# Patient Record
Sex: Female | Born: 1935 | Race: White | Hispanic: No | Marital: Married | Smoking: Never smoker
Health system: Southern US, Community
[De-identification: ages and names within clinical notes are randomized; demographics above are authoritative.]

## PROBLEM LIST (undated history)

## (undated) DIAGNOSIS — M1990 Unspecified osteoarthritis, unspecified site: Secondary | ICD-10-CM

## (undated) DIAGNOSIS — F329 Major depressive disorder, single episode, unspecified: Secondary | ICD-10-CM

## (undated) DIAGNOSIS — M545 Low back pain, unspecified: Secondary | ICD-10-CM

## (undated) DIAGNOSIS — I4891 Unspecified atrial fibrillation: Secondary | ICD-10-CM

## (undated) DIAGNOSIS — R011 Cardiac murmur, unspecified: Secondary | ICD-10-CM

## (undated) DIAGNOSIS — H269 Unspecified cataract: Secondary | ICD-10-CM

## (undated) DIAGNOSIS — D649 Anemia, unspecified: Secondary | ICD-10-CM

## (undated) DIAGNOSIS — I509 Heart failure, unspecified: Secondary | ICD-10-CM

## (undated) DIAGNOSIS — F32A Depression, unspecified: Secondary | ICD-10-CM

## (undated) DIAGNOSIS — Z5189 Encounter for other specified aftercare: Secondary | ICD-10-CM

## (undated) HISTORY — PX: FRACTURE SURGERY: SHX138

## (undated) HISTORY — PX: TONSILLECTOMY: SUR1361

## (undated) HISTORY — PX: ROTATOR CUFF REPAIR: SHX139

## (undated) HISTORY — DX: Major depressive disorder, single episode, unspecified: F32.9

## (undated) HISTORY — DX: Depression, unspecified: F32.A

## (undated) HISTORY — PX: APPENDECTOMY: SHX54

## (undated) HISTORY — DX: Cardiac murmur, unspecified: R01.1

## (undated) HISTORY — DX: Anemia, unspecified: D64.9

## (undated) HISTORY — DX: Encounter for other specified aftercare: Z51.89

## (undated) HISTORY — DX: Unspecified cataract: H26.9

## (undated) HISTORY — PX: EYE SURGERY: SHX253

## (undated) HISTORY — PX: CHOLECYSTECTOMY: SHX55

## (undated) HISTORY — DX: Unspecified osteoarthritis, unspecified site: M19.90

## (undated) HISTORY — DX: Heart failure, unspecified: I50.9

## (undated) HISTORY — PX: ABDOMINAL HYSTERECTOMY: SHX81

---

## 1999-10-03 ENCOUNTER — Encounter: Admission: RE | Admit: 1999-10-03 | Discharge: 1999-10-03 | Payer: Self-pay | Admitting: Orthopedic Surgery

## 1999-10-03 ENCOUNTER — Encounter: Payer: Self-pay | Admitting: Orthopedic Surgery

## 2000-06-05 ENCOUNTER — Encounter: Payer: Self-pay | Admitting: Specialist

## 2000-06-12 ENCOUNTER — Inpatient Hospital Stay (HOSPITAL_COMMUNITY): Admission: RE | Admit: 2000-06-12 | Discharge: 2000-06-14 | Payer: Self-pay | Admitting: Specialist

## 2000-08-04 ENCOUNTER — Emergency Department (HOSPITAL_COMMUNITY): Admission: EM | Admit: 2000-08-04 | Discharge: 2000-08-04 | Payer: Self-pay | Admitting: Internal Medicine

## 2005-01-17 ENCOUNTER — Emergency Department (HOSPITAL_COMMUNITY): Admission: EM | Admit: 2005-01-17 | Discharge: 2005-01-18 | Payer: Self-pay | Admitting: Emergency Medicine

## 2008-04-15 ENCOUNTER — Emergency Department (HOSPITAL_BASED_OUTPATIENT_CLINIC_OR_DEPARTMENT_OTHER): Admission: EM | Admit: 2008-04-15 | Discharge: 2008-04-15 | Payer: Self-pay | Admitting: Emergency Medicine

## 2008-04-17 ENCOUNTER — Emergency Department (HOSPITAL_BASED_OUTPATIENT_CLINIC_OR_DEPARTMENT_OTHER): Admission: EM | Admit: 2008-04-17 | Discharge: 2008-04-17 | Payer: Self-pay | Admitting: Emergency Medicine

## 2008-05-04 ENCOUNTER — Encounter: Admission: RE | Admit: 2008-05-04 | Discharge: 2008-05-04 | Payer: Self-pay | Admitting: Specialist

## 2008-05-12 ENCOUNTER — Encounter: Admission: RE | Admit: 2008-05-12 | Discharge: 2008-05-12 | Payer: Self-pay | Admitting: Specialist

## 2008-11-15 ENCOUNTER — Inpatient Hospital Stay (HOSPITAL_COMMUNITY): Admission: EM | Admit: 2008-11-15 | Discharge: 2008-11-20 | Payer: Self-pay | Admitting: Emergency Medicine

## 2008-11-15 HISTORY — PX: LEG SURGERY: SHX1003

## 2009-10-14 HISTORY — PX: JOINT REPLACEMENT: SHX530

## 2009-11-13 ENCOUNTER — Inpatient Hospital Stay (HOSPITAL_COMMUNITY): Admission: AD | Admit: 2009-11-13 | Discharge: 2009-11-17 | Payer: Self-pay | Admitting: Orthopedic Surgery

## 2010-12-30 LAB — TYPE AND SCREEN: ABO/RH(D): O POS

## 2010-12-30 LAB — ABO/RH: ABO/RH(D): O POS

## 2010-12-30 LAB — COMPREHENSIVE METABOLIC PANEL
ALT: 15 U/L (ref 0–35)
CO2: 28 mEq/L (ref 19–32)
Calcium: 9.5 mg/dL (ref 8.4–10.5)
Chloride: 105 mEq/L (ref 96–112)
GFR calc non Af Amer: 44 mL/min — ABNORMAL LOW (ref >60)
Glucose, Bld: 103 mg/dL — ABNORMAL HIGH (ref 70–99)
Sodium: 141 mEq/L (ref 135–145)
Total Bilirubin: 0.5 mg/dL (ref 0.3–1.2)

## 2010-12-30 LAB — PROTIME-INR
INR: 1.08 (ref 0.00–1.49)
Prothrombin Time: 23.6 seconds — ABNORMAL HIGH (ref 11.6–15.2)

## 2010-12-30 LAB — APTT: aPTT: 31 seconds (ref 24–37)

## 2010-12-30 LAB — CBC
Hemoglobin: 13 g/dL (ref 12.0–15.0)
MCHC: 33.4 g/dL (ref 30.0–36.0)
MCV: 90.3 fL (ref 78.0–100.0)
RBC: 4.31 MIL/uL (ref 3.87–5.11)
WBC: 7.8 10*3/uL (ref 4.0–10.5)

## 2010-12-30 LAB — URINALYSIS, ROUTINE W REFLEX MICROSCOPIC
Bilirubin Urine: NEGATIVE
Hgb urine dipstick: NEGATIVE
Nitrite: NEGATIVE
Specific Gravity, Urine: 1.012 (ref 1.005–1.030)
pH: 6 (ref 5.0–8.0)

## 2011-01-02 LAB — BASIC METABOLIC PANEL
BUN: 9 mg/dL (ref 6–23)
CO2: 31 mEq/L (ref 19–32)
Calcium: 8.3 mg/dL — ABNORMAL LOW (ref 8.4–10.5)
Calcium: 8.3 mg/dL — ABNORMAL LOW (ref 8.4–10.5)
Calcium: 8.3 mg/dL — ABNORMAL LOW (ref 8.4–10.5)
Chloride: 107 mEq/L (ref 96–112)
Creatinine, Ser: 1.09 mg/dL (ref 0.4–1.2)
GFR calc Af Amer: 58 mL/min — ABNORMAL LOW (ref >60)
GFR calc Af Amer: >60 mL/min (ref >60)
GFR calc Af Amer: >60 mL/min (ref >60)
GFR calc Af Amer: >60 mL/min (ref >60)
GFR calc non Af Amer: 48 mL/min — ABNORMAL LOW (ref >60)
GFR calc non Af Amer: 51 mL/min — ABNORMAL LOW (ref >60)
GFR calc non Af Amer: 51 mL/min — ABNORMAL LOW (ref >60)
GFR calc non Af Amer: 53 mL/min — ABNORMAL LOW (ref >60)
Glucose, Bld: 86 mg/dL (ref 70–99)
Potassium: 4 mEq/L (ref 3.5–5.1)
Potassium: 4.1 mEq/L (ref 3.5–5.1)
Potassium: 4.1 mEq/L (ref 3.5–5.1)
Potassium: 4.2 mEq/L (ref 3.5–5.1)
Sodium: 136 mEq/L (ref 135–145)
Sodium: 136 mEq/L (ref 135–145)
Sodium: 138 mEq/L (ref 135–145)

## 2011-01-02 LAB — CBC
HCT: 25 % — ABNORMAL LOW (ref 36.0–46.0)
HCT: 27.4 % — ABNORMAL LOW (ref 36.0–46.0)
HCT: 30.1 % — ABNORMAL LOW (ref 36.0–46.0)
HCT: 31.2 % — ABNORMAL LOW (ref 36.0–46.0)
Hemoglobin: 10 g/dL — ABNORMAL LOW (ref 12.0–15.0)
Hemoglobin: 10.8 g/dL — ABNORMAL LOW (ref 12.0–15.0)
Hemoglobin: 8.5 g/dL — ABNORMAL LOW (ref 12.0–15.0)
MCHC: 34.1 g/dL (ref 30.0–36.0)
MCV: 91.1 fL (ref 78.0–100.0)
Platelets: 214 10*3/uL (ref 150–400)
RBC: 2.76 MIL/uL — ABNORMAL LOW (ref 3.87–5.11)
RBC: 3.01 MIL/uL — ABNORMAL LOW (ref 3.87–5.11)
RBC: 3.43 MIL/uL — ABNORMAL LOW (ref 3.87–5.11)
RDW: 13.4 % (ref 11.5–15.5)
RDW: 13.8 % (ref 11.5–15.5)
WBC: 7.4 10*3/uL (ref 4.0–10.5)
WBC: 7.9 10*3/uL (ref 4.0–10.5)

## 2011-01-02 LAB — PROTIME-INR
INR: 1.16 (ref 0.00–1.49)
INR: 1.65 — ABNORMAL HIGH (ref 0.00–1.49)
Prothrombin Time: 17.2 seconds — ABNORMAL HIGH (ref 11.6–15.2)

## 2011-01-02 LAB — GLUCOSE, CAPILLARY: Glucose-Capillary: 120 mg/dL — ABNORMAL HIGH (ref 70–99)

## 2011-01-29 LAB — CBC
HCT: 38.7 % (ref 36.0–46.0)
Hemoglobin: 10.7 g/dL — ABNORMAL LOW (ref 12.0–15.0)
Hemoglobin: 12.8 g/dL (ref 12.0–15.0)
MCHC: 33.1 g/dL (ref 30.0–36.0)
MCHC: 33.2 g/dL (ref 30.0–36.0)
MCV: 88.7 fL (ref 78.0–100.0)
Platelets: 243 10*3/uL (ref 150–400)
RBC: 4.36 MIL/uL (ref 3.87–5.11)
RDW: 14.1 % (ref 11.5–15.5)
WBC: 13.1 10*3/uL — ABNORMAL HIGH (ref 4.0–10.5)

## 2011-01-29 LAB — PROTIME-INR
INR: 1.3 (ref 0.00–1.49)
INR: 1.4 (ref 0.00–1.49)
INR: 1.9 — ABNORMAL HIGH (ref 0.00–1.49)
INR: 2.4 — ABNORMAL HIGH (ref 0.00–1.49)
Prothrombin Time: 17.7 seconds — ABNORMAL HIGH (ref 11.6–15.2)
Prothrombin Time: 19.5 seconds — ABNORMAL HIGH (ref 11.6–15.2)
Prothrombin Time: 23.3 seconds — ABNORMAL HIGH (ref 11.6–15.2)
Prothrombin Time: 27.3 seconds — ABNORMAL HIGH (ref 11.6–15.2)

## 2011-01-29 LAB — URINALYSIS, ROUTINE W REFLEX MICROSCOPIC
Bilirubin Urine: NEGATIVE
Glucose, UA: NEGATIVE mg/dL
Hgb urine dipstick: NEGATIVE
Specific Gravity, Urine: 1.023 (ref 1.005–1.030)
Urobilinogen, UA: 0.2 mg/dL (ref 0.0–1.0)
pH: 5.5 (ref 5.0–8.0)

## 2011-01-29 LAB — BASIC METABOLIC PANEL
BUN: 8 mg/dL (ref 6–23)
CO2: 26 mEq/L (ref 19–32)
Calcium: 8.3 mg/dL — ABNORMAL LOW (ref 8.4–10.5)
Chloride: 104 mEq/L (ref 96–112)
Chloride: 104 mEq/L (ref 96–112)
Creatinine, Ser: 0.94 mg/dL (ref 0.4–1.2)
Creatinine, Ser: 1.18 mg/dL (ref 0.4–1.2)
GFR calc Af Amer: 53 mL/min — ABNORMAL LOW (ref >60)
Potassium: 3.9 mEq/L (ref 3.5–5.1)
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
## 2011-03-01 NOTE — Op Note (Signed)
[facility]  Patient:    Diane Mejia, Diane Mejia                      MRN: [id number] Proc. Date: 06/12/00 Adm. Date:  [id number] Disc. Date: [id number] Attending:  Gustavo Lah A                           Operative Report  PREOPERATIVE DIAGNOSES:  Massive rotator cuff tear, left shoulder.  POSTOPERATIVE DIAGNOSES:  Massive rotator cuff tear, left shoulder.  PROCEDURE:  Open rotator cuff repair, distal clavicle resection.  INDICATIONS FOR PROCEDURE:  This is a 77 year old female whose had a long standing rotator cuff tear and AC arthrosis. This was confirmed with MRI, disabling pain and dysfunction. Operative intervention was indicated for decompression in the subacromial joint, by distal resection and acromioplasty and repair of the rotator cuff tendon. This will likely require suture anchoring. The risks and benefits of the procedure were discussed including bleeding, infection, injury to neurovascular structures, recurrent tear, adhesive capsulitis, suboptimal range of motion, persistent pain, infection, need for hemiarthroplasty in the future, etc.  TECHNIQUE:  The patient supine in beach chair position after induction of adequate anesthesia and 1 gm of Kefzol IV for antimicrobial prophylaxis, the left shoulder and upper extremity was prepped and draped in the usual sterile fashion. An anterior incision over the mid point of the acromion was made along its line through the skin only and the subcutaneous tissue was dissected, electrocautery was utilized to achieve hemostasis. A raphe between the anterior and lateral heads of the deltoid was identified and developed anteriorly over the acromion approximately 3 cm over to the acromioclavicular joint. The capsule of the [facility] joint was identified, the capsule was divided and the distal end of the clavicle was skeletonized utilizing the AO elevator. Small retractors were utilized to retract beneath the distal  clavicle protecting the rotator cuff. It was significantly degenerated with inferior spurs. An oscillating saw was utilized to remove the distal centimeter of the clavicle with a bear rongeur utilized to complete the contour in the iliodistal clavicle. There is no residual impingement on the rotator cuff noted. The meniscus of the [facility] joint had been removed. Next, attention was turned towards the inferior aspect of the acromion. An inferior spur was noted here to and with a retractor protecting the rotator cuff, an oscillating saw was utilized to perform an anterior acromioplasty removing approximately 3 mm of the anterior surface of the acromion flush with the anterior aspect of the clavicle. This was after the CA ligament was detached. Inspection of the subacromial joint revealed hypertrophic bursa which was excised beneath which was noted a large fragmented retracted tear of the rotator cuff including the supraspinatus and infraspinatus that was retracted towards the glenohumeral joint. The glenohumeral joint was copiously irrigated as was the subacromial joint. Allis clamps were placed on the bleeding edge of the rotator cuff and the rotator cuff was digitally mobilized freeing it from anterior, posterior and medial adhesions. It was advanced to the lateral aspect of the humerus. Due to significant retraction and the paucity of the rotator cuff tendon, abduction of the extremity was required to provide satisfactory attachment of the rotator cuff tendon. The bleeding edge of the rotator cuff was debrided of good bleeding tissue. Next, a trough was made just medial to the greater tuberosity with a bear rongeur to good bleeding cancellous bone. A portion of the lateral  aspect of the articular surface had to be removed as well to provided an adequate bed for attachment to the rotator cuff tendon. The rotator cuff tendon was then advanced into the bed. This required three Mitek suture anchors  to secure the rotator cuff. These were impacted into the bone with excellent purchase. Pulling on the sutures produced no dislodgment of the anchor sutures. It was felt they were satisfactorily anchored into the bone. The rotator cuff was then advanced and then tied into the suture anchors with excellent surgical knots. Next, the rotator cuff and some residual stump was oversewn with #1 Vicryl interrupted figure-of-eight suture. Again this required abduction of the extremity to approximately 60 degrees. This took attention off the rotator cuff. There was no residual impingement upon the rotator cuff noted. The wound was copiously irrigated and water tight closure was obtained. Next, the capsule over the [facility] joint was repaired with #1 Vicryl in figure-of-eight sutures. The raphe was repaired with #1 Vicryl in interrupted figure-of-eight sutures through the raphe and on top of the acromion and repaired to the acromion with a #1 Vicryl interrupted figure-of-eight sutures. Excellent repair of the raphe was noted. The subcutaneous tissue was reapproximated with 2-0 Vicryl simple sutures, the skin was reapproximated with 4-0 subcuticular PDS. The wound was reinforced with Steri-Strips. The wound was dressed sterilely with the arm held in abduction and a large abduction pillow was placed and the arm was secured to this. This was held out in abduction at all times. Next, the patient was awakened without difficulty and transported to the recovery room in satisfactory condition.  The patient tolerated the procedure well with no complications. DD:  08/16/00 TD:  08/18/00 Job: [id number]

## 2011-03-01 NOTE — Discharge Summary (Signed)
[facility]  Patient:    Diane Mejia, Diane Mejia                      MRN: [id number] Adm. Date:  [id number] Disc. Date: [id number] Attending:  Pierce Crane Dictator:   Alexzandrew L. Perkins, P.A.-C.                           Discharge Summary  ADMISSION DIAGNOSES: 1. Torn rotator cuff. 2. Acromioclavicular arthrosis. 3. Hiatal hernia. 4. Remote history of asthma. 5. Postmenopausal. 6. Chronic bronchitis. 7. Kidney failure secondary to transfusion. 8. Rheumatic fever. 9. Heart murmur.  DISCHARGE DIAGNOSES: 1. Torn rotator cuff tear with acromioclavicular arthrosis status post open    repair rotator cuff with distal clavicle resection. 2. Hiatal hernia. 3. Remote history of asthma. 4. Postmenopausal. 5. Chronic bronchitis. 6. Kidney failure secondary to transfusion. 7. Rheumatic fever. 8. Heart murmur.  PROCEDURE:  The patient was taken to the OR on June 12, 2000, underwent an open repair of the torn rotator cuff with a distal clavicle resection.  SURGEON:  Dr. Jene Every.  ASSISTANT:  Avel Peace, PA-C.  ANESTHESIA:  General.  CONSULTS:  None.  ADMISSION HISTORY:  The patient is a 77 year old female followed by Dr. Jene Every for left shoulder pain.  She has been seen and evaluated and sent for an MRI which showed a full-thickness tear of the supraspinatus tendon as well as AC arthrosis, impingement secondary to clavicular pathology.  It was felt she would benefit from undergoing surgical intervention.  Risks and benefits of the procedure were discussed with the patient.  She elected to proceed with surgery.  LABORATORY AND X-RAY DATA:  H & H on admission showed hemoglobin of 12.8, hematocrit of 38.6.  PT/PTT on admission were 13.4 and 28 respectively.  BMET on admission:  Slightly elevated potassium of 5.2, elevated glucose of 121. Remaining BMET all within normal limits.  Urinalysis was negative.  Chest x-ray dated  June 05, 2000:  No active disease.  HOSPITAL COURSE:  The patient was [facility] to [facility], taken to OR, and underwent the above-stated procedure without complications.  The patient tolerated procedure well.  Later was transferred to recovery room and then to orthopedic floor to continue postoperative care.  The patient was also placed on p.o. and IV analgesics for pain control following surgery.  She did quite well.  By postoperative day #1, she was doing much better with the pain control.  She was placed into an abduction brace.  Occupational therapy was consulted to assist with ADLs and also adjustment of the brace.  Once she underwent occupational therapy she was discharged home.  DISCHARGE PLAN/DISPOSITION:  The patient is discharged home on June 13, 2000.  DISCHARGE MEDICATIONS: 1. Vicodin p.r.n. pain. 2. Vitamin C 500 mg p.o. q.d. over-the-counter. 3. Robaxin p.r.n. spasm.  DISCHARGE INSTRUCTIONS:  Diet:  As tolerated.  Activity:  Up as tolerated. Keep abduction pillow on at all times.  FOLLOWUP:  Follow up in 10-14 days following surgery.  Call the office for an appointment.  CONDITION ON DISCHARGE:  Improved. DD:  07/23/00 TD:  07/25/00 Job: [id number]

## 2011-06-05 ENCOUNTER — Other Ambulatory Visit (HOSPITAL_COMMUNITY): Payer: Self-pay | Admitting: Physical Medicine and Rehabilitation

## 2011-06-05 DIAGNOSIS — M25559 Pain in unspecified hip: Secondary | ICD-10-CM

## 2011-06-14 ENCOUNTER — Encounter (HOSPITAL_COMMUNITY): Payer: Self-pay

## 2011-06-14 ENCOUNTER — Encounter (HOSPITAL_COMMUNITY)
Admission: RE | Admit: 2011-06-14 | Discharge: 2011-06-14 | Disposition: A | Discharge status: Home / Self Care | Payer: Medicare Other | Source: Ambulatory Visit | Attending: Physical Medicine and Rehabilitation | Admitting: Physical Medicine and Rehabilitation

## 2011-06-14 DIAGNOSIS — M545 Low back pain, unspecified: Secondary | ICD-10-CM | POA: Insufficient documentation

## 2011-06-14 DIAGNOSIS — Z96659 Presence of unspecified artificial knee joint: Secondary | ICD-10-CM | POA: Insufficient documentation

## 2011-06-14 DIAGNOSIS — M4180 Other forms of scoliosis, site unspecified: Secondary | ICD-10-CM | POA: Insufficient documentation

## 2011-06-14 DIAGNOSIS — M47817 Spondylosis without myelopathy or radiculopathy, lumbosacral region: Secondary | ICD-10-CM | POA: Insufficient documentation

## 2011-06-14 DIAGNOSIS — M25559 Pain in unspecified hip: Secondary | ICD-10-CM

## 2011-06-14 HISTORY — DX: Low back pain: M54.5

## 2011-06-14 HISTORY — DX: Low back pain, unspecified: M54.50

## 2011-06-14 MED ORDER — TECHNETIUM TC 99M MEDRONATE IV KIT
24.0000 | PACK | Freq: Once | INTRAVENOUS | Status: AC | PRN
  Administered 2011-06-14: 24 via INTRAVENOUS

## 2011-11-14 DIAGNOSIS — I4891 Unspecified atrial fibrillation: Secondary | ICD-10-CM | POA: Diagnosis not present

## 2011-11-29 ENCOUNTER — Encounter (HOSPITAL_COMMUNITY): Payer: Self-pay | Admitting: Emergency Medicine

## 2011-11-29 ENCOUNTER — Observation Stay (HOSPITAL_COMMUNITY)
Admission: EM | Admit: 2011-11-29 | Discharge: 2011-12-01 | Disposition: A | Discharge status: Home / Self Care | Payer: Medicare Other | Attending: Internal Medicine | Admitting: Internal Medicine

## 2011-11-29 ENCOUNTER — Ambulatory Visit (INDEPENDENT_AMBULATORY_CARE_PROVIDER_SITE_OTHER): Payer: Medicare Other | Admitting: Family Medicine

## 2011-11-29 VITALS — BP 90/54 | HR 91 | Temp 97.4°F | Resp 22 | Ht 62.5 in | Wt 162.0 lb

## 2011-11-29 DIAGNOSIS — D649 Anemia, unspecified: Secondary | ICD-10-CM

## 2011-11-29 DIAGNOSIS — R059 Cough, unspecified: Secondary | ICD-10-CM

## 2011-11-29 DIAGNOSIS — I10 Essential (primary) hypertension: Secondary | ICD-10-CM | POA: Insufficient documentation

## 2011-11-29 DIAGNOSIS — R5381 Other malaise: Secondary | ICD-10-CM | POA: Diagnosis not present

## 2011-11-29 DIAGNOSIS — N390 Urinary tract infection, site not specified: Secondary | ICD-10-CM | POA: Insufficient documentation

## 2011-11-29 DIAGNOSIS — R0989 Other specified symptoms and signs involving the circulatory and respiratory systems: Secondary | ICD-10-CM | POA: Insufficient documentation

## 2011-11-29 DIAGNOSIS — I951 Orthostatic hypotension: Secondary | ICD-10-CM

## 2011-11-29 DIAGNOSIS — I4891 Unspecified atrial fibrillation: Secondary | ICD-10-CM | POA: Insufficient documentation

## 2011-11-29 DIAGNOSIS — R05 Cough: Secondary | ICD-10-CM

## 2011-11-29 DIAGNOSIS — F3289 Other specified depressive episodes: Secondary | ICD-10-CM | POA: Diagnosis not present

## 2011-11-29 DIAGNOSIS — M549 Dorsalgia, unspecified: Secondary | ICD-10-CM

## 2011-11-29 DIAGNOSIS — Z79899 Other long term (current) drug therapy: Secondary | ICD-10-CM | POA: Diagnosis not present

## 2011-11-29 DIAGNOSIS — Z7901 Long term (current) use of anticoagulants: Secondary | ICD-10-CM | POA: Diagnosis not present

## 2011-11-29 DIAGNOSIS — F329 Major depressive disorder, single episode, unspecified: Secondary | ICD-10-CM

## 2011-11-29 DIAGNOSIS — D509 Iron deficiency anemia, unspecified: Principal | ICD-10-CM | POA: Insufficient documentation

## 2011-11-29 DIAGNOSIS — R0609 Other forms of dyspnea: Secondary | ICD-10-CM | POA: Insufficient documentation

## 2011-11-29 DIAGNOSIS — F341 Dysthymic disorder: Secondary | ICD-10-CM | POA: Diagnosis not present

## 2011-11-29 DIAGNOSIS — R0602 Shortness of breath: Secondary | ICD-10-CM | POA: Diagnosis not present

## 2011-11-29 DIAGNOSIS — I498 Other specified cardiac arrhythmias: Secondary | ICD-10-CM | POA: Insufficient documentation

## 2011-11-29 DIAGNOSIS — R531 Weakness: Secondary | ICD-10-CM | POA: Diagnosis present

## 2011-11-29 DIAGNOSIS — F32A Depression, unspecified: Secondary | ICD-10-CM

## 2011-11-29 DIAGNOSIS — K219 Gastro-esophageal reflux disease without esophagitis: Secondary | ICD-10-CM | POA: Insufficient documentation

## 2011-11-29 HISTORY — DX: Unspecified atrial fibrillation: I48.91

## 2011-11-29 LAB — URINE MICROSCOPIC-ADD ON

## 2011-11-29 LAB — TSH: TSH: 1.684 u[IU]/mL (ref 0.350–4.500)

## 2011-11-29 LAB — URINALYSIS, ROUTINE W REFLEX MICROSCOPIC
Bilirubin Urine: NEGATIVE
Hgb urine dipstick: NEGATIVE
Nitrite: NEGATIVE
Protein, ur: NEGATIVE mg/dL
Specific Gravity, Urine: 1.021 (ref 1.005–1.030)
Urobilinogen, UA: 0.2 mg/dL (ref 0.0–1.0)

## 2011-11-29 LAB — COMPREHENSIVE METABOLIC PANEL
ALT: 9 U/L (ref 0–35)
AST: 15 U/L (ref 0–37)
Albumin: 3.9 g/dL (ref 3.5–5.2)
Alkaline Phosphatase: 61 U/L (ref 39–117)
BUN: 16 mg/dL (ref 6–23)
CO2: 26 mEq/L (ref 19–32)
Calcium: 9 mg/dL (ref 8.4–10.5)
Chloride: 105 mEq/L (ref 96–112)
Creat: 1.13 mg/dL — ABNORMAL HIGH (ref 0.50–1.10)
Glucose, Bld: 101 mg/dL — ABNORMAL HIGH (ref 70–99)
Potassium: 4.2 mEq/L (ref 3.5–5.3)
Sodium: 139 mEq/L (ref 135–145)
Total Bilirubin: 0.5 mg/dL (ref 0.3–1.2)
Total Protein: 6.5 g/dL (ref 6.0–8.3)

## 2011-11-29 LAB — POCT CBC
Granulocyte percent: 63.5 %G (ref 37–80)
HCT, POC: 25.3 % — AB (ref 37.7–47.9)
Hemoglobin: 7.2 g/dL — AB (ref 12.2–16.2)
Lymph, poc: 2.2 (ref 0.6–3.4)
MCH, POC: 21.1 pg — AB (ref 27–31.2)
MCHC: 28.5 g/dL — AB (ref 31.8–35.4)
MCV: 74.2 fL — AB (ref 80–97)
MID (cbc): 0.5 (ref 0–0.9)
MPV: 8.2 fL (ref 0–99.8)
POC Granulocyte: 4.8 (ref 2–6.9)
POC LYMPH PERCENT: 29.9 %L (ref 10–50)
POC MID %: 6.6 %M (ref 0–12)
Platelet Count, POC: 573 10*3/uL — AB (ref 142–424)
RBC: 3.41 M/uL — AB (ref 4.04–5.48)
RDW, POC: 18.7 %
WBC: 7.5 10*3/uL (ref 4.6–10.2)

## 2011-11-29 LAB — DIFFERENTIAL
Basophils Absolute: 0.1 10*3/uL (ref 0.0–0.1)
Basophils Relative: 1 % (ref 0–1)
Eosinophils Relative: 6 % — ABNORMAL HIGH (ref 0–5)
Lymphocytes Relative: 20 % (ref 12–46)
Neutro Abs: 4.8 10*3/uL (ref 1.7–7.7)

## 2011-11-29 LAB — PROTIME-INR
INR: 2.18 — ABNORMAL HIGH (ref 0.00–1.49)
Prothrombin Time: 24.6 seconds — ABNORMAL HIGH (ref 11.6–15.2)

## 2011-11-29 LAB — RETICULOCYTES
RBC.: 3.07 MIL/uL — ABNORMAL LOW (ref 3.87–5.11)
Retic Count, Absolute: 73.7 10*3/uL (ref 19.0–186.0)

## 2011-11-29 LAB — CBC
MCHC: 29.7 g/dL — ABNORMAL LOW (ref 30.0–36.0)
Platelets: 494 10*3/uL — ABNORMAL HIGH (ref 150–400)
RDW: 16.7 % — ABNORMAL HIGH (ref 11.5–15.5)
WBC: 7.3 10*3/uL (ref 4.0–10.5)

## 2011-11-29 LAB — OCCULT BLOOD, POC DEVICE: Fecal Occult Bld: NEGATIVE

## 2011-11-29 LAB — IRON AND TIBC
Iron: 11 ug/dL — ABNORMAL LOW (ref 42–135)
UIBC: 393 ug/dL (ref 125–400)

## 2011-11-29 LAB — BASIC METABOLIC PANEL
CO2: 27 mEq/L (ref 19–32)
Calcium: 9.4 mg/dL (ref 8.4–10.5)
Chloride: 102 mEq/L (ref 96–112)
GFR calc Af Amer: 52 mL/min — ABNORMAL LOW (ref >90)
Sodium: 138 mEq/L (ref 135–145)

## 2011-11-29 LAB — FOLATE: Folate: >20 ng/mL

## 2011-11-29 LAB — POCT SEDIMENTATION RATE: POCT SED RATE: 62 mm/hr — AB (ref 0–22)

## 2011-11-29 LAB — PREPARE RBC (CROSSMATCH)

## 2011-11-29 MED ORDER — ONDANSETRON HCL 4 MG PO TABS: 4.0000 mg | ORAL_TABLET | Freq: Four times a day (QID) | ORAL | Status: DC | PRN

## 2011-11-29 MED ORDER — SODIUM CHLORIDE 0.9 % IV SOLN: 250.0000 mL | INTRAVENOUS | Status: DC | PRN

## 2011-11-29 MED ORDER — POLYETHYLENE GLYCOL 3350 17 G PO PACK
17.0000 g | PACK | Freq: Every day | ORAL | Status: DC | PRN
  Filled 2011-11-29: qty 1

## 2011-11-29 MED ORDER — SODIUM CHLORIDE 0.9 % IJ SOLN
3.0000 mL | Freq: Two times a day (BID) | INTRAMUSCULAR | Status: DC
  Administered 2011-11-30 – 2011-12-01 (×2): 3 mL via INTRAVENOUS

## 2011-11-29 MED ORDER — DILTIAZEM HCL 90 MG PO TABS: 90.0000 mg | ORAL_TABLET | Freq: Every day | ORAL | Status: DC

## 2011-11-29 MED ORDER — CHOLECALCIFEROL 10 MCG (400 UNIT) PO TABS
400.0000 [IU] | ORAL_TABLET | Freq: Every day | ORAL | Status: DC
  Administered 2011-11-30 – 2011-12-01 (×2): 400 [IU] via ORAL
  Filled 2011-11-29 (×2): qty 1

## 2011-11-29 MED ORDER — HYDROCODONE-ACETAMINOPHEN 5-325 MG PO TABS: 1.0000 | ORAL_TABLET | ORAL | Status: DC | PRN

## 2011-11-29 MED ORDER — DILTIAZEM HCL 90 MG PO TABS
90.0000 mg | ORAL_TABLET | Freq: Every day | ORAL | Status: DC
  Administered 2011-11-30 – 2011-12-01 (×2): 90 mg via ORAL
  Filled 2011-11-29 (×2): qty 1

## 2011-11-29 MED ORDER — ONDANSETRON HCL 4 MG/2ML IJ SOLN: 4.0000 mg | Freq: Four times a day (QID) | INTRAMUSCULAR | Status: DC | PRN

## 2011-11-29 MED ORDER — HYDROCODONE-HOMATROPINE 5-1.5 MG/5ML PO SYRP: 5.0000 mL | ORAL_SOLUTION | Freq: Four times a day (QID) | ORAL | Status: DC | PRN

## 2011-11-29 MED ORDER — WARFARIN SODIUM 6 MG PO TABS
6.0000 mg | ORAL_TABLET | ORAL | Status: DC
  Administered 2011-11-30 (×2): 6 mg via ORAL
  Filled 2011-11-29 (×3): qty 1

## 2011-11-29 MED ORDER — WARFARIN SODIUM 6 MG PO TABS: 6.0000 mg | ORAL_TABLET | ORAL | Status: DC

## 2011-11-29 MED ORDER — SODIUM CHLORIDE 0.9 % IJ SOLN: 3.0000 mL | INTRAMUSCULAR | Status: DC | PRN

## 2011-11-29 MED ORDER — MIRTAZAPINE 7.5 MG PO TABS: 7.5000 mg | ORAL_TABLET | Freq: Every day | ORAL | Status: DC

## 2011-11-29 MED ORDER — WARFARIN SODIUM 3 MG PO TABS: 3.0000 mg | ORAL_TABLET | ORAL | Status: DC

## 2011-11-29 MED ORDER — ACETAMINOPHEN 325 MG PO TABS: 650.0000 mg | ORAL_TABLET | Freq: Four times a day (QID) | ORAL | Status: DC | PRN

## 2011-11-29 MED ORDER — FERROUS FUMARATE 325 (106 FE) MG PO TABS
1.0000 | ORAL_TABLET | Freq: Every day | ORAL | Status: DC
  Administered 2011-11-30 – 2011-12-01 (×2): 106 mg via ORAL
  Filled 2011-11-29 (×2): qty 1

## 2011-11-29 MED ORDER — SODIUM CHLORIDE 0.9 % IJ SOLN
3.0000 mL | Freq: Two times a day (BID) | INTRAMUSCULAR | Status: DC
  Administered 2011-11-30: 3 mL via INTRAVENOUS

## 2011-11-29 MED ORDER — MAGNESIUM OXIDE 400 MG PO TABS
200.0000 mg | ORAL_TABLET | Freq: Every day | ORAL | Status: DC
  Administered 2011-11-30 – 2011-12-01 (×2): 200 mg via ORAL
  Filled 2011-11-29 (×2): qty 0.5

## 2011-11-29 MED ORDER — MAGNESIUM 30 MG PO TABS: 30.0000 mg | ORAL_TABLET | Freq: Every day | ORAL | Status: DC

## 2011-11-29 MED ORDER — MIRTAZAPINE 7.5 MG PO TABS
7.5000 mg | ORAL_TABLET | Freq: Every day | ORAL | Status: DC
  Administered 2011-11-30 (×2): 7.5 mg via ORAL
  Filled 2011-11-29 (×3): qty 1

## 2011-11-29 MED ORDER — ADULT MULTIVITAMIN W/MINERALS CH
1.0000 | ORAL_TABLET | Freq: Every day | ORAL | Status: DC
  Administered 2011-11-30 – 2011-12-01 (×2): 1 via ORAL
  Filled 2011-11-29 (×2): qty 1

## 2011-11-29 MED ORDER — ACETAMINOPHEN 650 MG RE SUPP: 650.0000 mg | Freq: Four times a day (QID) | RECTAL | Status: DC | PRN

## 2011-11-29 MED ORDER — POTASSIUM CHLORIDE ER 8 MEQ PO TBCR
8.0000 meq | EXTENDED_RELEASE_TABLET | Freq: Once | ORAL | Status: AC
  Administered 2011-11-30: 8 meq via ORAL
  Filled 2011-11-29: qty 1

## 2011-11-29 NOTE — Progress Notes (Addendum)
78 yo woman with persistent back pain and, more concerning, orthostatic hypotension.  Cardiologist (Dr. Claris Gower) performed tilt test which was positive.  Back pain is treated by Dr. Lequita Halt.    Weakness has been persistent since September when she had a steroid injection.  It is persistent and worse when standing.  She has known spinal stenosis with scoliosis.  Associated with nausea.  Just feels wiped out and is very frustrated. Very depressed.  Appetite is diminished.  Not sleeping well.  O:  NAD, pale and appears depressed. HEENT:  Unremarkable Chest clear Heart reg, no Murmur or gallop Ext:  No sig edema Skin dry and warm BP 110/60 lying and sitting, 100/60 when standing  A:  Orthostatic hypotension, depression  P: check labs Reduce diltiazem to one at hs Remeron 7.5 qhs  Results for orders placed in visit on 11/29/11  POCT CBC      Component Value Range   WBC 7.5  4.6 - 10.2 (K/uL)   Lymph, poc 2.2  0.6 - 3.4    POC LYMPH PERCENT 29.9  10 - 50 (%L)   MID (cbc) 0.5  0 - 0.9    POC MID % 6.6  0 - 12 (%M)   POC Granulocyte 4.8  2 - 6.9    Granulocyte percent 63.5  37 - 80 (%G)   RBC 3.41 (*) 4.04 - 5.48 (M/uL)   Hemoglobin 7.2 (*) 12.2 - 16.2 (g/dL)   HCT, POC 11.9 (*) 14.7 - 47.9 (%)   MCV 74.2 (*) 80 - 97 (fL)   MCH, POC 21.1 (*) 27 - 31.2 (pg)   MCHC 28.5 (*) 31.8 - 35.4 (g/dL)   RDW, POC 82.9     Platelet Count, POC 573 (*) 142 - 424 (K/uL)   MPV 8.2  0 - 99.8 (fL)  POCT SEDIMENTATION RATE      Component Value Range   POCT SED RATE 62 (*) 0 - 22 (mm/hr)   Assessment:  This woman has chronic anemia but this is lower than usual by far.  The elevated sed rate and feeling horrible suggest that perhaps she has polymyalgia rheumatica.  At any rate, I directed her to [facility] ED for transfusion and further eval, and I spoke with the triage nurse there.

## 2011-11-29 NOTE — ED Provider Notes (Signed)
History     CSN: [id number]  Arrival date & time 11/29/11  1557   First MD Initiated Contact with Patient 11/29/11 1606      No chief complaint on file.   (Consider location/radiation/quality/duration/timing/severity/associated sxs/prior treatment) HPI Patient presents to the emergency room with complaints of weakness for the last few months. She states she feels like it all started back in September after she had some spinal steroid injections.  Patient has not had any chest pain, shortness of breath, fevers or chills. She states fatigue and weakness is all over and is general in nature. Patient went to an urgent care today and head blood tests that showed she was anemic. Patient has history of anemia but normally her hemoglobin she states is in the 10 or 11 range. Today at the urgent care her hemoglobin was down to 7. She was sent to the hospital for further treatment and blood transfusions. Past Medical History  Diagnosis Date  . Low back pain     No past surgical history on file.  No family history on file.  History  Substance Use Topics  . Smoking status: Never Smoker   . Smokeless tobacco: Not on file  . Alcohol Use: No    OB History    Grav Para Term Preterm Abortions TAB SAB Ect Mult Living                  Review of Systems  Respiratory: Negative for chest tightness and shortness of breath.   Gastrointestinal: Negative for blood in stool and anal bleeding.  All other systems reviewed and are negative.    Allergies  Sulfa antibiotics  Home Medications   Current Outpatient Rx  Name Route Sig Dispense Refill  . CHOLECALCIFEROL 400 UNITS PO TABS Oral Take 400 Units by mouth daily.    Marland Kitchen DILTIAZEM HCL 90 MG PO TABS Oral Take 1 tablet (90 mg total) by mouth daily. 180 tablet 6  . FERROUS FUMARATE 325 (106 FE) MG PO TABS Oral Take 1 tablet by mouth daily.    Marland Kitchen HYDROCODONE-HOMATROPINE 5-1.5 MG/5ML PO SYRP Oral Take 5 mLs by mouth every 6 (six) hours as needed for  cough. 120 mL 0  . MAGNESIUM 30 MG PO TABS Oral Take 30 mg by mouth daily.    Marland Kitchen MIRTAZAPINE 7.5 MG PO TABS Oral Take 1 tablet (7.5 mg total) by mouth at bedtime. 30 tablet 6  . ONE-DAILY MULTI VITAMINS PO TABS Oral Take 1 tablet by mouth daily.    Marland Kitchen POTASSIUM CHLORIDE ER 8 MEQ PO TBCR Oral Take 8 mEq by mouth as needed.    . WARFARIN SODIUM 6 MG PO TABS Oral Take 6 mg by mouth as directed.      BP 129/50  Pulse 84  Temp(Src) 97.7 F (36.5 C) (Oral)  Resp 16  SpO2 98%  Physical Exam  Nursing note and vitals reviewed. Constitutional: She appears well-developed and well-nourished. No distress.  HENT:  Head: Normocephalic and atraumatic.  Right Ear: External ear normal.  Left Ear: External ear normal.       Conjunctiva pale  Eyes: Right eye exhibits no discharge. Left eye exhibits no discharge. No scleral icterus.  Neck: Neck supple. No tracheal deviation present.  Cardiovascular: Normal rate, regular rhythm and intact distal pulses.   Pulmonary/Chest: Effort normal and breath sounds normal. No stridor. No respiratory distress. She has no wheezes. She has no rales.  Abdominal: Soft. Bowel sounds are normal. She exhibits no  distension. There is no tenderness. There is no rebound and no guarding.  Genitourinary: Rectal exam shows no mass and anal tone normal. Guaiac negative stool.  Musculoskeletal: She exhibits no edema and no tenderness.  Neurological: She is alert. She has normal strength. No sensory deficit. Cranial nerve deficit:  no gross defecits noted. She exhibits normal muscle tone. She displays no seizure activity. Coordination normal.  Skin: Skin is warm and dry. No rash noted.  Psychiatric: She has a normal mood and affect.    ED Course  Procedures (including critical care time)   Labs Reviewed  CBC - Abnormal; Notable for the following:    RBC 3.06 (*)    Hemoglobin 7.0 (*)    HCT 23.6 (*)    MCV 77.1 (*)    MCH 22.9 (*)    MCHC 29.7 (*)    RDW 16.7 (*)     Platelets 494 (*)    All other components within normal limits  DIFFERENTIAL - Abnormal; Notable for the following:    Eosinophils Relative 6 (*)    All other components within normal limits  BASIC METABOLIC PANEL - Abnormal; Notable for the following:    Glucose, Bld 127 (*)    GFR calc non Af Amer 45 (*)    GFR calc Af Amer 52 (*)    All other components within normal limits  PROTIME-INR - Abnormal; Notable for the following:    Prothrombin Time 24.6 (*) COUMADIN THERAPY   INR 2.18 (*)    All other components within normal limits  TYPE AND SCREEN  OCCULT BLOOD, POC DEVICE  URINALYSIS, ROUTINE W REFLEX MICROSCOPIC  PREPARE RBC (CROSSMATCH)   No results found.   1. Anemia       MDM  Patient appears to have worsening symptomatic anemia. At this time there is no evidence of acute gastrointestinal bleeding. She remains hemodynamically stable. I have ordered a blood transfusion and we'll consult the hospitalist for admission and further evaluation.  CRITICAL CARE Performed by: Celene Kras   Total critical care time: 30  Critical care time was exclusive of separately billable procedures and treating other patients.  Critical care was necessary to treat or prevent imminent or life-threatening deterioration.  Critical care was time spent personally by me on the following activities: development of treatment plan with patient and/or surrogate as well as nursing, discussions with consultants, evaluation of patient's response to treatment, examination of patient, obtaining history from patient or surrogate, ordering and performing treatments and interventions, ordering and review of laboratory studies, ordering and review of radiographic studies, pulse oximetry and re-evaluation of patient's condition.        Celene Kras, MD 11/29/11 [phone]

## 2011-11-29 NOTE — H&P (Addendum)
Hospital Admission Note Date: 11/29/2011  PCP: Elvina Sidle, MD, MD  Chief Complaint:Dyspnea  History of Present Illness:  This is an 78 year-old female with past medical history of osteoarthritis, rheumatic fever at the age of 4, paroxysmal A. fib on chronic Coumadin, postoperative blood loss anemia and a history of iron deficiency anemia that comes in for dyspnea. She relates this started 2 weeks prior to admission. And has progressively been getting worse. She went to her primary care doctor who drew labs. And called her and said that her hemoglobin was low. So she had to come to the EEG. The patient relates that her shortness of breath has progressively been getting worse with exertion. She is also in relating some weakness and loss of energy for the last week. She feels like she just cannot start going. She relates no melena, bright red blood, no hematemesis. She has a gastroenterologist [facility] at [facility], her last colonoscopy was 5 years ago and everything was within normal as per patient. Here in the ED a CBC was drawn and showed a hemoglobin of 7.0 with her last recorded hemoglobin 2011 of 10. So we were asked to admit and further evaluate. Allergies: Sulfa antibiotics Past Medical History  Diagnosis Date  . Low back pain    Prior to Admission medications   Medication Sig Start Date End Date Taking? Authorizing Provider  cholecalciferol (VITAMIN D) 400 UNITS TABS Take 400 Units by mouth daily.   Yes Historical Provider, MD  diltiazem (CARDIZEM) 90 MG tablet Take 1 tablet (90 mg total) by mouth daily. 11/29/11 11/28/12 Yes Elvina Sidle, MD  ferrous fumarate (HEMOCYTE - 106 MG FE) 325 (106 FE) MG TABS Take 1 tablet by mouth daily.   Yes Historical Provider, MD  magnesium 30 MG tablet Take 30 mg by mouth daily.   Yes Historical Provider, MD  Multiple Vitamin (MULTIVITAMIN) tablet Take 1 tablet by mouth daily.   Yes Historical Provider, MD  potassium chloride (KLOR-CON)  8 MEQ tablet Take 8 mEq by mouth as needed.   Yes Historical Provider, MD  warfarin (COUMADIN) 6 MG tablet Take 6 mg by mouth as directed. Takes 6mg  every day except Tuesday then she takes 3mg  on that day.   Yes Historical Provider, MD  HYDROcodone-homatropine (HYDROMET) 5-1.5 MG/5ML syrup Take 5 mLs by mouth every 6 (six) hours as needed for cough. 11/29/11 12/09/11  Elvina Sidle, MD  mirtazapine (REMERON) 7.5 MG tablet Take 1 tablet (7.5 mg total) by mouth at bedtime. 11/29/11 12/29/11  Elvina Sidle, MD   History reviewed. No pertinent past surgical history. History reviewed. No pertinent family history. History   Social History  . Marital Status: Married    Spouse Name: N/A    Number of Children: N/A  . Years of Education: N/A   Occupational History  . Not on file.   Social History Main Topics  . Smoking status: Never Smoker   . Smokeless tobacco: Not on file  . Alcohol Use: No  . Drug Use: No  . Sexually Active: No   Other Topics Concern  . Not on file   Social History Narrative  . No narrative on file    REVIEW OF SYSTEMS:  Constitutional:  No weight loss, night sweats, Fevers, chills, fatigue.  HEENT:  No headaches, Difficulty swallowing,Tooth/dental problems,Sore throat,  No sneezing, itching, ear ache, nasal congestion, post nasal drip,  Cardio-vascular:  No chest pain, Orthopnea, PND, swelling in lower extremities, anasarca, dizziness, palpitations  GI:  No heartburn, indigestion, abdominal pain, nausea, vomiting, diarrhea, change in bowel habits, loss of appetite  Resp:  No shortness of breath with exertion or at rest. No excess mucus, no productive cough, No non-productive cough, No coughing up of blood.No change in color of mucus.No wheezing.No chest wall deformity  Skin:  no rash or lesions.  GU:  no dysuria, change in color of urine, no urgency or frequency. No flank pain.  Musculoskeletal:  No joint pain or swelling. No decreased range of motion. No  back pain.  Psych:  No change in mood or affect. No depression or anxiety. No memory loss.   Physical Exam: Filed Vitals:   11/29/11 1615  BP: 129/50  Pulse: 84  Temp: 97.7 F (36.5 C)  TempSrc: Oral  Resp: 16  SpO2: 98%   No intake or output data in the 24 hours ending 11/29/11 1835 BP 129/50  Pulse 84  Temp(Src) 97.7 F (36.5 C) (Oral)  Resp 16  SpO2 98%  General Appearance:    Alert, cooperative, no distress, appears stated age  Head:    Normocephalic, without obvious abnormality, atraumatic  Eyes:    PERRL, conjunctiva/corneas clear, EOM's intact, fundi    benign, both eyes  Ears:    Normal TM's and external ear canals, both ears  Nose:   Nares normal, septum midline, mucosa normal, no drainage    or sinus tenderness  Throat:   Lips, mucosa, and tongue normal; teeth and gums normal  Neck:   Supple, symmetrical, trachea midline, no adenopathy;    thyroid:  no enlargement/tenderness/nodules; no carotid   bruit or JVD  Back:     Symmetric, no curvature, ROM normal, no CVA tenderness  Lungs:     Clear to auscultation bilaterally, respirations unlabored  Chest Wall:    No tenderness or deformity   Heart:    Regular rate and rhythm, S1 and S2 normal, no murmur, rub   or gallop     Abdomen:     Soft, non-tender, bowel sounds active all four quadrants,    no masses, no organomegaly        Extremities:   Extremities normal, atraumatic, no cyanosis or edema  Pulses:   2+ and symmetric all extremities  Skin:   Skin color, texture, turgor normal, no rashes or lesions  Lymph nodes:   Cervical, supraclavicular, and axillary nodes normal  Neurologic:   CNII-XII intact, normal strength, sensation and reflexes    throughout   Lab results:  Basename 11/29/11 1655  NA 138  K 3.8  CL 102  CO2 27  GLUCOSE 127*  BUN 18  CREATININE 1.08  CALCIUM 9.4  MG --  PHOS --   No results found for this basename: AST:2,ALT:2,ALKPHOS:2,BILITOT:2,PROT:2,ALBUMIN:2 in the last 72  hours No results found for this basename: LIPASE:2,AMYLASE:2 in the last 72 hours  Basename 11/29/11 1655 11/29/11 1407  WBC 7.3 7.5  NEUTROABS 4.8 --  HGB 7.0* 7.2*  HCT 23.6* 25.3*  MCV 77.1* 74.2*  PLT 494* --   No results found for this basename: CKTOTAL:3,CKMB:3,CKMBINDEX:3,TROPONINI:3 in the last 72 hours No components found with this basename: POCBNP:3 No results found for this basename: DDIMER:2 in the last 72 hours No results found for this basename: HGBA1C:2 in the last 72 hours No results found for this basename: CHOL:2,HDL:2,LDLCALC:2,TRIG:2,CHOLHDL:2,LDLDIRECT:2 in the last 72 hours No results found for this basename: TSH,T4TOTAL,FREET3,T3FREE,THYROIDAB in the last 72 hours No results found for this basename: VITAMINB12:2,FOLATE:2,FERRITIN:2,TIBC:2,IRON:2,RETICCTPCT:2 in the last 72 hours  Imaging results:  No results found. Other results: EKG: Marland Kitchen   Patient Active Hospital Problem List:  Dyspnea (11/29/2011) -likely cause of her dyspnea which has progressively been getting worse over 2 weeks is her iron deficiency anemia. We'll check an anemia panel, type and cross 2 units transfuse 1 unit check a CBC, if her symptoms are not resolved, will transfuse her an additional unit and check a CBC afterwards.   Hypertension (11/29/2011)  continue current home meds no changes.  Iron deficiency anemia (11/29/2011) Her MCV is low which is most likely indicative of chronic ongoing loss we'll guaiac her stools. We'll check an anemia panel. She's currently on iron supplements. We'll continue this. I will go ahead and just use her one unit and check a CBC tomorrow morning. She has a gastroenterologist over at [facility], her last colonoscopy was 5 years ago. She can followup with him as an outpatient if she has no obvious signs of bleeding.  Atrial fibrillation (11/29/2011)  rate control continue current meds INR therapeutic hold Coumadin.    Code Status: Full code Family  Communication: Spouse [phone]   Marinda Elk M.D. [facility] [phone] 11/29/2011, 6:35 PM

## 2011-11-29 NOTE — ED Notes (Signed)
MD at bedside. Hospitalist

## 2011-11-29 NOTE — ED Notes (Signed)
Pt attempted to give a urine specimen, but could not void.  Stated that she has not had anything to drink since early this morning.

## 2011-11-29 NOTE — Patient Instructions (Signed)
Recheck in 2 weeks.  Lab tests are pending  Reduce the diltiazem to just one at bedtime once a day  Start the Remeron (mirtazapine)

## 2011-11-29 NOTE — ED Notes (Signed)
Pt admitted to E 1607

## 2011-11-29 NOTE — ED Notes (Addendum)
Pt informed of urinalysis order.  Stated that she can not void at this time.  Also, stated that her husband(an ex-Lab tech) checks her urine at home.

## 2011-11-29 NOTE — ED Notes (Signed)
States has been feeling weak for some time. Went to MD found Hgb 7.0 per pt.

## 2011-11-30 DIAGNOSIS — D649 Anemia, unspecified: Secondary | ICD-10-CM | POA: Diagnosis not present

## 2011-11-30 DIAGNOSIS — I10 Essential (primary) hypertension: Secondary | ICD-10-CM | POA: Diagnosis not present

## 2011-11-30 DIAGNOSIS — R531 Weakness: Secondary | ICD-10-CM | POA: Diagnosis present

## 2011-11-30 DIAGNOSIS — R0602 Shortness of breath: Secondary | ICD-10-CM | POA: Diagnosis not present

## 2011-11-30 DIAGNOSIS — I4891 Unspecified atrial fibrillation: Secondary | ICD-10-CM | POA: Diagnosis not present

## 2011-11-30 LAB — COMPREHENSIVE METABOLIC PANEL
ALT: 7 U/L (ref 0–35)
AST: 17 U/L (ref 0–37)
Alkaline Phosphatase: 62 U/L (ref 39–117)
CO2: 28 mEq/L (ref 19–32)
Calcium: 9 mg/dL (ref 8.4–10.5)
Chloride: 105 mEq/L (ref 96–112)
GFR calc Af Amer: 56 mL/min — ABNORMAL LOW (ref >90)
GFR calc non Af Amer: 48 mL/min — ABNORMAL LOW (ref >90)
Glucose, Bld: 82 mg/dL (ref 70–99)
Potassium: 3.6 mEq/L (ref 3.5–5.1)
Sodium: 139 mEq/L (ref 135–145)

## 2011-11-30 LAB — CBC
Hemoglobin: 7.9 g/dL — ABNORMAL LOW (ref 12.0–15.0)
MCH: 23.5 pg — ABNORMAL LOW (ref 26.0–34.0)
Platelets: 447 10*3/uL — ABNORMAL HIGH (ref 150–400)
RBC: 3.36 MIL/uL — ABNORMAL LOW (ref 3.87–5.11)
WBC: 7.6 10*3/uL (ref 4.0–10.5)

## 2011-11-30 LAB — IRON AND TIBC
Saturation Ratios: 8 % — ABNORMAL LOW (ref 20–55)
TIBC: 368 ug/dL (ref 250–470)

## 2011-11-30 LAB — RETICULOCYTES: RBC.: 3.36 MIL/uL — ABNORMAL LOW (ref 3.87–5.11)

## 2011-11-30 LAB — PROTIME-INR: INR: 2.04 — ABNORMAL HIGH (ref 0.00–1.49)

## 2011-11-30 LAB — FERRITIN: Ferritin: 4 ng/mL — ABNORMAL LOW (ref 10–291)

## 2011-11-30 NOTE — Progress Notes (Signed)
Patient ID: Diane Mejia, female   DOB: [birth year], 78 y.o.   MRN: [id number]  Subjective: No events overnight. Patient denies chest pain, shortness of breath, abdominal pain.   Objective:  Vital signs in last 24 hours:  Filed Vitals:   11/30/11 1243 11/30/11 1330 11/30/11 1530 11/30/11 1700  BP: 115/58 107/71 114/50 117/73  Pulse: 85 77 68 82  Temp: 98 F (36.7 C) 98.1 F (36.7 C) 98.6 F (37 C) 97.8 F (36.6 C)  TempSrc: Oral Oral Oral Oral  Resp: 16 16 16 16   Height:      Weight:      SpO2:        Intake/Output from previous day:   Intake/Output Summary (Last 24 hours) at 11/30/11 2207 Last data filed at 11/30/11 1700  Gross per 24 hour  Intake 1526.33 ml  Output      0 ml  Net 1526.33 ml    Physical Exam: General: Alert, awake, oriented x3, in no acute distress. HEENT: No bruits, no goiter. Moist mucous membranes, no scleral icterus, no conjunctival pallor. Heart: Irregular rate and rhythm, no murmurs, rubs, gallops. Lungs: Clear to auscultation bilaterally. No wheezing, no rhonchi, no rales.  Abdomen: Soft, nontender, nondistended, positive bowel sounds. Extremities: No clubbing or cyanosis, no pitting edema,  positive pedal pulses. Neuro: Grossly nonfocal.  Lab Results:  Basic Metabolic Panel:    Component Value Date/Time   NA 139 11/30/2011 0441   K 3.6 11/30/2011 0441   CL 105 11/30/2011 0441   CO2 28 11/30/2011 0441   BUN 14 11/30/2011 0441   CREATININE 1.02 11/30/2011 0441   CREATININE 1.13* 11/29/2011 1356   GLUCOSE 82 11/30/2011 0441   CALCIUM 9.0 11/30/2011 0441   CBC:    Component Value Date/Time   WBC 7.6 11/30/2011 0441   WBC 7.5 11/29/2011 1407   HGB 8.9* 11/30/2011 1954   HGB 7.2* 11/29/2011 1407   HCT 29.0* 11/30/2011 1954   HCT 25.3* 11/29/2011 1407   PLT 447* 11/30/2011 0441   MCV 78.0 11/30/2011 0441   MCV 74.2* 11/29/2011 1407   NEUTROABS 4.8 11/29/2011 1655   LYMPHSABS 1.5 11/29/2011 1655   MONOABS 0.5 11/29/2011 1655   EOSABS 0.4 11/29/2011  1655   BASOSABS 0.1 11/29/2011 1655      Lab 11/30/11 1954 11/30/11 0441 11/29/11 1655 11/29/11 1407  WBC -- 7.6 7.3 7.5  HGB 8.9* 7.9* 7.0* 7.2*  HCT 29.0* 26.2* 23.6* 25.3*  PLT -- 447* 494* --  MCV -- 78.0 77.1* 74.2*  MCH -- 23.5* 22.9* 21.1*  MCHC -- 30.2 29.7* 28.5*  RDW -- 16.7* 16.7* --  LYMPHSABS -- -- 1.5 --  MONOABS -- -- 0.5 --  EOSABS -- -- 0.4 --  BASOSABS -- -- 0.1 --  BANDABS -- -- -- --    Lab 11/30/11 0441 11/29/11 1655 11/29/11 1356  NA 139 138 139  K 3.6 3.8 4.2  CL 105 102 105  CO2 28 27 26   GLUCOSE 82 127* 101*  BUN 14 18 16   CREATININE 1.02 1.08 1.13*  CALCIUM 9.0 9.4 9.0  MG -- -- --    Lab 11/30/11 0441 11/29/11 1655  INR 2.04* 2.18*  PROTIME -- --   Studies/Results: No results found.  Medications: Scheduled Meds:   . cholecalciferol  400 Units Oral Daily  . diltiazem  90 mg Oral Daily  . ferrous fumarate  1 tablet Oral Daily  . magnesium oxide  200 mg Oral Daily  .  mirtazapine  7.5 mg Oral QHS  . mulitivitamin with minerals  1 tablet Oral Daily  . potassium chloride  8 mEq Oral Once  . sodium chloride  3 mL Intravenous Q12H  . sodium chloride  3 mL Intravenous Q12H  . warfarin  3 mg Oral Custom  . warfarin  6 mg Oral Custom  . DISCONTD: magnesium  30 mg Oral Daily  . DISCONTD: warfarin  6 mg Oral UD   Continuous Infusions:  PRN Meds:.sodium chloride, sodium chloride, acetaminophen, acetaminophen, HYDROcodone-acetaminophen, HYDROcodone-homatropine, ondansetron (ZOFRAN) IV, ondansetron, polyethylene glycol, sodium chloride, sodium chloride  Assessment/Plan:  Principal Problem:  *Weakness - most likely secondary to progressive anemia of unclear etiology - her baseline Hg appears to be 9 -10 and she was admitted with Hg of 7.2 - her was colonoscopy was 5 years ago and she reports it was normal - plan is to proceed with second unit of blood transfusion and obtain follow up CBC - may need GI consult if numbers noted to trend  down  Active Problems:  Hypertension - remains stable    Iron deficiency anemia - please see primary problem   Atrial fibrillation - rate controlled on Cardizem - will continue Coumadin   EDUCATION - test results and diagnostic studies were discussed with patient and pt's family who was present at the bedside (huband) - patient and family have verbalized the understanding - questions were answered at the bedside and contact information was provided for additional questions or concerns   LOS: 1 day   MAGICK-Kayly Kriegel 11/30/2011, 10:07 PM  [facility] Pager: [phone]

## 2011-12-01 DIAGNOSIS — I4891 Unspecified atrial fibrillation: Secondary | ICD-10-CM | POA: Diagnosis not present

## 2011-12-01 DIAGNOSIS — R0602 Shortness of breath: Secondary | ICD-10-CM | POA: Diagnosis not present

## 2011-12-01 DIAGNOSIS — I10 Essential (primary) hypertension: Secondary | ICD-10-CM | POA: Diagnosis not present

## 2011-12-01 DIAGNOSIS — D649 Anemia, unspecified: Secondary | ICD-10-CM | POA: Diagnosis not present

## 2011-12-01 LAB — CBC
HCT: 28.3 % — ABNORMAL LOW (ref 36.0–46.0)
Hemoglobin: 8.7 g/dL — ABNORMAL LOW (ref 12.0–15.0)
MCH: 24.6 pg — ABNORMAL LOW (ref 26.0–34.0)
MCHC: 30.7 g/dL (ref 30.0–36.0)
MCV: 79.9 fL (ref 78.0–100.0)

## 2011-12-01 LAB — PROTIME-INR: Prothrombin Time: 23.2 seconds — ABNORMAL HIGH (ref 11.6–15.2)

## 2011-12-01 LAB — BASIC METABOLIC PANEL
BUN: 20 mg/dL (ref 6–23)
Calcium: 8.4 mg/dL (ref 8.4–10.5)
Creatinine, Ser: 1.09 mg/dL (ref 0.50–1.10)
GFR calc non Af Amer: 45 mL/min — ABNORMAL LOW (ref >90)
Glucose, Bld: 102 mg/dL — ABNORMAL HIGH (ref 70–99)

## 2011-12-01 LAB — URINE MICROSCOPIC-ADD ON

## 2011-12-01 LAB — URINALYSIS, ROUTINE W REFLEX MICROSCOPIC
Protein, ur: NEGATIVE mg/dL
Urobilinogen, UA: 0.2 mg/dL (ref 0.0–1.0)

## 2011-12-01 MED ORDER — CIPROFLOXACIN HCL 250 MG PO TABS: 250.0000 mg | ORAL_TABLET | Freq: Two times a day (BID) | ORAL | Status: AC

## 2011-12-01 MED ORDER — CIPROFLOXACIN HCL 250 MG PO TABS
250.0000 mg | ORAL_TABLET | Freq: Two times a day (BID) | ORAL | Status: DC
  Administered 2011-12-01: 250 mg via ORAL
  Filled 2011-12-01 (×2): qty 1

## 2011-12-01 NOTE — Progress Notes (Signed)
Pt stable, scripts, and d/c instructions given.  No questions/concerns voiced by patient.  Transported via wheelchair to private vehicle with NT and family.

## 2011-12-01 NOTE — Discharge Summary (Signed)
Physician Discharge Summary  Patient ID: Diane Mejia MRN: [id number] DOB/AGE: [birth year] 78 y.o.  Admit date: 11/29/2011 Discharge date: 12/01/2011  Primary Care Physician:  Elvina Sidle, MD, MD  Discharge Diagnoses:   Present on Admission:  .Hypertension .Iron deficiency anemia .Atrial fibrillation .Dyspnea .Weakness Urinary tract infection  Consults:  None   Discharge Medications: Medication List  As of 12/01/2011  2:46 PM   TAKE these medications         cholecalciferol 400 UNITS Tabs   Commonly known as: VITAMIN D   Take 400 Units by mouth daily.      ciprofloxacin 250 MG tablet   Commonly known as: CIPRO   Take 1 tablet (250 mg total) by mouth 2 (two) times daily.      diltiazem 90 MG tablet   Commonly known as: CARDIZEM   Take 1 tablet (90 mg total) by mouth daily.      ferrous fumarate 325 (106 FE) MG Tabs   Commonly known as: HEMOCYTE - 106 mg FE   Take 1 tablet by mouth daily.      HYDROcodone-homatropine 5-1.5 MG/5ML syrup   Commonly known as: HYCODAN   Take 5 mLs by mouth every 6 (six) hours as needed for cough.      magnesium 30 MG tablet   Take 30 mg by mouth daily.      mirtazapine 7.5 MG tablet   Commonly known as: REMERON   Take 1 tablet (7.5 mg total) by mouth at bedtime.      multivitamin tablet   Take 1 tablet by mouth daily.      potassium chloride 8 MEQ tablet   Commonly known as: KLOR-CON   Take 8 mEq by mouth as needed.      warfarin 6 MG tablet   Commonly known as: COUMADIN   Take 6 mg by mouth as directed. Takes 6mg  every day except Tuesday then she takes 3mg  on that day.             Brief H and P: For complete details please refer to admission H and P, but in brief patient is a 78 year old female with past medical history of ostial arthritis, rheumatic fever, paroxysmal A. fib on chronic Coumadin presented to the emergency room with history of dyspnea. Patient stated that this started 2 weeks prior to admission and had  been progressively been getting worse. She also related some generalized weakness, loss of energy for the last 1 week but no melena bright red blood or hematemesis. She has gastroenterologist, Dr. Noelle Penner, at [facility], last colonoscopy was 5 years ago and per patient was normal.  Hospital Course:    *Weakness: Likely secondary to iron deficiency anemia, also patient was noted to have urinary tract infection. - Baseline hemoglobin appears to be 9-10., Patient was transfused and at the time of discharge hemoglobin A1c 8.7. Stool occult test was negative. Anemia panel was checked and showed a significant iron deficiency. I did offer her recommendations for GI consultation. The patient has gastroenterologist, Dr. Noelle Penner at [facility], [place] and preferred to have GI evaluation/workup with Dr. Noelle Penner outpatient. Patient's husband at the bedside also showed that they will followup with Dr. Noelle Penner office on Monday tomorrow to schedule an appointment. Patient was recommended to continue iron supplementation.  UTI: The patient was also found to have dirty urine sample on the admission. She did not have any symptoms of dysuria or hematuria, was started on 3 day course of ciprofloxacin. Urine  culture sample was also obtained prior to the discharge for sensitivities.    Hypertension: Remained stable  Iron deficiency anemia: Management as problem #1, patient was also transfused pRBC's  Atrial fibrillation: Remained stable, patient was continued on Coumadin   Day of Discharge BP 121/68  Pulse 64  Temp(Src) 97.8 F (36.6 C) (Oral)  Resp 16  Ht 5' 2.5" (1.588 m)  Wt 73.483 kg (162 lb)  BMI 29.16 kg/m2  SpO2 96%  Physical Exam: General: Alert and awake oriented x3 not in any acute distress. HEENT: anicteric sclera, pupils reactive to light and accommodation CVS: S1-S2 clear no murmur rubs or gallops Chest: clear to auscultation bilaterally, no wheezing rales or rhonchi Abdomen: soft  nontender, nondistended, normal bowel sounds, no organomegaly Extremities: no cyanosis, clubbing or edema noted bilaterally Neuro: Cranial nerves II-XII intact, no focal neurological deficits   The results of significant diagnostics from this hospitalization (including imaging, microbiology, ancillary and laboratory) are listed below for reference.    LAB RESULTS: Basic Metabolic Panel:  Lab 12/01/11 1610 11/30/11 0441  NA 139 139  K 3.6 3.6  CL 106 105  CO2 28 28  GLUCOSE 102* 82  BUN 20 14  CREATININE 1.09 1.02  CALCIUM 8.4 9.0  MG -- --  PHOS -- --   Liver Function Tests:  Lab 11/30/11 0441 11/29/11 1356  AST 17 15  ALT 7 9  ALKPHOS 62 61  BILITOT 0.5 0.5  PROT 5.8* 6.5  ALBUMIN 3.0* 3.9   CBC:  Lab 12/01/11 0430 11/30/11 1954 11/30/11 0441 11/29/11 1655  WBC 9.1 -- 7.6 --  NEUTROABS -- -- -- 4.8  HGB 8.7* 8.9* -- --  HCT 28.3* 29.0* -- --  MCV 79.9 -- -- --  PLT 447* -- 447* --   Significant Diagnostic Studies:  No results found.   Disposition and Follow-up: Discharge Orders    Future Orders Please Complete By Expires   Diet - low sodium heart healthy      Increase activity slowly          DISPOSITION: Home  DIET: Heart healthy  ACTIVITY:  as tolerated   DISCHARGE FOLLOW-UP Follow-up Information    Follow up with Elvina Sidle, MD. Schedule an appointment as soon as possible for a visit in 7 days. (please get CBC checked )       Follow up with Edwina Barth, MD. Schedule an appointment as soon as possible for a visit in 10 days.   Contact information:   [address] [place] [address] [phone]          Time spent on Discharge: 35 minutes  Signed:  Joslyn Ramos M.D. [facility] 12/01/2011, 2:46 PM

## 2011-12-02 LAB — URINE CULTURE: Colony Count: 40000

## 2011-12-02 NOTE — Progress Notes (Signed)
UR completed 

## 2011-12-03 ENCOUNTER — Telehealth: Payer: Self-pay

## 2011-12-03 LAB — TYPE AND SCREEN
Antibody Screen: NEGATIVE
Unit division: 0
Unit division: 0
Unit division: 0

## 2011-12-03 NOTE — Telephone Encounter (Signed)
Let patient know that the labs per her request are ready for pick up

## 2011-12-03 NOTE — Telephone Encounter (Signed)
.  [facility]    PT REQUESTING COPIES OF ALL RECENT LABS TO BE PICKED UP    BEST PHONE [phone]

## 2011-12-04 DIAGNOSIS — D509 Iron deficiency anemia, unspecified: Secondary | ICD-10-CM | POA: Diagnosis not present

## 2011-12-08 ENCOUNTER — Ambulatory Visit (INDEPENDENT_AMBULATORY_CARE_PROVIDER_SITE_OTHER): Payer: Medicare Other | Admitting: Family Medicine

## 2011-12-08 VITALS — BP 130/64 | HR 84 | Temp 98.0°F | Resp 18 | Ht 63.0 in | Wt 172.0 lb

## 2011-12-08 DIAGNOSIS — R05 Cough: Secondary | ICD-10-CM | POA: Diagnosis not present

## 2011-12-08 DIAGNOSIS — D509 Iron deficiency anemia, unspecified: Secondary | ICD-10-CM

## 2011-12-08 DIAGNOSIS — R059 Cough, unspecified: Secondary | ICD-10-CM

## 2011-12-08 DIAGNOSIS — M353 Polymyalgia rheumatica: Secondary | ICD-10-CM | POA: Insufficient documentation

## 2011-12-08 DIAGNOSIS — D649 Anemia, unspecified: Secondary | ICD-10-CM

## 2011-12-08 LAB — POCT CBC
Granulocyte percent: 63.1 %G (ref 37–80)
HCT, POC: 27.4 % — AB (ref 37.7–47.9)
Hemoglobin: 8.1 g/dL — AB (ref 12.2–16.2)
Lymph, poc: 3.2 (ref 0.6–3.4)
MCH, POC: 23.5 pg — AB (ref 27–31.2)
MCHC: 29.6 g/dL — AB (ref 31.8–35.4)
MCV: 79.3 fL — AB (ref 80–97)
MID (cbc): 0.8 (ref 0–0.9)
MPV: 8.9 fL (ref 0–99.8)
POC Granulocyte: 6.8 (ref 2–6.9)
POC LYMPH PERCENT: 29.7 %L (ref 10–50)
POC MID %: 7.2 %M (ref 0–12)
Platelet Count, POC: 381 10*3/uL (ref 142–424)
RBC: 3.45 M/uL — AB (ref 4.04–5.48)
RDW, POC: 20.5 %
WBC: 10.7 10*3/uL — AB (ref 4.6–10.2)

## 2011-12-08 MED ORDER — HYDROCOD POLST-CHLORPHEN POLST 10-8 MG/5ML PO LQCR: 5.0000 mL | Freq: Two times a day (BID) | ORAL | Status: DC | PRN

## 2011-12-08 NOTE — Progress Notes (Signed)
77 yo woman with cough and anemia following transfusion in emergency room recently for fatigue and hgb of 7.2.  She was kept in overnight and now feels much stronger, no dizziness.  Also sleeping better but backpain persists as does the cough.  Will have colonoscopy this coming Wednesday.  The hydromet is not working well.  O:  We discussed her problems with her husband present.  Respirations stable HEENT unremarkable No bruising.at present Results for orders placed in visit on 12/08/11  POCT CBC      Component Value Range   WBC 10.7 (*) 4.6 - 10.2 (K/uL)   Lymph, poc 3.2  0.6 - 3.4    POC LYMPH PERCENT 29.7  10 - 50 (%L)   MID (cbc) 0.8  0 - 0.9    POC MID % 7.2  0 - 12 (%M)   POC Granulocyte 6.8  2 - 6.9    Granulocyte percent 63.1  37 - 80 (%G)   RBC 3.45 (*) 4.04 - 5.48 (M/uL)   Hemoglobin 8.1 (*) 12.2 - 16.2 (g/dL)   HCT, POC 16.1 (*) 09.6 - 47.9 (%)   MCV 79.3 (*) 80 - 97 (fL)   MCH, POC 23.5 (*) 27 - 31.2 (pg)   MCHC 29.6 (*) 31.8 - 35.4 (g/dL)   RDW, POC 04.5     Platelet Count, POC 381  142 - 424 (K/uL)   MPV 8.9  0 - 99.8 (fL)   A:  Suspect slow GI bleed.  Will need to stay off coumadin and, depending on colonoscopy, start different anticoagulant on Wednesday next.

## 2011-12-11 ENCOUNTER — Ambulatory Visit (INDEPENDENT_AMBULATORY_CARE_PROVIDER_SITE_OTHER): Payer: Medicare Other | Admitting: Family Medicine

## 2011-12-11 VITALS — BP 120/66 | HR 90 | Temp 97.8°F | Resp 16 | Ht 63.0 in | Wt 173.0 lb

## 2011-12-11 DIAGNOSIS — D509 Iron deficiency anemia, unspecified: Secondary | ICD-10-CM | POA: Diagnosis not present

## 2011-12-11 DIAGNOSIS — I4891 Unspecified atrial fibrillation: Secondary | ICD-10-CM | POA: Diagnosis not present

## 2011-12-11 DIAGNOSIS — Z8619 Personal history of other infectious and parasitic diseases: Secondary | ICD-10-CM | POA: Diagnosis not present

## 2011-12-11 DIAGNOSIS — D649 Anemia, unspecified: Secondary | ICD-10-CM

## 2011-12-11 DIAGNOSIS — K5730 Diverticulosis of large intestine without perforation or abscess without bleeding: Secondary | ICD-10-CM | POA: Diagnosis not present

## 2011-12-11 DIAGNOSIS — D126 Benign neoplasm of colon, unspecified: Secondary | ICD-10-CM | POA: Diagnosis not present

## 2011-12-11 DIAGNOSIS — K222 Esophageal obstruction: Secondary | ICD-10-CM | POA: Diagnosis not present

## 2011-12-11 DIAGNOSIS — Z7901 Long term (current) use of anticoagulants: Secondary | ICD-10-CM | POA: Diagnosis not present

## 2011-12-11 DIAGNOSIS — Z90710 Acquired absence of both cervix and uterus: Secondary | ICD-10-CM | POA: Diagnosis not present

## 2011-12-11 DIAGNOSIS — Z96659 Presence of unspecified artificial knee joint: Secondary | ICD-10-CM | POA: Diagnosis not present

## 2011-12-11 DIAGNOSIS — Z79899 Other long term (current) drug therapy: Secondary | ICD-10-CM | POA: Diagnosis not present

## 2011-12-11 DIAGNOSIS — K449 Diaphragmatic hernia without obstruction or gangrene: Secondary | ICD-10-CM | POA: Diagnosis not present

## 2011-12-11 DIAGNOSIS — Z86010 Personal history of colonic polyps: Secondary | ICD-10-CM | POA: Diagnosis not present

## 2011-12-11 DIAGNOSIS — K648 Other hemorrhoids: Secondary | ICD-10-CM | POA: Diagnosis not present

## 2011-12-11 LAB — POCT CBC
Granulocyte percent: 68.7 %G (ref 37–80)
HCT, POC: 26.1 % — AB (ref 37.7–47.9)
Hemoglobin: 7.7 g/dL — AB (ref 12.2–16.2)
Lymph, poc: 2.1 (ref 0.6–3.4)
MCH, POC: 23.1 pg — AB (ref 27–31.2)
MCHC: 29.5 g/dL — AB (ref 31.8–35.4)
MCV: 78.4 fL — AB (ref 80–97)
MID (cbc): 0.4 (ref 0–0.9)
MPV: 8.4 fL (ref 0–99.8)
POC Granulocyte: 5.6 (ref 2–6.9)
POC LYMPH PERCENT: 25.9 %L (ref 10–50)
POC MID %: 5.4 %M (ref 0–12)
Platelet Count, POC: 428 10*3/uL — AB (ref 142–424)
RBC: 3.33 M/uL — AB (ref 4.04–5.48)
RDW, POC: 21.9 %
WBC: 8.1 10*3/uL (ref 4.6–10.2)

## 2011-12-11 NOTE — Progress Notes (Signed)
78 yo woman who underwent colonoscopy this morning by Dr. Noelle Penner, who also ran a CBC and found the hgb was 6.7 and her hct was 22.3.  He removed two polyps and found no active bleeding.  He then sent her home with instructions to followup with [place].  No abdominal pain or dizziness. Patient is in NAD. O: NAD Results for orders placed in visit on 12/08/11  POCT CBC      Component Value Range   WBC 10.7 (*) 4.6 - 10.2 (K/uL)   Lymph, poc 3.2  0.6 - 3.4    POC LYMPH PERCENT 29.7  10 - 50 (%L)   MID (cbc) 0.8  0 - 0.9    POC MID % 7.2  0 - 12 (%M)   POC Granulocyte 6.8  2 - 6.9    Granulocyte percent 63.1  37 - 80 (%G)   RBC 3.45 (*) 4.04 - 5.48 (M/uL)   Hemoglobin 8.1 (*) 12.2 - 16.2 (g/dL)   HCT, POC 45.4 (*) 09.8 - 47.9 (%)   MCV 79.3 (*) 80 - 97 (fL)   MCH, POC 23.5 (*) 27 - 31.2 (pg)   MCHC 29.6 (*) 31.8 - 35.4 (g/dL)   RDW, POC 11.9     Platelet Count, POC 381  142 - 424 (K/uL)   MPV 8.9  0 - 99.8 (fL)   Abd:  Soft, nontender Heart:  Reg, II/VI systolic murmur Chest:  Clear Neuro:  Stable gait, alert Seen with husband Results for orders placed in visit on 12/11/11  POCT CBC      Component Value Range   WBC 8.1  4.6 - 10.2 (K/uL)   Lymph, poc 2.1  0.6 - 3.4    POC LYMPH PERCENT 25.9  10 - 50 (%L)   MID (cbc) 0.4  0 - 0.9    POC MID % 5.4  0 - 12 (%M)   POC Granulocyte 5.6  2 - 6.9    Granulocyte percent 68.7  37 - 80 (%G)   RBC 3.33 (*) 4.04 - 5.48 (M/uL)   Hemoglobin 7.7 (*) 12.2 - 16.2 (g/dL)   HCT, POC 14.7 (*) 82.9 - 47.9 (%)   MCV 78.4 (*) 80 - 97 (fL)   MCH, POC 23.1 (*) 27 - 31.2 (pg)   MCHC 29.5 (*) 31.8 - 35.4 (g/dL)   RDW, POC 56.2     Platelet Count, POC 428 (*) 142 - 424 (K/uL)   MPV 8.4  0 - 99.8 (fL)    A:  Dropping hemoglobin, need for transfusion.  I wish the gastroenterologist would offer some guidance as to long term management instead of just referring patient back to [place].  P:  Transfusion first thing tomorrow morning. Will ask for urgent hematology  evaluation. Retic count pending

## 2011-12-11 NOTE — Patient Instructions (Signed)

## 2011-12-12 ENCOUNTER — Other Ambulatory Visit: Payer: Self-pay | Admitting: Family Medicine

## 2011-12-12 ENCOUNTER — Emergency Department (HOSPITAL_COMMUNITY)
Admission: EM | Admit: 2011-12-12 | Discharge: 2011-12-12 | Discharge status: Against Medical Advice | Payer: Medicare Other | Attending: Emergency Medicine | Admitting: Emergency Medicine

## 2011-12-12 ENCOUNTER — Telehealth: Payer: Self-pay

## 2011-12-12 DIAGNOSIS — D649 Anemia, unspecified: Secondary | ICD-10-CM

## 2011-12-12 DIAGNOSIS — Z0389 Encounter for observation for other suspected diseases and conditions ruled out: Secondary | ICD-10-CM | POA: Insufficient documentation

## 2011-12-12 LAB — RETICULOCYTES
ABS Retic: 116.6 10*3/uL (ref 19.0–186.0)
RBC.: 3.24 MIL/uL — ABNORMAL LOW (ref 3.87–5.11)
Retic Ct Pct: 3.6 % — ABNORMAL HIGH (ref 0.4–2.3)

## 2011-12-12 NOTE — Telephone Encounter (Signed)
We received multiple calls from [facility] ED this morning about this pt stating Dr. Elbert Ewings told pt to go for blood transfusion this morning. Representatives from [facility] ED spoke with Diane Mejia/Dr.L multiple times about pt orders.  Sometime later, pts daughter Diane Mejia called very concerned about pt. She stated that her mother was being treated very badly by the ED staff, telling her she shouldn't be there, but at short stay instead. pts daughter stated her elderly mother and father (who is also in poor health) have been at the ED for almost 2 hours now. She asked if Dr.L could help at all. She is very concerned about her parents. I gave Diane Mejia this message to take to Dr.L (at 104 today) and she said she would see what he could do to help. This message is for documentation in addition to written message sent to Dr.L @ 104. Bf pts daughter Diane Mejia: [phone]

## 2011-12-12 NOTE — Telephone Encounter (Signed)
Called short stay to find out what was needed and was told they needed their form filled out. Asked for fax of form and Diane Mejia filled out and I faxed it back along with the original Rx from Dr L, and received confirmation

## 2011-12-12 NOTE — Telephone Encounter (Signed)
[facility] short stay needs the form filled out that they sent to dr L for patients blood transfusion tomorrow she cannot do the transfusion until that form is filled out

## 2011-12-13 ENCOUNTER — Ambulatory Visit (HOSPITAL_COMMUNITY)
Admission: RE | Admit: 2011-12-13 | Discharge: 2011-12-13 | Disposition: A | Discharge status: Home / Self Care | Payer: Medicare Other | Source: Ambulatory Visit | Attending: Family Medicine | Admitting: Family Medicine

## 2011-12-13 DIAGNOSIS — D649 Anemia, unspecified: Secondary | ICD-10-CM | POA: Diagnosis not present

## 2011-12-13 LAB — HEMOGLOBIN AND HEMATOCRIT, BLOOD
HCT: 28.6 % — ABNORMAL LOW (ref 36.0–46.0)
Hemoglobin: 8.8 g/dL — ABNORMAL LOW (ref 12.0–15.0)

## 2011-12-13 LAB — PREPARE RBC (CROSSMATCH)

## 2011-12-13 LAB — ABO/RH: ABO/RH(D): O POS

## 2011-12-14 LAB — TYPE AND SCREEN
ABO/RH(D): O POS
Antibody Screen: NEGATIVE
Unit division: 0
Unit division: 0

## 2011-12-18 DIAGNOSIS — D509 Iron deficiency anemia, unspecified: Secondary | ICD-10-CM | POA: Diagnosis not present

## 2011-12-21 ENCOUNTER — Ambulatory Visit: Payer: Medicare Other | Admitting: Family Medicine

## 2011-12-21 ENCOUNTER — Telehealth: Payer: Self-pay

## 2011-12-24 ENCOUNTER — Other Ambulatory Visit: Payer: Self-pay | Admitting: Family Medicine

## 2011-12-24 DIAGNOSIS — D649 Anemia, unspecified: Secondary | ICD-10-CM

## 2011-12-25 ENCOUNTER — Encounter: Payer: Self-pay | Admitting: Family Medicine

## 2011-12-25 ENCOUNTER — Ambulatory Visit (INDEPENDENT_AMBULATORY_CARE_PROVIDER_SITE_OTHER): Payer: Medicare Other | Admitting: Family Medicine

## 2011-12-25 DIAGNOSIS — D649 Anemia, unspecified: Secondary | ICD-10-CM

## 2011-12-25 DIAGNOSIS — R7309 Other abnormal glucose: Secondary | ICD-10-CM

## 2011-12-25 DIAGNOSIS — R739 Hyperglycemia, unspecified: Secondary | ICD-10-CM

## 2011-12-25 LAB — POCT CBC
Granulocyte percent: 65.9 %G (ref 37–80)
HCT, POC: 33.3 % — AB (ref 37.7–47.9)
Hemoglobin: 9.9 g/dL — AB (ref 12.2–16.2)
Lymph, poc: 3.2 (ref 0.6–3.4)
MCH, POC: 24.3 pg — AB (ref 27–31.2)
MCHC: 29.7 g/dL — AB (ref 31.8–35.4)
MCV: 81.6 fL (ref 80–97)
MID (cbc): 0.7 (ref 0–0.9)
MPV: 10.2 fL (ref 0–99.8)
POC Granulocyte: 7.6 — AB (ref 2–6.9)
POC LYMPH PERCENT: 27.7 %L (ref 10–50)
POC MID %: 6.4 %M (ref 0–12)
Platelet Count, POC: 489 10*3/uL — AB (ref 142–424)
RBC: 4.08 M/uL (ref 4.04–5.48)
RDW, POC: 22.1 %
WBC: 11.6 10*3/uL — AB (ref 4.6–10.2)

## 2011-12-25 LAB — POCT GLYCOSYLATED HEMOGLOBIN (HGB A1C): Hemoglobin A1C: 5.1

## 2011-12-25 LAB — POCT SEDIMENTATION RATE: POCT SED RATE: 58 mm/hr — AB (ref 0–22)

## 2011-12-25 NOTE — Progress Notes (Signed)
Here for anemia follow up labs

## 2011-12-26 LAB — RETICULOCYTES
ABS Retic: 130.6 10*3/uL (ref 19.0–186.0)
RBC.: 4.08 MIL/uL (ref 3.87–5.11)
Retic Ct Pct: 3.2 % — ABNORMAL HIGH (ref 0.4–2.3)

## 2011-12-26 LAB — BASIC METABOLIC PANEL
BUN: 23 mg/dL (ref 6–23)
CO2: 23 mEq/L (ref 19–32)
Calcium: 9.7 mg/dL (ref 8.4–10.5)
Chloride: 105 mEq/L (ref 96–112)
Creat: 1.18 mg/dL — ABNORMAL HIGH (ref 0.50–1.10)
Glucose, Bld: 99 mg/dL (ref 70–99)
Potassium: 5.3 mEq/L (ref 3.5–5.3)
Sodium: 140 mEq/L (ref 135–145)

## 2011-12-26 LAB — HAPTOGLOBIN: Haptoglobin: 191 mg/dL (ref 45–215)

## 2011-12-27 ENCOUNTER — Other Ambulatory Visit: Payer: Self-pay

## 2011-12-27 ENCOUNTER — Telehealth: Payer: Self-pay | Admitting: Hematology & Oncology

## 2011-12-27 NOTE — Telephone Encounter (Signed)
Pt aware of 01-17-12 appointment. Lupita Leash from referring is aware ([phone] ext [phone])

## 2011-12-30 NOTE — Progress Notes (Signed)
In for lab work only

## 2012-01-01 NOTE — Progress Notes (Signed)
Patient left without being seen.

## 2012-01-16 DIAGNOSIS — Z1231 Encounter for screening mammogram for malignant neoplasm of breast: Secondary | ICD-10-CM | POA: Diagnosis not present

## 2012-01-17 ENCOUNTER — Ambulatory Visit (HOSPITAL_BASED_OUTPATIENT_CLINIC_OR_DEPARTMENT_OTHER): Payer: Medicare Other | Admitting: Hematology & Oncology

## 2012-01-17 ENCOUNTER — Other Ambulatory Visit (HOSPITAL_BASED_OUTPATIENT_CLINIC_OR_DEPARTMENT_OTHER): Payer: Medicare Other | Admitting: Lab

## 2012-01-17 ENCOUNTER — Ambulatory Visit: Payer: Medicare Other

## 2012-01-17 VITALS — BP 146/73 | HR 86 | Temp 97.1°F | Ht 62.0 in | Wt 175.0 lb

## 2012-01-17 DIAGNOSIS — D649 Anemia, unspecified: Secondary | ICD-10-CM | POA: Diagnosis not present

## 2012-01-17 DIAGNOSIS — D509 Iron deficiency anemia, unspecified: Secondary | ICD-10-CM | POA: Diagnosis not present

## 2012-01-17 LAB — CBC WITH DIFFERENTIAL (CANCER CENTER ONLY)
BASO#: 0.1 10*3/uL (ref 0.0–0.2)
Eosinophils Absolute: 0.8 10*3/uL — ABNORMAL HIGH (ref 0.0–0.5)
HCT: 36.2 % (ref 34.8–46.6)
HGB: 11.1 g/dL — ABNORMAL LOW (ref 11.6–15.9)
LYMPH#: 2.9 10*3/uL (ref 0.9–3.3)
MCH: 27.8 pg (ref 26.0–34.0)
MONO%: 7.7 % (ref 0.0–13.0)
NEUT#: 4.2 10*3/uL (ref 1.5–6.5)
NEUT%: 48.9 % (ref 39.6–80.0)
RBC: 3.99 10*6/uL (ref 3.70–5.32)

## 2012-01-17 LAB — CHCC SATELLITE - SMEAR

## 2012-01-17 NOTE — Progress Notes (Signed)
CC:   Diane Mejia, M.D.  DIAGNOSIS:  Recurrent anemia, likely gastrointestinal bleeding/iron deficiency.  HISTORY OF PRESENT ILLNESS:  Diane Mejia is a very charming 78 year old white female.  She is followed by Dr. Elvina Mejia.  She has been having problems with anemia.  She has been on chronic Coumadin for paroxysmal atrial fibrillation.  She has had blood transfusions for this.  She actually had a, I think, colonoscopy recently.  The colonoscopy apparently did not show any obvious lower GI bleeding. There were a couple of polyps.  She has, again, required blood transfusions.  She has noticed some, I think, melena.  She has been taking some oral iron.  Back in January 2011, her CBC showed a white cell count  of 7.8, hemoglobin 13, hematocrit 39 and platelet count was 360.  In February of 2011, her hemoglobin was 8.5 and hematocrit 25.  She has a CBC done in February of 2013.  This showed a white cell count of 7.5, hemoglobin 7.2, hematocrit 25.3, platelet count 28.5.  MCV was down to 74.  She did a reticulocyte count at that time, which corrected was about 2.0.  She had iron studies done.  On February 16th, her ferritin was only 4.  Her iron saturation was only 8.  Again, she is on some iron pills.  She does have renal insufficiency.  Her most recent BUN and creatinine were 23 and 1.18.  This was done a month ago.  Her creatinine clearance was 45 mL per minute.  She has had a normal protein and albumin.  Her calcium has been normal.  Again, she has been on chronic Coumadin.  She now is off Coumadin.  Dr. Milus Glazier is thinking about putting her on Plavix.  She sees her cardiologist, who is out at [facility], to see what he has to say.  She has had some leg pain.  She says this is because of a past accident and surgery for her legs.  She has a decent appetite.  She has had no dysphagia or odynophagia. There has been no cough.  There is some fatigue.  Her last x-ray  that I see was from January 2011.  This was a chest x-ray which was relatively unremarkable outside of a hiatal hernia.  She was kindly referred to the [facility] for an evaluation.  PAST MEDICAL HISTORY: 1. Remarkable for paroxysmal atrial fibrillation. 2. Osteoarthritis. 3. Transient GERD.  ALLERGIES:  To sulfa antibiotics.  MEDICATIONS ARE:  Cardizem-LA 180 mg p.o. daily, iron sulfate 1 p.o. daily, Hycodan cough medicine 5 mL q.h.s. p.r.n., potassium chloride 8 mEq 3 times a week, Coumadin 6 mg p.o. daily (on hold).  SOCIAL HISTORY:  Negative for tobacco use.  There is no alcohol use. There are no obvious occupational exposures.  She is a retired Engineer, civil (consulting).  FAMILY HISTORY:  Relatively unremarkable.  REVIEW OF SYSTEMS:  As stated in the history of present illness.  PHYSICAL EXAMINATION:  This is an elderly but fairly well-nourished white female in no obvious distress.  Vital signs:  Temperature 97.1, pulse 86, respiratory rate 20, blood pressure 146/73.  Weight is 175. Head and neck:  Shows a normocephalic, atraumatic skull.  There are no ocular or oral lesions.  There are no palpable cervical or supraclavicular lymph nodes.  Her conjunctivae are pink.  Lungs:  Clear bilaterally.  She has no rales, wheezes or rhonchi.  Cardiac:  Regular rate and rhythm with a normal S1 and S2.  She  has a 1/6 systolic ejection murmur.  Abdomen:  Soft with good bowel sounds.  There is no palpable abdominal mass.  There is no palpable hepatosplenomegaly. Back:  Shows some slight kyphosis.  She has no tenderness over the spine, ribs, or hips.  Extremities:  Show some nonpitting edema in her legs.  Skin:  Shows no rashes, ecchymosis or petechia.  LABORATORY STUDIES:  White cell count 8.7, hemoglobin 11.1, hematocrit 36.2, and platelet count 335.  MCV is 91.  Peripheral smear shows moderate anisocytosis.  She has numerous microcytic red cells.  I see no schistocytes.  There are  no spherocytes. There is no rouleaux formation.  I see no nucleated red cells or target cells.  White cells appear normal in morphology and maturation.  There are no immature myeloid or lymphoid forms.  There are no hypersegmented polys.  I see no atypical lymphocytes.  Platelets are adequate in number and size.  IMPRESSION:  Diane Mejia is a nice 78 year old white female with anemia.  I must say that her anemia is not nearly as bad as I thought it would be. One has to believe that her being off Coumadin right now is helping her.  By her iron studies a couple of months ago, she definitely was iron- deficient.  I am not how well she is really absorbing iron.  Maybe she is having some luck with iron absorption.  I still feel that she is having some gastrointestinal blood loss.  Her blood smear is most consistent with iron deficiency.  I suspect that we are going to have to give her IV iron.  I believe this is going to be the best way for [place] to help with her anemia.  I do not see that we need to do a bone marrow test on her right now as I do not suspect any type of myelodysplastic process.  I think the real issue is going to anticoagulation for her. Unfortunately, she is going to need to be on some kind of anticoagulation.  I do not think there is a right or wrong answer for this.  Since she has paroxysmal atrial fibrillation, that definitely puts her at risk for a cerebrovascular accident.  We will go ahead and call her with the results of her lab work.  Once we figure out if she needs IV iron, then we can probably set up a followup for her about a month or so afterwards.  I spent a good hour or so with Diane Mejia and her family.  They are all very nice.  We had good fellowship.    ______________________________ Josph Macho, M.D. PRE/MEDQ  D:  01/17/2012  T:  01/17/2012  Job:  [id number]  ADDENDUM:  Ferritin is 48.  %Sat is 11. Will need IV iron.

## 2012-01-17 NOTE — Progress Notes (Signed)
This office note has been dictated.

## 2012-01-21 ENCOUNTER — Other Ambulatory Visit: Payer: Self-pay | Admitting: Family Medicine

## 2012-01-21 DIAGNOSIS — D649 Anemia, unspecified: Secondary | ICD-10-CM

## 2012-01-21 LAB — HAPTOGLOBIN: Haptoglobin: 178 mg/dL (ref 45–215)

## 2012-01-21 LAB — IRON AND TIBC
%SAT: 11 % — ABNORMAL LOW (ref 20–55)
Iron: 39 ug/dL — ABNORMAL LOW (ref 42–145)
TIBC: 345 ug/dL (ref 250–470)

## 2012-01-23 ENCOUNTER — Telehealth: Payer: Self-pay | Admitting: Hematology & Oncology

## 2012-01-23 ENCOUNTER — Other Ambulatory Visit: Payer: Self-pay | Admitting: *Deleted

## 2012-01-23 DIAGNOSIS — D509 Iron deficiency anemia, unspecified: Secondary | ICD-10-CM

## 2012-01-23 MED ORDER — FERUMOXYTOL INJECTION 510 MG/17 ML: 510.0000 mg | Freq: Once | INTRAVENOUS | Status: DC

## 2012-01-23 NOTE — Telephone Encounter (Signed)
Pt aware of 4-19 iron infusion

## 2012-01-28 DIAGNOSIS — Z79899 Other long term (current) drug therapy: Secondary | ICD-10-CM | POA: Diagnosis not present

## 2012-01-28 DIAGNOSIS — I4891 Unspecified atrial fibrillation: Secondary | ICD-10-CM | POA: Diagnosis not present

## 2012-01-28 DIAGNOSIS — R002 Palpitations: Secondary | ICD-10-CM | POA: Diagnosis not present

## 2012-01-28 DIAGNOSIS — R000 Tachycardia, unspecified: Secondary | ICD-10-CM | POA: Diagnosis not present

## 2012-01-28 DIAGNOSIS — Z882 Allergy status to sulfonamides status: Secondary | ICD-10-CM | POA: Diagnosis not present

## 2012-01-28 DIAGNOSIS — I4949 Other premature depolarization: Secondary | ICD-10-CM | POA: Diagnosis not present

## 2012-01-28 DIAGNOSIS — D649 Anemia, unspecified: Secondary | ICD-10-CM | POA: Diagnosis not present

## 2012-01-28 DIAGNOSIS — F430 Acute stress reaction: Secondary | ICD-10-CM | POA: Diagnosis not present

## 2012-01-31 ENCOUNTER — Ambulatory Visit (HOSPITAL_BASED_OUTPATIENT_CLINIC_OR_DEPARTMENT_OTHER): Payer: Medicare Other

## 2012-01-31 ENCOUNTER — Other Ambulatory Visit: Payer: Self-pay | Admitting: Hematology & Oncology

## 2012-01-31 VITALS — BP 132/74 | HR 65 | Temp 97.3°F

## 2012-01-31 DIAGNOSIS — D509 Iron deficiency anemia, unspecified: Secondary | ICD-10-CM

## 2012-01-31 MED ORDER — SODIUM CHLORIDE 0.9 % IV SOLN
1020.0000 mg | Freq: Once | INTRAVENOUS | Status: AC
  Administered 2012-01-31: 1020 mg via INTRAVENOUS
  Filled 2012-01-31: qty 34

## 2012-01-31 MED ORDER — SODIUM CHLORIDE 0.9 % IV SOLN
Freq: Once | INTRAVENOUS | Status: AC
  Administered 2012-01-31: 15:00:00 via INTRAVENOUS

## 2012-01-31 NOTE — Patient Instructions (Signed)
Ferumoxytol injection What is this medicine? FERUMOXYTOL is an iron complex. Iron is used to make healthy red blood cells, which carry oxygen and nutrients throughout the body. This medicine is used to treat iron deficiency anemia in people with chronic kidney disease. This medicine may be used for other purposes; ask your health care provider or pharmacist if you have questions. What should I tell my health care provider before I take this medicine? They need to know if you have any of these conditions: -anemia not caused by low iron levels -high levels of iron in the blood -magnetic resonance imaging (MRI) test scheduled -an unusual or allergic reaction to iron, other medicines, foods, dyes, or preservatives -pregnant or trying to get pregnant -breast-feeding How should I use this medicine? This medicine is for infusion into a vein. It is given by a health care professional in a hospital or clinic setting. Talk to your pediatrician regarding the use of this medicine in children. Special care may be needed. Overdosage: If you think you've taken too much of this medicine contact a poison control center or emergency room at once. Overdosage: If you think you have taken too much of this medicine contact a poison control center or emergency room at once. NOTE: This medicine is only for you. Do not share this medicine with others. What if I miss a dose? It is important not to miss your dose. Call your doctor or health care professional if you are unable to keep an appointment. What may interact with this medicine? This medicine may interact with the following medications: -other iron products This list may not describe all possible interactions. Give your health care provider a list of all the medicines, herbs, non-prescription drugs, or dietary supplements you use. Also tell them if you smoke, drink alcohol, or use illegal drugs. Some items may interact with your medicine. What should I watch  for while using this medicine? Visit your doctor or healthcare professional regularly. Tell your doctor or healthcare professional if your symptoms do not start to get better or if they get worse. You may need blood work done while you are taking this medicine. You may need to follow a special diet. Talk to your doctor. Foods that contain iron include: whole grains/cereals, dried fruits, beans, or peas, leafy green vegetables, and organ meats (liver, kidney). What side effects may I notice from receiving this medicine? Side effects that you should report to your doctor or health care professional as soon as possible: -allergic reactions like skin rash, itching or hives, swelling of the face, lips, or tongue -breathing problems -changes in blood pressure -feeling faint or lightheaded, falls -fever or chills -flushing, sweating, or hot feelings -swelling of the ankles or feet Side effects that usually do not require medical attention (Report these to your doctor or health care professional if they continue or are bothersome.): -diarrhea -headache -nausea, vomiting -stomach pain This list may not describe all possible side effects. Call your doctor for medical advice about side effects. You may report side effects to FDA at 1-800-FDA-1088. Where should I keep my medicine? This drug is given in a hospital or clinic and will not be stored at home. NOTE: This sheet is a summary. It may not cover all possible information. If you have questions about this medicine, talk to your doctor, pharmacist, or health care provider.  2012, Elsevier/Gold Standard. (06/22/2008 9:48:25 PM) 

## 2012-01-31 NOTE — Progress Notes (Signed)
Notes Recorded by Josph Macho, MD on 01/22/2012 at 9:39 PM Call - iron is low. Need Feraheme 1020mg  in 1-2 weeks.

## 2012-03-06 ENCOUNTER — Ambulatory Visit (HOSPITAL_BASED_OUTPATIENT_CLINIC_OR_DEPARTMENT_OTHER): Payer: Medicare Other | Admitting: Hematology & Oncology

## 2012-03-06 ENCOUNTER — Other Ambulatory Visit (HOSPITAL_BASED_OUTPATIENT_CLINIC_OR_DEPARTMENT_OTHER): Payer: Medicare Other | Admitting: Lab

## 2012-03-06 VITALS — BP 116/65 | HR 72 | Temp 96.5°F | Ht 62.0 in | Wt 176.0 lb

## 2012-03-06 DIAGNOSIS — K922 Gastrointestinal hemorrhage, unspecified: Secondary | ICD-10-CM

## 2012-03-06 DIAGNOSIS — M1990 Unspecified osteoarthritis, unspecified site: Secondary | ICD-10-CM | POA: Diagnosis not present

## 2012-03-06 DIAGNOSIS — D509 Iron deficiency anemia, unspecified: Secondary | ICD-10-CM

## 2012-03-06 LAB — IRON AND TIBC: %SAT: 24 % (ref 20–55)

## 2012-03-06 LAB — CBC WITH DIFFERENTIAL (CANCER CENTER ONLY)
BASO#: 0 10*3/uL (ref 0.0–0.2)
Eosinophils Absolute: 1 10*3/uL — ABNORMAL HIGH (ref 0.0–0.5)
HGB: 12.9 g/dL (ref 11.6–15.9)
LYMPH%: 29.7 % (ref 14.0–48.0)
MCH: 29.7 pg (ref 26.0–34.0)
MCV: 94 fL (ref 81–101)
MONO%: 6.9 % (ref 0.0–13.0)
NEUT%: 50.9 % (ref 39.6–80.0)
RBC: 4.34 10*6/uL (ref 3.70–5.32)

## 2012-03-06 LAB — RETICULOCYTES (CHCC)
ABS Retic: 75 10*3/uL (ref 19.0–186.0)
RBC.: 4.41 MIL/uL (ref 3.87–5.11)
Retic Ct Pct: 1.7 % (ref 0.4–2.3)

## 2012-03-06 NOTE — Progress Notes (Signed)
CC:   Elvina Sidle, M.D.  DIAGNOSIS:  Recurrent iron-deficiency anemia.  CURRENT THERAPY:  IV iron as indicated.  INTERVAL HISTORY:  Ms. Caporaso comes in for followup.  We saw her back in April.  At that point in time, she clearly was iron-deficient.  She had GI bleeding from being on Coumadin.  I felt that the Coumadin probably could be stopped.  When we saw her, her ferritin was 48.  Her iron saturation was only 11.  A lot of the ferritin elevation is because she has bad osteoarthritis.  We did go ahead and give her IV iron.  She got 1020 mg back on 04/192013.  She responded well to this.  She is now off Coumadin.  She is on aspirin.  She feels tired.  I think this probably more from other health issues than her being anemic.  She is having no problems with nausea or vomiting.  She has a lot of joint issues.  She has a lot of osteoarthritis.  She has swelling in her joints.  PHYSICAL EXAMINATION:  This is an elderly white female in no obvious distress.  Vital signs:  Temperature of 96.5, pulse 72, respiratory rate 18, blood pressure 116/65.  Weight is 176.  Head and neck:  A normocephalic, atraumatic skull.  There are no ocular or oral lesions. There are no palpable cervical or supraclavicular lymph nodes.  Lungs: Clear bilaterally.  Cardiac:  Regular rate and rhythm with a normal S1 and S2.  There are no murmurs, rubs or bruits.  Abdomen:  Soft with good bowel sounds.  There is no palpable abdominal mass.  There is no palpable hepatosplenomegaly.  Extremities:  Osteoarthritic changes in her joints.  She has some muscle atrophy in her hands secondary to the osteoarthritis.  She has swelling down in her lower legs.  She has nonpitting edema in the lower legs.  She has some decreased range of motion of her joints.  LABORATORY STUDIES:  White cell count is 8.6, hemoglobin 12.9, hematocrit 40.8, platelet count 295.  MCV is 94.  IMPRESSION:  Ms. Purdum is a very charming  78 year old white female with an iron-deficiency anemia.  This is because of gastrointestinal bleeding.  She was on Coumadin.  She is off Coumadin now.  As much as I like Ms. Manson Passey, I really do not think we have to get her back to the office.  We gave her the iron.  She responded very nicely. I just do not see that we are going to have any impact on her medical care.  I told Ms. Rydberg that she could always come back to see [place] in the future if she does have any issues or if she becomes anemic again.    ______________________________ Josph Macho, M.D. PRE/MEDQ  D:  03/06/2012  T:  03/06/2012  Job:  [id number]

## 2012-03-06 NOTE — Progress Notes (Signed)
This office note has been dictated.

## 2012-04-24 ENCOUNTER — Ambulatory Visit (INDEPENDENT_AMBULATORY_CARE_PROVIDER_SITE_OTHER): Payer: Medicare Other | Admitting: Family Medicine

## 2012-04-24 ENCOUNTER — Ambulatory Visit: Payer: Medicare Other

## 2012-04-24 VITALS — BP 116/56 | HR 69 | Temp 97.7°F | Resp 16 | Ht 63.5 in | Wt 176.8 lb

## 2012-04-24 DIAGNOSIS — R5381 Other malaise: Secondary | ICD-10-CM | POA: Diagnosis not present

## 2012-04-24 DIAGNOSIS — R05 Cough: Secondary | ICD-10-CM | POA: Diagnosis not present

## 2012-04-24 DIAGNOSIS — F329 Major depressive disorder, single episode, unspecified: Secondary | ICD-10-CM

## 2012-04-24 DIAGNOSIS — R531 Weakness: Secondary | ICD-10-CM

## 2012-04-24 DIAGNOSIS — I509 Heart failure, unspecified: Secondary | ICD-10-CM

## 2012-04-24 DIAGNOSIS — R5383 Other fatigue: Secondary | ICD-10-CM | POA: Diagnosis not present

## 2012-04-24 DIAGNOSIS — F32A Depression, unspecified: Secondary | ICD-10-CM

## 2012-04-24 DIAGNOSIS — R059 Cough, unspecified: Secondary | ICD-10-CM

## 2012-04-24 LAB — COMPREHENSIVE METABOLIC PANEL
ALT: 9 U/L (ref 0–35)
AST: 14 U/L (ref 0–37)
Albumin: 3.9 g/dL (ref 3.5–5.2)
Alkaline Phosphatase: 76 U/L (ref 39–117)
BUN: 15 mg/dL (ref 6–23)
CO2: 26 mEq/L (ref 19–32)
Calcium: 9.8 mg/dL (ref 8.4–10.5)
Chloride: 103 mEq/L (ref 96–112)
Creat: 1.24 mg/dL — ABNORMAL HIGH (ref 0.50–1.10)
Glucose, Bld: 98 mg/dL (ref 70–99)
Potassium: 5 mEq/L (ref 3.5–5.3)
Sodium: 144 mEq/L (ref 135–145)
Total Bilirubin: 0.4 mg/dL (ref 0.3–1.2)
Total Protein: 6.7 g/dL (ref 6.0–8.3)

## 2012-04-24 LAB — POCT CBC
Granulocyte percent: 63 %G (ref 37–80)
HCT, POC: 43.3 % (ref 37.7–47.9)
Hemoglobin: 13.1 g/dL (ref 12.2–16.2)
Lymph, poc: 2.9 (ref 0.6–3.4)
MCH, POC: 29.5 pg (ref 27–31.2)
MCHC: 30.3 g/dL — AB (ref 31.8–35.4)
MCV: 97.5 fL — AB (ref 80–97)
MID (cbc): 0.8 (ref 0–0.9)
MPV: 9.3 fL (ref 0–99.8)
POC Granulocyte: 6.2 (ref 2–6.9)
POC LYMPH PERCENT: 28.8 %L (ref 10–50)
POC MID %: 8.2 %M (ref 0–12)
Platelet Count, POC: 408 10*3/uL (ref 142–424)
RBC: 4.44 M/uL (ref 4.04–5.48)
RDW, POC: 15.1 %
WBC: 9.9 10*3/uL (ref 4.6–10.2)

## 2012-04-24 LAB — TSH: TSH: 2.358 u[IU]/mL (ref 0.350–4.500)

## 2012-04-24 MED ORDER — HYDROCOD POLST-CHLORPHEN POLST 10-8 MG/5ML PO LQCR: 5.0000 mL | Freq: Two times a day (BID) | ORAL | Status: DC | PRN

## 2012-04-24 MED ORDER — FUROSEMIDE 20 MG PO TABS: 20.0000 mg | ORAL_TABLET | Freq: Every day | ORAL | Status: DC

## 2012-04-24 MED ORDER — PAROXETINE HCL 20 MG PO TABS: 20.0000 mg | ORAL_TABLET | ORAL | Status: DC

## 2012-04-24 MED ORDER — CYANOCOBALAMIN 1000 MCG/ML IJ SOLN
1000.0000 ug | Freq: Once | INTRAMUSCULAR | Status: AC
  Administered 2012-04-24: 1000 ug via INTRAMUSCULAR

## 2012-04-24 NOTE — Progress Notes (Signed)
78 yo woman comes in with cough, worse at night, without fever.  She notes the cough is productive.  She also complains of depression despite taking cymbalta 30 mg.   Her back is causing incredible pain.  No energy.  Not sleeping well.  The Percocet does not control the pain well.  Thirdly, she wants a follow up hemoglobin check for her anemia.  Objective:  NAD, elderly woman who has good eye contact  HEENT:  Unremarkable Chest:  Bibasilar dry rales, moderate scoliosis Heart:  occas irreg with S4 and I/VI systolic ejection murmur Ext:  No edema Skin:  No ecchymosis Gait: stable [facility] reading (PRIMARY) by  Dr. Milus Glazier CXR: cardiomegaly and increased interstitial markings .= Results for orders placed in visit on 04/24/12  POCT CBC      Component Value Range   WBC 9.9  4.6 - 10.2 K/uL   Lymph, poc 2.9  0.6 - 3.4   POC LYMPH PERCENT 28.8  10 - 50 %L   MID (cbc) 0.8  0 - 0.9   POC MID % 8.2  0 - 12 %M   POC Granulocyte 6.2  2 - 6.9   Granulocyte percent 63.0  37 - 80 %G   RBC 4.44  4.04 - 5.48 M/uL   Hemoglobin 13.1  12.2 - 16.2 g/dL   HCT, POC 40.9  81.1 - 47.9 %   MCV 97.5 (*) 80 - 97 fL   MCH, POC 29.5  27 - 31.2 pg   MCHC 30.3 (*) 31.8 - 35.4 g/dL   RDW, POC 91.4     Platelet Count, POC 408  142 - 424 K/uL   MPV 9.3  0 - 99.8 fL      Assessment:  Mild CHF  Plan:   Lasix 20 mg qd B12 shot Add Paxil 20 mg qhs

## 2012-04-25 LAB — PRO B NATRIURETIC PEPTIDE: Pro B Natriuretic peptide (BNP): 325.5 pg/mL (ref <451)

## 2012-05-08 ENCOUNTER — Ambulatory Visit (INDEPENDENT_AMBULATORY_CARE_PROVIDER_SITE_OTHER): Payer: Medicare Other | Admitting: Family Medicine

## 2012-05-08 VITALS — BP 116/52 | HR 80 | Temp 97.8°F | Resp 16 | Ht 62.5 in | Wt 177.0 lb

## 2012-05-08 DIAGNOSIS — E8940 Asymptomatic postprocedural ovarian failure: Secondary | ICD-10-CM | POA: Diagnosis not present

## 2012-05-08 DIAGNOSIS — F329 Major depressive disorder, single episode, unspecified: Secondary | ICD-10-CM

## 2012-05-08 DIAGNOSIS — M549 Dorsalgia, unspecified: Secondary | ICD-10-CM

## 2012-05-08 DIAGNOSIS — B351 Tinea unguium: Secondary | ICD-10-CM

## 2012-05-08 DIAGNOSIS — F32A Depression, unspecified: Secondary | ICD-10-CM

## 2012-05-08 DIAGNOSIS — E538 Deficiency of other specified B group vitamins: Secondary | ICD-10-CM

## 2012-05-08 MED ORDER — TERBINAFINE HCL 250 MG PO TABS: 250.0000 mg | ORAL_TABLET | Freq: Every day | ORAL | Status: DC

## 2012-05-08 MED ORDER — CYANOCOBALAMIN 1000 MCG/ML IJ SOLN: 1000.0000 ug | Freq: Once | INTRAMUSCULAR | Status: DC

## 2012-05-08 NOTE — Progress Notes (Signed)
78 year old woman comes in for followup of cough and back pain. Her depression is also been a problem but she's doing much better taking the Paxil at night. When she initially started the Paxil she had some problems with dizziness but has found it is taking at night soft flat.  Her back pain continues be her main problem. It's particularly bad when she stands up for a while. She's had injections into the facet joints but this has not helped. She has a history of spinal stenosis. She's not having weakness or incontinence.  In addition she has loss of nails on her left hand: Thumb index and middle fingers. She's wanted to do something about this for some time. It's getting worse.  Her cough is much better and she has no shortness of breath.  Patient noted significant improvement after B12 shot on last visit.  Objective: Patient appears much less depressed today. She's alert and very articulate.  Chest: Few basilar rales both sides. Examination of hands shows left-sided tinea unguium.  Assessment: Chronic back pain secondary to spinal stenosis and arthritis which may need another epidural injection. October be willing to refer her for that she's going to try to make the appointment on her own.  1. B12 deficiency  cyanocobalamin (,VITAMIN B-12,) 1000 MCG/ML injection  2. Back pain    3. Depression    4. Tinea unguium  terbinafine (LAMISIL) 250 MG tablet

## 2012-05-12 DIAGNOSIS — C44621 Squamous cell carcinoma of skin of unspecified upper limb, including shoulder: Secondary | ICD-10-CM | POA: Diagnosis not present

## 2012-05-18 DIAGNOSIS — C44621 Squamous cell carcinoma of skin of unspecified upper limb, including shoulder: Secondary | ICD-10-CM | POA: Diagnosis not present

## 2012-05-18 DIAGNOSIS — D485 Neoplasm of uncertain behavior of skin: Secondary | ICD-10-CM | POA: Diagnosis not present

## 2012-05-18 DIAGNOSIS — L821 Other seborrheic keratosis: Secondary | ICD-10-CM | POA: Diagnosis not present

## 2012-05-18 DIAGNOSIS — D239 Other benign neoplasm of skin, unspecified: Secondary | ICD-10-CM | POA: Diagnosis not present

## 2012-05-22 ENCOUNTER — Other Ambulatory Visit: Payer: Self-pay | Admitting: Family Medicine

## 2012-05-23 NOTE — Telephone Encounter (Signed)
Ok x 6 months 

## 2012-06-01 DIAGNOSIS — D3701 Neoplasm of uncertain behavior of lip: Secondary | ICD-10-CM | POA: Diagnosis not present

## 2012-06-01 DIAGNOSIS — D3709 Neoplasm of uncertain behavior of other specified sites of the oral cavity: Secondary | ICD-10-CM | POA: Diagnosis not present

## 2012-06-03 DIAGNOSIS — H2510 Age-related nuclear cataract, unspecified eye: Secondary | ICD-10-CM | POA: Diagnosis not present

## 2012-06-05 DIAGNOSIS — M1710 Unilateral primary osteoarthritis, unspecified knee: Secondary | ICD-10-CM | POA: Diagnosis not present

## 2012-06-05 DIAGNOSIS — M545 Low back pain: Secondary | ICD-10-CM | POA: Diagnosis not present

## 2012-06-08 ENCOUNTER — Telehealth: Payer: Self-pay

## 2012-06-08 MED ORDER — DULOXETINE HCL 30 MG PO CPEP: 30.0000 mg | ORAL_CAPSULE | Freq: Every day | ORAL | Status: DC

## 2012-06-08 NOTE — Telephone Encounter (Signed)
Rx authorized

## 2012-06-08 NOTE — Telephone Encounter (Signed)
Patient states she has lost Cymbalta Rx. Did a clean out of medications and has looked everywhere for it. [facility] [address] [phone]

## 2012-06-09 DIAGNOSIS — D3705 Neoplasm of uncertain behavior of pharynx: Secondary | ICD-10-CM | POA: Diagnosis not present

## 2012-06-09 DIAGNOSIS — D3701 Neoplasm of uncertain behavior of lip: Secondary | ICD-10-CM | POA: Diagnosis not present

## 2012-06-09 NOTE — Telephone Encounter (Signed)
I have lm for her to advise.

## 2012-06-11 DIAGNOSIS — M545 Low back pain: Secondary | ICD-10-CM | POA: Diagnosis not present

## 2012-06-11 DIAGNOSIS — M5126 Other intervertebral disc displacement, lumbar region: Secondary | ICD-10-CM | POA: Diagnosis not present

## 2012-06-12 ENCOUNTER — Ambulatory Visit (INDEPENDENT_AMBULATORY_CARE_PROVIDER_SITE_OTHER): Payer: Medicare Other | Admitting: Family Medicine

## 2012-06-12 VITALS — BP 132/68 | HR 80 | Temp 98.3°F | Resp 16 | Ht 63.0 in | Wt 176.4 lb

## 2012-06-12 DIAGNOSIS — R5383 Other fatigue: Secondary | ICD-10-CM

## 2012-06-12 DIAGNOSIS — R5381 Other malaise: Secondary | ICD-10-CM

## 2012-06-12 DIAGNOSIS — R531 Weakness: Secondary | ICD-10-CM

## 2012-06-12 DIAGNOSIS — D649 Anemia, unspecified: Secondary | ICD-10-CM

## 2012-06-12 LAB — COMPREHENSIVE METABOLIC PANEL
ALT: 10 U/L (ref 0–35)
AST: 13 U/L (ref 0–37)
Albumin: 4.1 g/dL (ref 3.5–5.2)
Alkaline Phosphatase: 102 U/L (ref 39–117)
BUN: 28 mg/dL — ABNORMAL HIGH (ref 6–23)
CO2: 28 mEq/L (ref 19–32)
Calcium: 10 mg/dL (ref 8.4–10.5)
Chloride: 102 mEq/L (ref 96–112)
Creat: 1.26 mg/dL — ABNORMAL HIGH (ref 0.50–1.10)
Glucose, Bld: 114 mg/dL — ABNORMAL HIGH (ref 70–99)
Potassium: 5.7 mEq/L — ABNORMAL HIGH (ref 3.5–5.3)
Sodium: 138 mEq/L (ref 135–145)
Total Bilirubin: 0.3 mg/dL (ref 0.3–1.2)
Total Protein: 7.2 g/dL (ref 6.0–8.3)

## 2012-06-12 LAB — POCT CBC
Granulocyte percent: 83.8 %G — AB (ref 37–80)
HCT, POC: 41.9 % (ref 37.7–47.9)
Hemoglobin: 13 g/dL (ref 12.2–16.2)
Lymph, poc: 2 (ref 0.6–3.4)
MCH, POC: 29.7 pg (ref 27–31.2)
MCHC: 31 g/dL — AB (ref 31.8–35.4)
MCV: 95.6 fL (ref 80–97)
MID (cbc): 0.4 (ref 0–0.9)
MPV: 9.9 fL (ref 0–99.8)
POC Granulocyte: 12.2 — AB (ref 2–6.9)
POC LYMPH PERCENT: 13.5 %L (ref 10–50)
POC MID %: 2.7 %M (ref 0–12)
Platelet Count, POC: 655 10*3/uL — AB (ref 142–424)
RBC: 4.38 M/uL (ref 4.04–5.48)
RDW, POC: 14.4 %
WBC: 14.5 10*3/uL — AB (ref 4.6–10.2)

## 2012-06-12 NOTE — Progress Notes (Signed)
Back is still sore.  Last week she had right radiating thigh pain.  She's been started on prednisone by Dr. Jillyn Hidden and is starting it today for 12 days.  Weakness continues.  Entire body.  Chronic right hip pain.  Also, hit little toe on left foot this week with toe angling out.   She put the toe back and is buddy taping it.  Objective:  NAD; antalgic gait  Results for orders placed in visit on 04/24/12  POCT CBC      Component Value Range   WBC 9.9  4.6 - 10.2 K/uL   Lymph, poc 2.9  0.6 - 3.4   POC LYMPH PERCENT 28.8  10 - 50 %L   MID (cbc) 0.8  0 - 0.9   POC MID % 8.2  0 - 12 %M   POC Granulocyte 6.2  2 - 6.9   Granulocyte percent 63.0  37 - 80 %G   RBC 4.44  4.04 - 5.48 M/uL   Hemoglobin 13.1  12.2 - 16.2 g/dL   HCT, POC 16.1  09.6 - 47.9 %   MCV 97.5 (*) 80 - 97 fL   MCH, POC 29.5  27 - 31.2 pg   MCHC 30.3 (*) 31.8 - 35.4 g/dL   RDW, POC 04.5     Platelet Count, POC 408  142 - 424 K/uL   MPV 9.3  0 - 99.8 fL  COMPREHENSIVE METABOLIC PANEL      Component Value Range   Sodium 144  135 - 145 mEq/L   Potassium 5.0  3.5 - 5.3 mEq/L   Chloride 103  96 - 112 mEq/L   CO2 26  19 - 32 mEq/L   Glucose, Bld 98  70 - 99 mg/dL   BUN 15  6 - 23 mg/dL   Creat 4.09 (*) 8.11 - 1.10 mg/dL   Total Bilirubin 0.4  0.3 - 1.2 mg/dL   Alkaline Phosphatase 76  39 - 117 U/L   AST 14  0 - 37 U/L   ALT 9  0 - 35 U/L   Total Protein 6.7  6.0 - 8.3 g/dL   Albumin 3.9  3.5 - 5.2 g/dL   Calcium 9.8  8.4 - 91.4 mg/dL  TSH      Component Value Range   TSH 2.358  0.350 - 4.500 uIU/mL  PRO B NATRIURETIC PEPTIDE      Component Value Range   Pro B Natriuretic peptide (BNP) 325.50  <451 pg/mL    Assessment:  The hemoglobin is stable.  We discussed the toe and her terrible problems with spinal stenosis  Plan: no change in treatment plan

## 2012-06-18 DIAGNOSIS — I868 Varicose veins of other specified sites: Secondary | ICD-10-CM | POA: Diagnosis not present

## 2012-06-22 DIAGNOSIS — B0239 Other herpes zoster eye disease: Secondary | ICD-10-CM | POA: Diagnosis not present

## 2012-06-22 DIAGNOSIS — B0230 Zoster ocular disease, unspecified: Secondary | ICD-10-CM | POA: Diagnosis not present

## 2012-06-24 ENCOUNTER — Encounter: Payer: Self-pay | Admitting: Family Medicine

## 2012-07-03 DIAGNOSIS — H2510 Age-related nuclear cataract, unspecified eye: Secondary | ICD-10-CM | POA: Diagnosis not present

## 2012-07-03 DIAGNOSIS — B0239 Other herpes zoster eye disease: Secondary | ICD-10-CM | POA: Diagnosis not present

## 2012-07-04 DIAGNOSIS — M5126 Other intervertebral disc displacement, lumbar region: Secondary | ICD-10-CM | POA: Diagnosis not present

## 2012-07-21 DIAGNOSIS — M25569 Pain in unspecified knee: Secondary | ICD-10-CM | POA: Diagnosis not present

## 2012-07-29 DIAGNOSIS — H2510 Age-related nuclear cataract, unspecified eye: Secondary | ICD-10-CM | POA: Diagnosis not present

## 2012-07-29 DIAGNOSIS — B0230 Zoster ocular disease, unspecified: Secondary | ICD-10-CM | POA: Diagnosis not present

## 2012-07-29 DIAGNOSIS — B0229 Other postherpetic nervous system involvement: Secondary | ICD-10-CM | POA: Diagnosis not present

## 2012-07-29 DIAGNOSIS — H20019 Primary iridocyclitis, unspecified eye: Secondary | ICD-10-CM | POA: Diagnosis not present

## 2012-07-30 ENCOUNTER — Telehealth: Payer: Self-pay

## 2012-07-30 NOTE — Telephone Encounter (Signed)
PT CALLED AND STATES WE REFERRED HER TO [facility]. THE DR AT [facility] IS TO GET IN TOUCH WITH DR Milus Glazier REGARDING A MEDICATION THAT NEEDS TO BE PRESCRIBED. PT WANTS TO KNOW IF THAT HAS HAPPENED YET? PLEASE CALL PT TO ADVISE

## 2012-07-30 NOTE — Telephone Encounter (Signed)
Do we need to refer to [facility]?

## 2012-07-31 ENCOUNTER — Other Ambulatory Visit: Payer: Self-pay | Admitting: Family Medicine

## 2012-07-31 DIAGNOSIS — H539 Unspecified visual disturbance: Secondary | ICD-10-CM

## 2012-07-31 NOTE — Telephone Encounter (Signed)
Referral has been made.

## 2012-07-31 NOTE — Telephone Encounter (Signed)
[facility] [address]  bf

## 2012-07-31 NOTE — Telephone Encounter (Signed)
Can patient get Lyrica rx for shingles?

## 2012-07-31 NOTE — Telephone Encounter (Signed)
Pt called today to find out if dr l rx'd the pain medication she needed from eye appt. After reading previous documentation, it seems the pts previous message was misunderstood. Pt states she went to her eye appt recently and her eye dr told her she had shingles. Pt states she has had it for about 6 weeks now. States her eye dr told her she should be on lyrica but he could not rx it. States he told her he would call dr Milus Glazier to discuss. Pt requests lyrica rx for shingles.  Please call pt with response.   Best: [phone]  bf

## 2012-08-01 ENCOUNTER — Ambulatory Visit (INDEPENDENT_AMBULATORY_CARE_PROVIDER_SITE_OTHER): Payer: Medicare Other | Admitting: Family Medicine

## 2012-08-01 VITALS — BP 108/48 | HR 90 | Temp 97.4°F | Resp 18 | Ht 63.0 in | Wt 175.0 lb

## 2012-08-01 DIAGNOSIS — Z23 Encounter for immunization: Secondary | ICD-10-CM

## 2012-08-01 DIAGNOSIS — D72829 Elevated white blood cell count, unspecified: Secondary | ICD-10-CM

## 2012-08-01 DIAGNOSIS — B0229 Other postherpetic nervous system involvement: Secondary | ICD-10-CM | POA: Diagnosis not present

## 2012-08-01 LAB — POCT CBC
Granulocyte percent: 70.7 %G (ref 37–80)
HCT, POC: 46.3 % (ref 37.7–47.9)
Hemoglobin: 13.9 g/dL (ref 12.2–16.2)
Lymph, poc: 2.7 (ref 0.6–3.4)
MCH, POC: 28.8 pg (ref 27–31.2)
MCHC: 30 g/dL — AB (ref 31.8–35.4)
MCV: 95.9 fL (ref 80–97)
MID (cbc): 0.7 (ref 0–0.9)
MPV: 9.4 fL (ref 0–99.8)
POC Granulocyte: 8.3 — AB (ref 2–6.9)
POC LYMPH PERCENT: 23.3 %L (ref 10–50)
POC MID %: 6 %M (ref 0–12)
Platelet Count, POC: 378 10*3/uL (ref 142–424)
RBC: 4.83 M/uL (ref 4.04–5.48)
RDW, POC: 14.4 %
WBC: 11.8 10*3/uL — AB (ref 4.6–10.2)

## 2012-08-01 MED ORDER — GABAPENTIN 300 MG PO CAPS: 300.0000 mg | ORAL_CAPSULE | Freq: Every day | ORAL | Status: DC

## 2012-08-01 NOTE — Patient Instructions (Addendum)
Shingles Shingles is caused by the same virus that causes chickenpox (varicella zoster virus or VZV). Shingles often occurs many years or decades after having chickenpox. That is why it is more common in adults older than 50 years. The virus reactivates and breaks out as an infection in a nerve root. SYMPTOMS   The initial feeling (sensations) may be pain. This pain is usually described as:  Burning.  Stabbing.  Throbbing.  Tingling in the nerve root.  A red rash will follow in a couple days. The rash may occur in any area of the body and is usually on one side (unilateral) of the body in a band or belt-like pattern. The rash usually starts out as very small blisters (vesicles). They will dry up after 7 to 10 days. This is not usually a significant problem except for the pain it causes.  Long-lasting (chronic) pain is more likely in an elderly person. It can last months to years. This condition is called postherpetic neuralgia. Shingles can be an extremely severe infection in someone with AIDS, a weakened immune system, or with forms of leukemia. It can also be severe if you are taking transplant medicines or other medicines that weaken the immune system. TREATMENT  Your caregiver will often treat you with:  Antiviral drugs.  Anti-inflammatory drugs.  Pain medicines. Bed rest is very important in preventing the pain associated with herpes zoster (postherpetic neuralgia). Application of heat in the form of a hot water bottle or electric heating pad or gentle pressure with the hand is recommended to help with the pain or discomfort. PREVENTION  A varicella zoster vaccine is available to help protect against the virus. The Food and Drug Administration approved the varicella zoster vaccine for individuals 13 years of age and older. HOME CARE INSTRUCTIONS   Cool compresses to the area of rash may be helpful.  Only take over-the-counter or prescription medicines for pain, discomfort, or  fever as directed by your caregiver.  Avoid contact with:  Babies.  Pregnant women.  Children with eczema.  Elderly people with transplants.  People with chronic illnesses, such as leukemia and AIDS.  If the area involved is on your face, you may receive a referral for follow-up to a specialist. It is very important to keep all follow-up appointments. This will help avoid eye complications, chronic pain, or disability. SEEK IMMEDIATE MEDICAL CARE IF:   You develop any pain (headache) in the area of the face or eye. This must be followed carefully by your caregiver or ophthalmologist. An infection in part of your eye (cornea) can be very serious. It could lead to blindness.  You do not have pain relief from prescribed medicines.  Your redness or swelling spreads.  The area involved becomes very swollen and painful.  You have a fever.  You notice any red or painful lines extending away from the affected area toward your heart (lymphangitis).  Your condition is worsening or has changed. Document Released: 09/30/2005 Document Revised: 12/23/2011 Document Reviewed: 09/04/2009 [facility] Patient Information 2013 [place], [place]. Results for orders placed in visit on 08/01/12  POCT CBC      Component Value Range   WBC 11.8 (*) 4.6 - 10.2 K/uL   Lymph, poc 2.7  0.6 - 3.4   POC LYMPH PERCENT 23.3  10 - 50 %L   MID (cbc) 0.7  0 - 0.9   POC MID % 6.0  0 - 12 %M   POC Granulocyte 8.3 (*) 2 - 6.9  Granulocyte percent 70.7  37 - 80 %G   RBC 4.83  4.04 - 5.48 M/uL   Hemoglobin 13.9  12.2 - 16.2 g/dL   HCT, POC 40.9  81.1 - 47.9 %   MCV 95.9  80 - 97 fL   MCH, POC 28.8  27 - 31.2 pg   MCHC 30.0 (*) 31.8 - 35.4 g/dL   RDW, POC 91.4     Platelet Count, POC 378  142 - 424 K/uL   MPV 9.4  0 - 99.8 fL

## 2012-08-01 NOTE — Progress Notes (Signed)
78 yo who has had right frontal shingles for 6 weeks.  She  Was diagnosed at [facility] and they referred her here because they won't prescribe anything for her.  Objective:  NAD Fading frontal rash that extends into scalp Results for orders placed in visit on 06/12/12  POCT CBC      Component Value Range   WBC 14.5 (*) 4.6 - 10.2 K/uL   Lymph, poc 2.0  0.6 - 3.4   POC LYMPH PERCENT 13.5  10 - 50 %L   MID (cbc) 0.4  0 - 0.9   POC MID % 2.7  0 - 12 %M   POC Granulocyte 12.2 (*) 2 - 6.9   Granulocyte percent 83.8 (*) 37 - 80 %G   RBC 4.38  4.04 - 5.48 M/uL   Hemoglobin 13.0  12.2 - 16.2 g/dL   HCT, POC 40.9  81.1 - 47.9 %   MCV 95.6  80 - 97 fL   MCH, POC 29.7  27 - 31.2 pg   MCHC 31.0 (*) 31.8 - 35.4 g/dL   RDW, POC 91.4     Platelet Count, POC 655 (*) 142 - 424 K/uL   MPV 9.9  0 - 99.8 fL  COMPREHENSIVE METABOLIC PANEL      Component Value Range   Sodium 138  135 - 145 mEq/L   Potassium 5.7 (*) 3.5 - 5.3 mEq/L   Chloride 102  96 - 112 mEq/L   CO2 28  19 - 32 mEq/L   Glucose, Bld 114 (*) 70 - 99 mg/dL   BUN 28 (*) 6 - 23 mg/dL   Creat 7.82 (*) 9.56 - 1.10 mg/dL   Total Bilirubin 0.3  0.3 - 1.2 mg/dL   Alkaline Phosphatase 102  39 - 117 U/L   AST 13  0 - 37 U/L   ALT 10  0 - 35 U/L   Total Protein 7.2  6.0 - 8.3 g/dL   Albumin 4.1  3.5 - 5.2 g/dL   Calcium 21.3  8.4 - 08.6 mg/dL    Assessment:  Postherpetic neuralgia  Plan:  neurontin 300 mg qhs. If not better, will need to double medicine Flu shot today 1. Postherpetic neuralgia  gabapentin (NEURONTIN) 300 MG capsule, POCT CBC  2. Leukocytosis, unspecified     Results for orders placed in visit on 08/01/12  POCT CBC      Component Value Range   WBC 11.8 (*) 4.6 - 10.2 K/uL   Lymph, poc 2.7  0.6 - 3.4   POC LYMPH PERCENT 23.3  10 - 50 %L   MID (cbc) 0.7  0 - 0.9   POC MID % 6.0  0 - 12 %M   POC Granulocyte 8.3 (*) 2 - 6.9   Granulocyte percent 70.7  37 - 80 %G   RBC 4.83  4.04 - 5.48 M/uL   Hemoglobin  13.9  12.2 - 16.2 g/dL   HCT, POC 57.8  46.9 - 47.9 %   MCV 95.9  80 - 97 fL   MCH, POC 28.8  27 - 31.2 pg   MCHC 30.0 (*) 31.8 - 35.4 g/dL   RDW, POC 62.9     Platelet Count, POC 378  142 - 424 K/uL   MPV 9.4  0 - 99.8 fL

## 2012-08-04 DIAGNOSIS — I4891 Unspecified atrial fibrillation: Secondary | ICD-10-CM | POA: Diagnosis not present

## 2012-08-04 LAB — BASIC METABOLIC PANEL
Creatinine: 1.2 mg/dL — AB (ref <=1.1)
Glucose: 101 mg/dL
Potassium: 5 mmol/L (ref 3.4–5.3)

## 2012-08-04 LAB — HEPATIC FUNCTION PANEL: ALT: 8 U/L (ref 7–35)

## 2012-08-04 LAB — CBC AND DIFFERENTIAL: WBC: 8.4 10^3/mL

## 2012-08-10 DIAGNOSIS — M25569 Pain in unspecified knee: Secondary | ICD-10-CM | POA: Diagnosis not present

## 2012-08-17 ENCOUNTER — Other Ambulatory Visit: Payer: Self-pay | Admitting: Family Medicine

## 2012-08-17 DIAGNOSIS — L570 Actinic keratosis: Secondary | ICD-10-CM | POA: Diagnosis not present

## 2012-08-17 DIAGNOSIS — G619 Inflammatory polyneuropathy, unspecified: Secondary | ICD-10-CM

## 2012-08-17 DIAGNOSIS — L821 Other seborrheic keratosis: Secondary | ICD-10-CM | POA: Diagnosis not present

## 2012-08-17 DIAGNOSIS — Z85828 Personal history of other malignant neoplasm of skin: Secondary | ICD-10-CM | POA: Diagnosis not present

## 2012-08-17 MED ORDER — GABAPENTIN 300 MG PO CAPS: 300.0000 mg | ORAL_CAPSULE | Freq: Two times a day (BID) | ORAL | Status: DC

## 2012-08-20 DIAGNOSIS — H2000 Unspecified acute and subacute iridocyclitis: Secondary | ICD-10-CM | POA: Diagnosis not present

## 2012-08-20 DIAGNOSIS — B0239 Other herpes zoster eye disease: Secondary | ICD-10-CM | POA: Diagnosis not present

## 2012-09-02 DIAGNOSIS — M545 Low back pain: Secondary | ICD-10-CM | POA: Diagnosis not present

## 2012-09-16 DIAGNOSIS — M545 Low back pain: Secondary | ICD-10-CM | POA: Diagnosis not present

## 2012-09-25 DIAGNOSIS — M5137 Other intervertebral disc degeneration, lumbosacral region: Secondary | ICD-10-CM | POA: Diagnosis not present

## 2012-09-25 DIAGNOSIS — M4310 Spondylolisthesis, site unspecified: Secondary | ICD-10-CM | POA: Diagnosis not present

## 2012-09-25 DIAGNOSIS — M545 Low back pain: Secondary | ICD-10-CM | POA: Diagnosis not present

## 2012-10-08 ENCOUNTER — Encounter: Payer: Self-pay | Admitting: *Deleted

## 2012-10-08 DIAGNOSIS — I4891 Unspecified atrial fibrillation: Secondary | ICD-10-CM

## 2012-10-09 ENCOUNTER — Telehealth: Payer: Self-pay | Admitting: *Deleted

## 2012-10-09 ENCOUNTER — Other Ambulatory Visit: Payer: Self-pay | Admitting: *Deleted

## 2012-10-09 DIAGNOSIS — G619 Inflammatory polyneuropathy, unspecified: Secondary | ICD-10-CM

## 2012-10-12 DIAGNOSIS — H2510 Age-related nuclear cataract, unspecified eye: Secondary | ICD-10-CM | POA: Diagnosis not present

## 2012-10-12 DIAGNOSIS — H5710 Ocular pain, unspecified eye: Secondary | ICD-10-CM | POA: Diagnosis not present

## 2012-10-12 DIAGNOSIS — B0230 Zoster ocular disease, unspecified: Secondary | ICD-10-CM | POA: Diagnosis not present

## 2012-10-15 ENCOUNTER — Telehealth: Payer: Self-pay
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
## 2012-10-16 ENCOUNTER — Other Ambulatory Visit: Payer: Self-pay | Admitting: Physician Assistant

## 2012-10-16 NOTE — Telephone Encounter (Signed)
She states she has been taking 300 mg one at night and one in the am has ran out. I called her to advise [facility].

## 2012-11-19 ENCOUNTER — Encounter: Payer: Self-pay | Admitting: *Deleted

## 2012-11-19 DIAGNOSIS — B0230 Zoster ocular disease, unspecified: Secondary | ICD-10-CM | POA: Insufficient documentation

## 2012-12-13 ENCOUNTER — Other Ambulatory Visit: Payer: Self-pay | Admitting: Physician Assistant

## 2012-12-21 DIAGNOSIS — Z85828 Personal history of other malignant neoplasm of skin: Secondary | ICD-10-CM | POA: Diagnosis not present

## 2012-12-21 DIAGNOSIS — R21 Rash and other nonspecific skin eruption: Secondary | ICD-10-CM | POA: Diagnosis not present

## 2013-01-12 DIAGNOSIS — R002 Palpitations: Secondary | ICD-10-CM | POA: Diagnosis not present

## 2013-01-12 DIAGNOSIS — I4891 Unspecified atrial fibrillation: Secondary | ICD-10-CM | POA: Diagnosis not present

## 2013-01-12 DIAGNOSIS — D649 Anemia, unspecified: Secondary | ICD-10-CM | POA: Diagnosis not present

## 2013-01-15 DIAGNOSIS — Z803 Family history of malignant neoplasm of breast: Secondary | ICD-10-CM | POA: Diagnosis not present

## 2013-01-15 DIAGNOSIS — Z1231 Encounter for screening mammogram for malignant neoplasm of breast: Secondary | ICD-10-CM | POA: Diagnosis not present

## 2013-01-21 DIAGNOSIS — I369 Nonrheumatic tricuspid valve disorder, unspecified: Secondary | ICD-10-CM | POA: Diagnosis not present

## 2013-01-21 DIAGNOSIS — I4891 Unspecified atrial fibrillation: Secondary | ICD-10-CM | POA: Diagnosis not present

## 2013-03-04 DIAGNOSIS — M48061 Spinal stenosis, lumbar region without neurogenic claudication: Secondary | ICD-10-CM | POA: Diagnosis not present

## 2013-03-04 DIAGNOSIS — M4310 Spondylolisthesis, site unspecified: Secondary | ICD-10-CM | POA: Diagnosis not present

## 2013-03-04 DIAGNOSIS — M545 Low back pain: Secondary | ICD-10-CM | POA: Diagnosis not present

## 2013-03-08 ENCOUNTER — Encounter: Payer: Self-pay | Admitting: Family Medicine

## 2013-03-08 ENCOUNTER — Ambulatory Visit (INDEPENDENT_AMBULATORY_CARE_PROVIDER_SITE_OTHER): Payer: Medicare Other | Admitting: Family Medicine

## 2013-03-08 VITALS — BP 84/46 | HR 92 | Temp 97.4°F | Resp 18 | Ht 63.0 in | Wt 168.0 lb

## 2013-03-08 DIAGNOSIS — T7840XA Allergy, unspecified, initial encounter: Secondary | ICD-10-CM | POA: Diagnosis not present

## 2013-03-08 DIAGNOSIS — R110 Nausea: Secondary | ICD-10-CM

## 2013-03-08 DIAGNOSIS — G894 Chronic pain syndrome: Secondary | ICD-10-CM | POA: Diagnosis not present

## 2013-03-08 MED ORDER — TRAMADOL HCL 50 MG PO TABS: 50.0000 mg | ORAL_TABLET | Freq: Three times a day (TID) | ORAL | Status: DC | PRN

## 2013-03-08 MED ORDER — PROMETHAZINE HCL 12.5 MG PO TABS: 12.5000 mg | ORAL_TABLET | Freq: Three times a day (TID) | ORAL | Status: DC | PRN

## 2013-03-08 MED ORDER — METHYLPREDNISOLONE ACETATE 80 MG/ML IJ SUSP
80.0000 mg | Freq: Once | INTRAMUSCULAR | Status: AC
  Administered 2013-03-08: 80 mg via INTRAMUSCULAR

## 2013-03-08 NOTE — Progress Notes (Signed)
Is an 79 year old woman with chronic atrial fibrillation and chronic back pain. She's been on Percocet for over year but because it causes itching, she was switched to Dilaudid. She's only taken 3 Dilantin last week and each time she takes it she gets severe itching. She hasn't slept in a couple nights because of pain and she is nauseated and still itching intermittently.  She's had no recent falls. She's had no chest pain or shortness of breath.  Objective: Patient appears pale and a bit anxious and the presence of her husband. She is appropriate and somewhat frustrated HEENT: Unremarkable Neck: Supple no adenopathy Chest: Clear Heart: Irregularly irregular with no murmur Abdomen: Soft nontender Skin: No rashes Extremities: No edema  Assessment this unfortunate woman has chronic scoliotic arthritis with daily pain which has been poorly controlled with narcotics to which she is allergic.  Plan: Will try tramadol 50 Q6, having Phenergan 12.5 every 6 when necessary. I'm giving her shot of Depo-Medrol 80 mg to try to use the itching situation and asked patient to contact me if the pain is not well controlled and 24 hours.  Signed, Rosalio Loud.D.

## 2013-03-16 DIAGNOSIS — I4891 Unspecified atrial fibrillation: Secondary | ICD-10-CM | POA: Diagnosis not present

## 2013-03-18 DIAGNOSIS — M4310 Spondylolisthesis, site unspecified: Secondary | ICD-10-CM | POA: Diagnosis not present

## 2013-03-18 DIAGNOSIS — M48061 Spinal stenosis, lumbar region without neurogenic claudication: Secondary | ICD-10-CM | POA: Diagnosis not present

## 2013-03-26 ENCOUNTER — Inpatient Hospital Stay (HOSPITAL_COMMUNITY)
Admission: EM | Admit: 2013-03-26 | Discharge: 2013-03-31 | DRG: 086 | Disposition: A | Discharge status: Skilled Nursing Facility | Payer: Medicare Other | Attending: General Surgery | Admitting: General Surgery

## 2013-03-26 ENCOUNTER — Emergency Department (HOSPITAL_COMMUNITY): Payer: Medicare Other

## 2013-03-26 ENCOUNTER — Encounter (HOSPITAL_COMMUNITY): Payer: Self-pay | Admitting: *Deleted

## 2013-03-26 DIAGNOSIS — F411 Generalized anxiety disorder: Secondary | ICD-10-CM | POA: Diagnosis present

## 2013-03-26 DIAGNOSIS — R279 Unspecified lack of coordination: Secondary | ICD-10-CM | POA: Diagnosis not present

## 2013-03-26 DIAGNOSIS — M353 Polymyalgia rheumatica: Secondary | ICD-10-CM | POA: Diagnosis present

## 2013-03-26 DIAGNOSIS — I4891 Unspecified atrial fibrillation: Secondary | ICD-10-CM | POA: Diagnosis not present

## 2013-03-26 DIAGNOSIS — L299 Pruritus, unspecified: Secondary | ICD-10-CM | POA: Diagnosis present

## 2013-03-26 DIAGNOSIS — Z79899 Other long term (current) drug therapy: Secondary | ICD-10-CM | POA: Diagnosis not present

## 2013-03-26 DIAGNOSIS — K219 Gastro-esophageal reflux disease without esophagitis: Secondary | ICD-10-CM | POA: Diagnosis present

## 2013-03-26 DIAGNOSIS — S52599A Other fractures of lower end of unspecified radius, initial encounter for closed fracture: Secondary | ICD-10-CM | POA: Diagnosis present

## 2013-03-26 DIAGNOSIS — R404 Transient alteration of awareness: Secondary | ICD-10-CM | POA: Diagnosis not present

## 2013-03-26 DIAGNOSIS — M545 Low back pain, unspecified: Secondary | ICD-10-CM | POA: Diagnosis not present

## 2013-03-26 DIAGNOSIS — M129 Arthropathy, unspecified: Secondary | ICD-10-CM | POA: Diagnosis present

## 2013-03-26 DIAGNOSIS — S6990XA Unspecified injury of unspecified wrist, hand and finger(s), initial encounter: Secondary | ICD-10-CM | POA: Diagnosis not present

## 2013-03-26 DIAGNOSIS — W19XXXA Unspecified fall, initial encounter: Secondary | ICD-10-CM | POA: Diagnosis present

## 2013-03-26 DIAGNOSIS — M1990 Unspecified osteoarthritis, unspecified site: Secondary | ICD-10-CM | POA: Diagnosis not present

## 2013-03-26 DIAGNOSIS — M4800 Spinal stenosis, site unspecified: Secondary | ICD-10-CM | POA: Diagnosis not present

## 2013-03-26 DIAGNOSIS — D509 Iron deficiency anemia, unspecified: Secondary | ICD-10-CM | POA: Diagnosis present

## 2013-03-26 DIAGNOSIS — F329 Major depressive disorder, single episode, unspecified: Secondary | ICD-10-CM | POA: Diagnosis present

## 2013-03-26 DIAGNOSIS — S199XXA Unspecified injury of neck, initial encounter: Secondary | ICD-10-CM | POA: Diagnosis not present

## 2013-03-26 DIAGNOSIS — S065X9A Traumatic subdural hemorrhage with loss of consciousness of unspecified duration, initial encounter: Secondary | ICD-10-CM

## 2013-03-26 DIAGNOSIS — R55 Syncope and collapse: Secondary | ICD-10-CM | POA: Diagnosis not present

## 2013-03-26 DIAGNOSIS — G8929 Other chronic pain: Secondary | ICD-10-CM | POA: Diagnosis present

## 2013-03-26 DIAGNOSIS — M6281 Muscle weakness (generalized): Secondary | ICD-10-CM | POA: Diagnosis not present

## 2013-03-26 DIAGNOSIS — Z7902 Long term (current) use of antithrombotics/antiplatelets: Secondary | ICD-10-CM

## 2013-03-26 DIAGNOSIS — I10 Essential (primary) hypertension: Secondary | ICD-10-CM | POA: Diagnosis present

## 2013-03-26 DIAGNOSIS — S0993XA Unspecified injury of face, initial encounter: Secondary | ICD-10-CM | POA: Diagnosis not present

## 2013-03-26 DIAGNOSIS — G319 Degenerative disease of nervous system, unspecified: Secondary | ICD-10-CM | POA: Diagnosis present

## 2013-03-26 DIAGNOSIS — Z7982 Long term (current) use of aspirin: Secondary | ICD-10-CM

## 2013-03-26 DIAGNOSIS — S62109A Fracture of unspecified carpal bone, unspecified wrist, initial encounter for closed fracture: Secondary | ICD-10-CM

## 2013-03-26 DIAGNOSIS — S62101A Fracture of unspecified carpal bone, right wrist, initial encounter for closed fracture: Secondary | ICD-10-CM

## 2013-03-26 DIAGNOSIS — F0390 Unspecified dementia without behavioral disturbance: Secondary | ICD-10-CM | POA: Diagnosis present

## 2013-03-26 DIAGNOSIS — Z9181 History of falling: Secondary | ICD-10-CM

## 2013-03-26 DIAGNOSIS — S59909A Unspecified injury of unspecified elbow, initial encounter: Secondary | ICD-10-CM | POA: Diagnosis not present

## 2013-03-26 DIAGNOSIS — I6200 Nontraumatic subdural hemorrhage, unspecified: Secondary | ICD-10-CM | POA: Diagnosis not present

## 2013-03-26 DIAGNOSIS — S52501A Unspecified fracture of the lower end of right radius, initial encounter for closed fracture: Secondary | ICD-10-CM

## 2013-03-26 DIAGNOSIS — S0990XA Unspecified injury of head, initial encounter: Secondary | ICD-10-CM | POA: Diagnosis not present

## 2013-03-26 DIAGNOSIS — F3289 Other specified depressive episodes: Secondary | ICD-10-CM | POA: Diagnosis present

## 2013-03-26 DIAGNOSIS — D649 Anemia, unspecified: Secondary | ICD-10-CM | POA: Diagnosis not present

## 2013-03-26 DIAGNOSIS — S065XAA Traumatic subdural hemorrhage with loss of consciousness status unknown, initial encounter: Secondary | ICD-10-CM | POA: Diagnosis not present

## 2013-03-26 DIAGNOSIS — Z96659 Presence of unspecified artificial knee joint: Secondary | ICD-10-CM

## 2013-03-26 DIAGNOSIS — Z8739 Personal history of other diseases of the musculoskeletal system and connective tissue: Secondary | ICD-10-CM | POA: Diagnosis not present

## 2013-03-26 DIAGNOSIS — R262 Difficulty in walking, not elsewhere classified: Secondary | ICD-10-CM | POA: Diagnosis not present

## 2013-03-26 DIAGNOSIS — S5290XD Unspecified fracture of unspecified forearm, subsequent encounter for closed fracture with routine healing: Secondary | ICD-10-CM | POA: Diagnosis not present

## 2013-03-26 LAB — COMPREHENSIVE METABOLIC PANEL
ALT: 7 U/L (ref 0–35)
AST: 14 U/L (ref 0–37)
Albumin: 3.1 g/dL — ABNORMAL LOW (ref 3.5–5.2)
Alkaline Phosphatase: 89 U/L (ref 39–117)
Chloride: 102 mEq/L (ref 96–112)
Potassium: 3.8 mEq/L (ref 3.5–5.1)
Total Bilirubin: 0.4 mg/dL (ref 0.3–1.2)

## 2013-03-26 LAB — CBC WITH DIFFERENTIAL/PLATELET
Basophils Absolute: 0.1 10*3/uL (ref 0.0–0.1)
Basophils Relative: 0 % (ref 0–1)
Hemoglobin: 11.3 g/dL — ABNORMAL LOW (ref 12.0–15.0)
MCHC: 30.8 g/dL (ref 30.0–36.0)
Monocytes Relative: 6 % (ref 3–12)
Neutro Abs: 12.5 10*3/uL — ABNORMAL HIGH (ref 1.7–7.7)
Neutrophils Relative %: 82 % — ABNORMAL HIGH (ref 43–77)
RDW: 15.2 % (ref 11.5–15.5)

## 2013-03-26 LAB — MRSA PCR SCREENING: MRSA by PCR: NEGATIVE

## 2013-03-26 MED ORDER — ONDANSETRON HCL 4 MG PO TABS: 4.0000 mg | ORAL_TABLET | Freq: Four times a day (QID) | ORAL | Status: DC | PRN

## 2013-03-26 MED ORDER — PANTOPRAZOLE SODIUM 40 MG IV SOLR
40.0000 mg | Freq: Every day | INTRAVENOUS | Status: DC
Start: 2013-03-26 — End: 2013-03-29
  Filled 2013-03-26 (×3): qty 40

## 2013-03-26 MED ORDER — BISACODYL 10 MG RE SUPP: 10.0000 mg | Freq: Every day | RECTAL | Status: DC | PRN

## 2013-03-26 MED ORDER — DILTIAZEM HCL ER COATED BEADS 180 MG PO TB24
180.0000 mg | ORAL_TABLET | Freq: Every day | ORAL | Status: DC
  Administered 2013-03-26 – 2013-03-31 (×6): 180 mg via ORAL
  Filled 2013-03-26 (×7): qty 1

## 2013-03-26 MED ORDER — PANTOPRAZOLE SODIUM 40 MG PO TBEC
40.0000 mg | DELAYED_RELEASE_TABLET | Freq: Every day | ORAL | Status: DC
  Administered 2013-03-27 – 2013-03-29 (×3): 40 mg via ORAL
  Filled 2013-03-26 (×3): qty 1

## 2013-03-26 MED ORDER — POTASSIUM CHLORIDE IN NACL 20-0.45 MEQ/L-% IV SOLN
INTRAVENOUS | Status: DC
  Filled 2013-03-26: qty 1000

## 2013-03-26 MED ORDER — SODIUM CHLORIDE 0.9 % IV SOLN
INTRAVENOUS | Status: DC
  Administered 2013-03-26 – 2013-03-28 (×4): via INTRAVENOUS

## 2013-03-26 MED ORDER — MORPHINE SULFATE 2 MG/ML IJ SOLN
2.0000 mg | Freq: Once | INTRAMUSCULAR | Status: AC
  Administered 2013-03-26: 2 mg via INTRAVENOUS
  Filled 2013-03-26: qty 1

## 2013-03-26 MED ORDER — HYDROCODONE-ACETAMINOPHEN 5-325 MG PO TABS
1.0000 | ORAL_TABLET | ORAL | Status: DC | PRN
Start: 2013-03-26 — End: 2013-03-30
  Administered 2013-03-27 (×4): 1 via ORAL
  Filled 2013-03-26 (×4): qty 1

## 2013-03-26 MED ORDER — ACETAMINOPHEN 325 MG PO TABS
650.0000 mg | ORAL_TABLET | ORAL | Status: DC | PRN
  Administered 2013-03-27 – 2013-03-29 (×4): 650 mg via ORAL

## 2013-03-26 MED ORDER — ACETAMINOPHEN 325 MG PO TABS
650.0000 mg | ORAL_TABLET | ORAL | Status: DC | PRN
  Filled 2013-03-26 (×4): qty 2

## 2013-03-26 MED ORDER — DOCUSATE SODIUM 100 MG PO CAPS
100.0000 mg | ORAL_CAPSULE | Freq: Two times a day (BID) | ORAL | Status: DC
  Administered 2013-03-27 – 2013-03-31 (×9): 100 mg via ORAL
  Filled 2013-03-26 (×10): qty 1

## 2013-03-26 MED ORDER — TRAMADOL HCL 50 MG PO TABS
50.0000 mg | ORAL_TABLET | Freq: Three times a day (TID) | ORAL | Status: DC | PRN
  Filled 2013-03-26: qty 1

## 2013-03-26 MED ORDER — WHITE PETROLATUM GEL
Status: AC
  Administered 2013-03-26: 0.2
  Filled 2013-03-26: qty 5

## 2013-03-26 MED ORDER — PROMETHAZINE HCL 25 MG PO TABS: 12.5000 mg | ORAL_TABLET | Freq: Three times a day (TID) | ORAL | Status: DC | PRN

## 2013-03-26 MED ORDER — ONDANSETRON HCL 4 MG/2ML IJ SOLN: 4.0000 mg | Freq: Four times a day (QID) | INTRAMUSCULAR | Status: DC | PRN

## 2013-03-26 MED ORDER — DULOXETINE HCL 30 MG PO CPEP
30.0000 mg | ORAL_CAPSULE | Freq: Every evening | ORAL | Status: DC
  Administered 2013-03-26 – 2013-03-30 (×5): 30 mg via ORAL
  Filled 2013-03-26 (×7): qty 1
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
## 2013-03-26 NOTE — ED Notes (Signed)
[id number]<AV> Expected date:<BR> Expected time:<BR> Means of arrival:<BR> Comments:<BR> Fall- laceration

## 2013-03-26 NOTE — ED Notes (Signed)
Per EMS, pt from home, reports had a syncopal episode fell face down on the carpet.  Pt is on plavix.  Abrasions noted on pt's nose, corner of L eye, and L cheek.  Pt also reports R wrist pain-swelling noted per EMS.  Pt is bleeding from her nose, no active bleeding at this time.  Pt is A&Ox 4.
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
## 2013-03-26 NOTE — ED Notes (Signed)
MD at bedside.  Ezzard Standing MD

## 2013-03-26 NOTE — ED Notes (Signed)
[facility] en route. Report given. °
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
## 2013-03-27 ENCOUNTER — Inpatient Hospital Stay (HOSPITAL_COMMUNITY): Payer: Medicare Other

## 2013-03-27 DIAGNOSIS — I4891 Unspecified atrial fibrillation: Secondary | ICD-10-CM | POA: Diagnosis not present

## 2013-03-27 DIAGNOSIS — S62109A Fracture of unspecified carpal bone, unspecified wrist, initial encounter for closed fracture: Secondary | ICD-10-CM | POA: Diagnosis not present

## 2013-03-27 DIAGNOSIS — S065X9A Traumatic subdural hemorrhage with loss of consciousness of unspecified duration, initial encounter: Secondary | ICD-10-CM | POA: Diagnosis not present

## 2013-03-27 DIAGNOSIS — S52599A Other fractures of lower end of unspecified radius, initial encounter for closed fracture: Secondary | ICD-10-CM | POA: Diagnosis not present

## 2013-03-27 DIAGNOSIS — I6200 Nontraumatic subdural hemorrhage, unspecified: Secondary | ICD-10-CM | POA: Diagnosis not present

## 2013-03-27 LAB — BASIC METABOLIC PANEL
Calcium: 8.5 mg/dL (ref 8.4–10.5)
Creatinine, Ser: 1.15 mg/dL — ABNORMAL HIGH (ref 0.50–1.10)
GFR calc Af Amer: 48 mL/min — ABNORMAL LOW (ref >90)
GFR calc non Af Amer: 41 mL/min — ABNORMAL LOW (ref >90)
Sodium: 138 mEq/L (ref 135–145)

## 2013-03-27 LAB — CBC
MCH: 24.9 pg — ABNORMAL LOW (ref 26.0–34.0)
MCHC: 30.7 g/dL (ref 30.0–36.0)
MCV: 80.9 fL (ref 78.0–100.0)
Platelets: 339 10*3/uL (ref 150–400)
RBC: 4.3 MIL/uL (ref 3.87–5.11)
RDW: 15.6 % — ABNORMAL HIGH (ref 11.5–15.5)

## 2013-03-27 MED ORDER — HALOPERIDOL LACTATE 5 MG/ML IJ SOLN
5.0000 mg | Freq: Once | INTRAMUSCULAR | Status: AC
  Administered 2013-03-27: 5 mg via INTRAVENOUS
  Filled 2013-03-27: qty 1
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
## 2013-03-27 NOTE — Progress Notes (Signed)
Subjective: Pt awake and alert pleasant but confused according to daughter.  Right wrist pain.  Objective: Vital signs in last 24 hours: Temp:  [98.1 F (36.7 C)-98.7 F (37.1 C)] 98.1 F (36.7 C) (06/14 0727) Pulse Rate:  [51-134] 67 (06/14 1200) Resp:  [13-21] 20 (06/14 1200) BP: (105-154)/(55-123) 105/67 mmHg (06/14 1200) SpO2:  [93 %-97 %] 97 % (06/14 1200) Last BM Date: 03/27/13  Intake/Output from previous day: 06/13 0701 - 06/14 0700 In: 600 [I.V.:600] Out: -  Intake/Output this shift: Total I/O In: 375 [I.V.:375] Out: 575 [Urine:575]  Resp: clear to auscultation bilaterally Cardio: regularly irregular rhythm Extremities: right wrist in splint Neurologic: Mental status: alertness: alert, orientation: time Motor: grossly normal Some mild confusion but able to answer questions appropriately  Pupils equal  Reactive.  Lab Results:   Recent Labs  03/26/13 1536 03/27/13 0525  WBC 15.2* 12.5*  HGB 11.3* 10.7*  HCT 36.7 34.8*  PLT 397 339   BMET  Recent Labs  03/26/13 1536 03/27/13 0525  NA 140 138  K 3.8 4.1  CL 102 103  CO2 25 25  GLUCOSE 145* 105*  BUN 21 26*  CREATININE 1.27* 1.15*  CALCIUM 9.0 8.5   PT/INR  Recent Labs  03/26/13 1536  LABPROT 12.4  INR 0.93   ABG No results found for this basename: PHART, PCO2, PO2, HCO3,  in the last 72 hours  Studies/Results: Dg Wrist Complete Right  03/26/2013   *RADIOLOGY REPORT*  Clinical Data: Fall  RIGHT WRIST - COMPLETE 3+ VIEW  Comparison: None.  Findings: Possible nondisplaced fracture distal radius.  There is a transverse line across the distal radius which may be an impacted fracture without displacement.  This cannot be confirmed on other views.  Advanced degenerative change in the radiocarpal joint with joint space narrowing and spurring of the distal radius and scaphoid.  No other fracture.  IMPRESSION: Possible nondisplaced fracture distal radius.   Original Report Authenticated By: Janeece Riggers, M.D.   Ct Head Without Contrast  03/27/2013   *RADIOLOGY REPORT*  Clinical Data: Follow-up subdural hemorrhage.  CT HEAD WITHOUT CONTRAST  Technique:  Contiguous axial images were obtained from the base of the skull through the vertex without contrast.  Comparison: 03/26/2013  Findings: The subdural hemorrhage along the right frontal lobe roughly measures 5 mm in thickness and this is unchanged.  Again noted is subdural hemorrhage in the right temporal extra-axial space.  Again noted is blood along the right side of the tentorium which has minimally changed.  The blood along the right side of the posterior falx is denser but not significantly changed for size. There is no significant midline shift.  Again noted is diffuse low density throughout the subcortical and periventricular white matter suggesting chronic changes.  Stable size and appearance of the ventricles and basal cisterns.  There is now a small focus of blood within the left lateral ventricle.  No acute bony abnormality.  IMPRESSION: No significant change in the distribution or amount of subdural blood.  There is now a small focus of blood in the left lateral ventricle.  Evidence for chronic small vessel ischemic changes   Original Report Authenticated By: Richarda Overlie, M.D.   Ct Head Wo Contrast  03/26/2013   *RADIOLOGY REPORT*  Clinical Data:  Trauma  CT HEAD WITHOUT CONTRAST CT MAXILLOFACIAL WITHOUT CONTRAST CT CERVICAL SPINE WITHOUT CONTRAST  Technique:  Multidetector CT imaging of the head, cervical spine, and maxillofacial structures were performed using the standard  protocol without intravenous contrast. Multiplanar CT image reconstructions of the cervical spine and maxillofacial structures were also generated.  Comparison:   None  CT HEAD  Findings: Right frontal temporal subdural hematoma measures 5 mm. Small interhemispheric subdural hematoma is present posteriorly. There is blood layering along the tentorium compatible with subdural  hematoma.  Generalized atrophy with chronic microvascular ischemia.  No acute infarct or mass.  No significant midline shift.  Negative for skull fracture.  IMPRESSION: Right frontal temporal subdural hematoma.  Interhemispheric subdural hematoma.  Right tentorial subdural hematoma.  No significant midline shift.  Atrophy and chronic microvascular ischemia.  CT MAXILLOFACIAL  Findings:  Negative for facial fracture.  Negative for fracture of the mandible or orbit.  Paranasal sinuses are clear.  Extensive dermal calcification is noted due to chronic infection. Degenerative change in the temporal mandibular joint bilaterally.  IMPRESSION: Negative for facial fracture.  CT CERVICAL SPINE  Findings:   Cervical levoscoliosis.  Negative for fracture  Extensive disc degeneration and spondylosis throughout the cervical spine.  There is facet degeneration, most prominent on the right side.  Mild spinal stenosis at L4-5 and L5-S1 due to spurring.  IMPRESSION: Advanced cervical degenerative change.  Negative for cervical fracture.   Original Report Authenticated By: Janeece Riggers, M.D.   Ct Cervical Spine Wo Contrast  03/26/2013   *RADIOLOGY REPORT*  Clinical Data:  Trauma  CT HEAD WITHOUT CONTRAST CT MAXILLOFACIAL WITHOUT CONTRAST CT CERVICAL SPINE WITHOUT CONTRAST  Technique:  Multidetector CT imaging of the head, cervical spine, and maxillofacial structures were performed using the standard protocol without intravenous contrast. Multiplanar CT image reconstructions of the cervical spine and maxillofacial structures were also generated.  Comparison:   None  CT HEAD  Findings: Right frontal temporal subdural hematoma measures 5 mm. Small interhemispheric subdural hematoma is present posteriorly. There is blood layering along the tentorium compatible with subdural hematoma.  Generalized atrophy with chronic microvascular ischemia.  No acute infarct or mass.  No significant midline shift.  Negative for skull fracture.   IMPRESSION: Right frontal temporal subdural hematoma.  Interhemispheric subdural hematoma.  Right tentorial subdural hematoma.  No significant midline shift.  Atrophy and chronic microvascular ischemia.  CT MAXILLOFACIAL  Findings:  Negative for facial fracture.  Negative for fracture of the mandible or orbit.  Paranasal sinuses are clear.  Extensive dermal calcification is noted due to chronic infection. Degenerative change in the temporal mandibular joint bilaterally.  IMPRESSION: Negative for facial fracture.  CT CERVICAL SPINE  Findings:   Cervical levoscoliosis.  Negative for fracture  Extensive disc degeneration and spondylosis throughout the cervical spine.  There is facet degeneration, most prominent on the right side.  Mild spinal stenosis at L4-5 and L5-S1 due to spurring.  IMPRESSION: Advanced cervical degenerative change.  Negative for cervical fracture.   Original Report Authenticated By: Janeece Riggers, M.D.   Ct Maxillofacial Wo Cm  03/26/2013   *RADIOLOGY REPORT*  Clinical Data:  Trauma  CT HEAD WITHOUT CONTRAST CT MAXILLOFACIAL WITHOUT CONTRAST CT CERVICAL SPINE WITHOUT CONTRAST  Technique:  Multidetector CT imaging of the head, cervical spine, and maxillofacial structures were performed using the standard protocol without intravenous contrast. Multiplanar CT image reconstructions of the cervical spine and maxillofacial structures were also generated.  Comparison:   None  CT HEAD  Findings: Right frontal temporal subdural hematoma measures 5 mm. Small interhemispheric subdural hematoma is present posteriorly. There is blood layering along the tentorium compatible with subdural hematoma.  Generalized atrophy with chronic microvascular ischemia.  No acute infarct or mass.  No significant midline shift.  Negative for skull fracture.  IMPRESSION: Right frontal temporal subdural hematoma.  Interhemispheric subdural hematoma.  Right tentorial subdural hematoma.  No significant midline shift.  Atrophy and  chronic microvascular ischemia.  CT MAXILLOFACIAL  Findings:  Negative for facial fracture.  Negative for fracture of the mandible or orbit.  Paranasal sinuses are clear.  Extensive dermal calcification is noted due to chronic infection. Degenerative change in the temporal mandibular joint bilaterally.  IMPRESSION: Negative for facial fracture.  CT CERVICAL SPINE  Findings:   Cervical levoscoliosis.  Negative for fracture  Extensive disc degeneration and spondylosis throughout the cervical spine.  There is facet degeneration, most prominent on the right side.  Mild spinal stenosis at L4-5 and L5-S1 due to spurring.  IMPRESSION: Advanced cervical degenerative change.  Negative for cervical fracture.   Original Report Authenticated By: Janeece Riggers, M.D.    Anti-infectives: Anti-infectives   None      Assessment/Plan: Patient Active Problem List   Diagnosis Date Noted  . Herpes zoster ophthalmicus 11/19/2012  . Polymyalgia rheumatica 12/08/2011  . Weakness 11/30/2011  . GERD (gastroesophageal reflux disease) 11/29/2011  . Hypertension 11/29/2011  . Sinus arrhythmia 11/29/2011  . Iron deficiency anemia 11/29/2011  . Anxiety and depression 11/29/2011  . Atrial fibrillation 11/29/2011  FALL  SDH per [facility] Right wrist fracture - per ortho   Keep in ICU for monitoring for transient confusion. Hold plavix Start diet Neuro checks A fib  Restart meds.  Right distal radial fracture stable.   LOS: 1 day    Andy Allende A. 03/27/2013

## 2013-03-27 NOTE — Progress Notes (Signed)
Subjective: Patient reports doing well.  Objective: Vital signs in last 24 hours: Temp:  [98.1 F (36.7 C)-98.7 F (37.1 C)] 98.1 F (36.7 C) (06/14 0727) Pulse Rate:  [51-134] 73 (06/14 1000) Resp:  [13-21] 17 (06/14 1000) BP: (127-154)/(55-123) 151/123 mmHg (06/14 1000) SpO2:  [93 %-97 %] 95 % (06/14 1000)  Intake/Output from previous day: 06/13 0701 - 06/14 0700 In: 600 [I.V.:600] Out: -  Intake/Output this shift: Total I/O In: 225 [I.V.:225] Out: 525 [Urine:525]  Physical Exam: Alert, oriented.  MAEW.   Lab Results:  Recent Labs  03/26/13 1536 03/27/13 0525  WBC 15.2* 12.5*  HGB 11.3* 10.7*  HCT 36.7 34.8*  PLT 397 339   BMET  Recent Labs  03/26/13 1536 03/27/13 0525  NA 140 138  K 3.8 4.1  CL 102 103  CO2 25 25  GLUCOSE 145* 105*  BUN 21 26*  CREATININE 1.27* 1.15*  CALCIUM 9.0 8.5    Studies/Results: Dg Wrist Complete Right  03/26/2013   *RADIOLOGY REPORT*  Clinical Data: Fall  RIGHT WRIST - COMPLETE 3+ VIEW  Comparison: None.  Findings: Possible nondisplaced fracture distal radius.  There is a transverse line across the distal radius which may be an impacted fracture without displacement.  This cannot be confirmed on other views.  Advanced degenerative change in the radiocarpal joint with joint space narrowing and spurring of the distal radius and scaphoid.  No other fracture.  IMPRESSION: Possible nondisplaced fracture distal radius.   Original Report Authenticated By: Janeece Riggers, M.D.   Ct Head Without Contrast  03/27/2013   *RADIOLOGY REPORT*  Clinical Data: Follow-up subdural hemorrhage.  CT HEAD WITHOUT CONTRAST  Technique:  Contiguous axial images were obtained from the base of the skull through the vertex without contrast.  Comparison: 03/26/2013  Findings: The subdural hemorrhage along the right frontal lobe roughly measures 5 mm in thickness and this is unchanged.  Again noted is subdural hemorrhage in the right temporal extra-axial space.   Again noted is blood along the right side of the tentorium which has minimally changed.  The blood along the right side of the posterior falx is denser but not significantly changed for size. There is no significant midline shift.  Again noted is diffuse low density throughout the subcortical and periventricular white matter suggesting chronic changes.  Stable size and appearance of the ventricles and basal cisterns.  There is now a small focus of blood within the left lateral ventricle.  No acute bony abnormality.  IMPRESSION: No significant change in the distribution or amount of subdural blood.  There is now a small focus of blood in the left lateral ventricle.  Evidence for chronic small vessel ischemic changes   Original Report Authenticated By: Richarda Overlie, M.D.   Ct Head Wo Contrast  03/26/2013   *RADIOLOGY REPORT*  Clinical Data:  Trauma  CT HEAD WITHOUT CONTRAST CT MAXILLOFACIAL WITHOUT CONTRAST CT CERVICAL SPINE WITHOUT CONTRAST  Technique:  Multidetector CT imaging of the head, cervical spine, and maxillofacial structures were performed using the standard protocol without intravenous contrast. Multiplanar CT image reconstructions of the cervical spine and maxillofacial structures were also generated.  Comparison:   None  CT HEAD  Findings: Right frontal temporal subdural hematoma measures 5 mm. Small interhemispheric subdural hematoma is present posteriorly. There is blood layering along the tentorium compatible with subdural hematoma.  Generalized atrophy with chronic microvascular ischemia.  No acute infarct or mass.  No significant midline shift.  Negative for skull fracture.  IMPRESSION: Right frontal temporal subdural hematoma.  Interhemispheric subdural hematoma.  Right tentorial subdural hematoma.  No significant midline shift.  Atrophy and chronic microvascular ischemia.  CT MAXILLOFACIAL  Findings:  Negative for facial fracture.  Negative for fracture of the mandible or orbit.  Paranasal sinuses  are clear.  Extensive dermal calcification is noted due to chronic infection. Degenerative change in the temporal mandibular joint bilaterally.  IMPRESSION: Negative for facial fracture.  CT CERVICAL SPINE  Findings:   Cervical levoscoliosis.  Negative for fracture  Extensive disc degeneration and spondylosis throughout the cervical spine.  There is facet degeneration, most prominent on the right side.  Mild spinal stenosis at L4-5 and L5-S1 due to spurring.  IMPRESSION: Advanced cervical degenerative change.  Negative for cervical fracture.   Original Report Authenticated By: Janeece Riggers, M.D.   Ct Cervical Spine Wo Contrast  03/26/2013   *RADIOLOGY REPORT*  Clinical Data:  Trauma  CT HEAD WITHOUT CONTRAST CT MAXILLOFACIAL WITHOUT CONTRAST CT CERVICAL SPINE WITHOUT CONTRAST  Technique:  Multidetector CT imaging of the head, cervical spine, and maxillofacial structures were performed using the standard protocol without intravenous contrast. Multiplanar CT image reconstructions of the cervical spine and maxillofacial structures were also generated.  Comparison:   None  CT HEAD  Findings: Right frontal temporal subdural hematoma measures 5 mm. Small interhemispheric subdural hematoma is present posteriorly. There is blood layering along the tentorium compatible with subdural hematoma.  Generalized atrophy with chronic microvascular ischemia.  No acute infarct or mass.  No significant midline shift.  Negative for skull fracture.  IMPRESSION: Right frontal temporal subdural hematoma.  Interhemispheric subdural hematoma.  Right tentorial subdural hematoma.  No significant midline shift.  Atrophy and chronic microvascular ischemia.  CT MAXILLOFACIAL  Findings:  Negative for facial fracture.  Negative for fracture of the mandible or orbit.  Paranasal sinuses are clear.  Extensive dermal calcification is noted due to chronic infection. Degenerative change in the temporal mandibular joint bilaterally.  IMPRESSION:  Negative for facial fracture.  CT CERVICAL SPINE  Findings:   Cervical levoscoliosis.  Negative for fracture  Extensive disc degeneration and spondylosis throughout the cervical spine.  There is facet degeneration, most prominent on the right side.  Mild spinal stenosis at L4-5 and L5-S1 due to spurring.  IMPRESSION: Advanced cervical degenerative change.  Negative for cervical fracture.   Original Report Authenticated By: Janeece Riggers, M.D.   Ct Maxillofacial Wo Cm  03/26/2013   *RADIOLOGY REPORT*  Clinical Data:  Trauma  CT HEAD WITHOUT CONTRAST CT MAXILLOFACIAL WITHOUT CONTRAST CT CERVICAL SPINE WITHOUT CONTRAST  Technique:  Multidetector CT imaging of the head, cervical spine, and maxillofacial structures were performed using the standard protocol without intravenous contrast. Multiplanar CT image reconstructions of the cervical spine and maxillofacial structures were also generated.  Comparison:   None  CT HEAD  Findings: Right frontal temporal subdural hematoma measures 5 mm. Small interhemispheric subdural hematoma is present posteriorly. There is blood layering along the tentorium compatible with subdural hematoma.  Generalized atrophy with chronic microvascular ischemia.  No acute infarct or mass.  No significant midline shift.  Negative for skull fracture.  IMPRESSION: Right frontal temporal subdural hematoma.  Interhemispheric subdural hematoma.  Right tentorial subdural hematoma.  No significant midline shift.  Atrophy and chronic microvascular ischemia.  CT MAXILLOFACIAL  Findings:  Negative for facial fracture.  Negative for fracture of the mandible or orbit.  Paranasal sinuses are clear.  Extensive dermal calcification is noted due to chronic infection. Degenerative  change in the temporal mandibular joint bilaterally.  IMPRESSION: Negative for facial fracture.  CT CERVICAL SPINE  Findings:   Cervical levoscoliosis.  Negative for fracture  Extensive disc degeneration and spondylosis throughout the  cervical spine.  There is facet degeneration, most prominent on the right side.  Mild spinal stenosis at L4-5 and L5-S1 due to spurring.  IMPRESSION: Advanced cervical degenerative change.  Negative for cervical fracture.   Original Report Authenticated By: Janeece Riggers, M.D.    Assessment/Plan: Head CT stable.  OK to mobilize and transfer from ICU.  Hold aspirin and plavix and have patient follow up with primary care regarding risks/benefits of anticoagulation.    LOS: 1 day    Dorian Heckle, MD 03/27/2013, 10:38 AM

## 2013-03-27 NOTE — Progress Notes (Signed)
Pt is unable to void. Bladder scan showed 433 ml of urine. Dr. Dwain Sarna is notified and order received.

## 2013-03-28 DIAGNOSIS — S62109A Fracture of unspecified carpal bone, unspecified wrist, initial encounter for closed fracture: Secondary | ICD-10-CM | POA: Diagnosis not present

## 2013-03-28 DIAGNOSIS — S065X9A Traumatic subdural hemorrhage with loss of consciousness of unspecified duration, initial encounter: Secondary | ICD-10-CM | POA: Diagnosis not present

## 2013-03-28 DIAGNOSIS — I4891 Unspecified atrial fibrillation: Secondary | ICD-10-CM | POA: Diagnosis not present

## 2013-03-28 NOTE — Progress Notes (Signed)
Subjective: Feels ok, tol clears, some confusion last night treated with haldol, she had some preexisting dementia but this has been exacerbated  Objective: Vital signs in last 24 hours: Temp:  [98.1 F (36.7 C)-98.9 F (37.2 C)] 98.5 F (36.9 C) (06/15 0800) Pulse Rate:  [52-92] 92 (06/15 0200) Resp:  [15-22] 16 (06/15 0900) BP: (86-149)/(40-105) 120/89 mmHg (06/15 0900) SpO2:  [92 %-98 %] 93 % (06/15 0900) Last BM Date: 03/26/13  Intake/Output from previous day: 06/14 0701 - 06/15 0700 In: 2700 [P.O.:900; I.V.:1800] Out: 935 [Urine:935] Intake/Output this shift: Total I/O In: 150 [I.V.:150] Out: -   General appearance: no distress Resp: clear to auscultation bilaterally Cardio: irregularly irregular rhythm GI: soft nontender Neurologic: Grossly normal  Lab Results:   Recent Labs  03/26/13 1536 03/27/13 0525  WBC 15.2* 12.5*  HGB 11.3* 10.7*  HCT 36.7 34.8*  PLT 397 339   BMET  Recent Labs  03/26/13 1536 03/27/13 0525  NA 140 138  K 3.8 4.1  CL 102 103  CO2 25 25  GLUCOSE 145* 105*  BUN 21 26*  CREATININE 1.27* 1.15*  CALCIUM 9.0 8.5   PT/INR  Recent Labs  03/26/13 1536  LABPROT 12.4  INR 0.93   ABG No results found for this basename: PHART, PCO2, PO2, HCO3,  in the last 72 hours  Studies/Results: Dg Wrist Complete Right  03/26/2013   *RADIOLOGY REPORT*  Clinical Data: Fall  RIGHT WRIST - COMPLETE 3+ VIEW  Comparison: None.  Findings: Possible nondisplaced fracture distal radius.  There is a transverse line across the distal radius which may be an impacted fracture without displacement.  This cannot be confirmed on other views.  Advanced degenerative change in the radiocarpal joint with joint space narrowing and spurring of the distal radius and scaphoid.  No other fracture.  IMPRESSION: Possible nondisplaced fracture distal radius.   Original Report Authenticated By: Janeece Riggers, M.D.   Ct Head Without Contrast  03/27/2013   *RADIOLOGY  REPORT*  Clinical Data: Follow-up subdural hemorrhage.  CT HEAD WITHOUT CONTRAST  Technique:  Contiguous axial images were obtained from the base of the skull through the vertex without contrast.  Comparison: 03/26/2013  Findings: The subdural hemorrhage along the right frontal lobe roughly measures 5 mm in thickness and this is unchanged.  Again noted is subdural hemorrhage in the right temporal extra-axial space.  Again noted is blood along the right side of the tentorium which has minimally changed.  The blood along the right side of the posterior falx is denser but not significantly changed for size. There is no significant midline shift.  Again noted is diffuse low density throughout the subcortical and periventricular white matter suggesting chronic changes.  Stable size and appearance of the ventricles and basal cisterns.  There is now a small focus of blood within the left lateral ventricle.  No acute bony abnormality.  IMPRESSION: No significant change in the distribution or amount of subdural blood.  There is now a small focus of blood in the left lateral ventricle.  Evidence for chronic small vessel ischemic changes   Original Report Authenticated By: Richarda Overlie, M.D.   Ct Head Wo Contrast  03/26/2013   *RADIOLOGY REPORT*  Clinical Data:  Trauma  CT HEAD WITHOUT CONTRAST CT MAXILLOFACIAL WITHOUT CONTRAST CT CERVICAL SPINE WITHOUT CONTRAST  Technique:  Multidetector CT imaging of the head, cervical spine, and maxillofacial structures were performed using the standard protocol without intravenous contrast. Multiplanar CT image reconstructions of the cervical  spine and maxillofacial structures were also generated.  Comparison:   None  CT HEAD  Findings: Right frontal temporal subdural hematoma measures 5 mm. Small interhemispheric subdural hematoma is present posteriorly. There is blood layering along the tentorium compatible with subdural hematoma.  Generalized atrophy with chronic microvascular ischemia.   No acute infarct or mass.  No significant midline shift.  Negative for skull fracture.  IMPRESSION: Right frontal temporal subdural hematoma.  Interhemispheric subdural hematoma.  Right tentorial subdural hematoma.  No significant midline shift.  Atrophy and chronic microvascular ischemia.  CT MAXILLOFACIAL  Findings:  Negative for facial fracture.  Negative for fracture of the mandible or orbit.  Paranasal sinuses are clear.  Extensive dermal calcification is noted due to chronic infection. Degenerative change in the temporal mandibular joint bilaterally.  IMPRESSION: Negative for facial fracture.  CT CERVICAL SPINE  Findings:   Cervical levoscoliosis.  Negative for fracture  Extensive disc degeneration and spondylosis throughout the cervical spine.  There is facet degeneration, most prominent on the right side.  Mild spinal stenosis at L4-5 and L5-S1 due to spurring.  IMPRESSION: Advanced cervical degenerative change.  Negative for cervical fracture.   Original Report Authenticated By: Janeece Riggers, M.D.   Ct Cervical Spine Wo Contrast  03/26/2013   *RADIOLOGY REPORT*  Clinical Data:  Trauma  CT HEAD WITHOUT CONTRAST CT MAXILLOFACIAL WITHOUT CONTRAST CT CERVICAL SPINE WITHOUT CONTRAST  Technique:  Multidetector CT imaging of the head, cervical spine, and maxillofacial structures were performed using the standard protocol without intravenous contrast. Multiplanar CT image reconstructions of the cervical spine and maxillofacial structures were also generated.  Comparison:   None  CT HEAD  Findings: Right frontal temporal subdural hematoma measures 5 mm. Small interhemispheric subdural hematoma is present posteriorly. There is blood layering along the tentorium compatible with subdural hematoma.  Generalized atrophy with chronic microvascular ischemia.  No acute infarct or mass.  No significant midline shift.  Negative for skull fracture.  IMPRESSION: Right frontal temporal subdural hematoma.  Interhemispheric  subdural hematoma.  Right tentorial subdural hematoma.  No significant midline shift.  Atrophy and chronic microvascular ischemia.  CT MAXILLOFACIAL  Findings:  Negative for facial fracture.  Negative for fracture of the mandible or orbit.  Paranasal sinuses are clear.  Extensive dermal calcification is noted due to chronic infection. Degenerative change in the temporal mandibular joint bilaterally.  IMPRESSION: Negative for facial fracture.  CT CERVICAL SPINE  Findings:   Cervical levoscoliosis.  Negative for fracture  Extensive disc degeneration and spondylosis throughout the cervical spine.  There is facet degeneration, most prominent on the right side.  Mild spinal stenosis at L4-5 and L5-S1 due to spurring.  IMPRESSION: Advanced cervical degenerative change.  Negative for cervical fracture.   Original Report Authenticated By: Janeece Riggers, M.D.   Ct Maxillofacial Wo Cm  03/26/2013   *RADIOLOGY REPORT*  Clinical Data:  Trauma  CT HEAD WITHOUT CONTRAST CT MAXILLOFACIAL WITHOUT CONTRAST CT CERVICAL SPINE WITHOUT CONTRAST  Technique:  Multidetector CT imaging of the head, cervical spine, and maxillofacial structures were performed using the standard protocol without intravenous contrast. Multiplanar CT image reconstructions of the cervical spine and maxillofacial structures were also generated.  Comparison:   None  CT HEAD  Findings: Right frontal temporal subdural hematoma measures 5 mm. Small interhemispheric subdural hematoma is present posteriorly. There is blood layering along the tentorium compatible with subdural hematoma.  Generalized atrophy with chronic microvascular ischemia.  No acute infarct or mass.  No significant midline shift.  Negative for skull fracture.  IMPRESSION: Right frontal temporal subdural hematoma.  Interhemispheric subdural hematoma.  Right tentorial subdural hematoma.  No significant midline shift.  Atrophy and chronic microvascular ischemia.  CT MAXILLOFACIAL  Findings:  Negative  for facial fracture.  Negative for fracture of the mandible or orbit.  Paranasal sinuses are clear.  Extensive dermal calcification is noted due to chronic infection. Degenerative change in the temporal mandibular joint bilaterally.  IMPRESSION: Negative for facial fracture.  CT CERVICAL SPINE  Findings:   Cervical levoscoliosis.  Negative for fracture  Extensive disc degeneration and spondylosis throughout the cervical spine.  There is facet degeneration, most prominent on the right side.  Mild spinal stenosis at L4-5 and L5-S1 due to spurring.  IMPRESSION: Advanced cervical degenerative change.  Negative for cervical fracture.   Original Report Authenticated By: Janeece Riggers, M.D.    Anti-infectives: Anti-infectives   None      Assessment/Plan: [facility]  1. Neuro- I think she has some concussive sx on her baseline dementia as well as some sundowning.  Will need to monitor but no indication for repeat ct.  Discussed with daughter today. 2. Cv/pulm cont home meds, no plavix/asa 3. Gi advance diet 4. Dc foley, may be oob with assistance 5. Therapies can see tomorrow 6. scds 7. tx to stepdown  [facility] 03/28/2013

## 2013-03-28 NOTE — Progress Notes (Signed)
Pt  uncooperative ,verbally and physically combative attempting to climb out of bed and pulling at line and tubes. Dr Corliss Skains paged and informed. Haldol 5mg   IV x 1 ordered. RN 1:1 with patient. We will continue to monitor.

## 2013-03-29 DIAGNOSIS — S065X9A Traumatic subdural hemorrhage with loss of consciousness of unspecified duration, initial encounter: Secondary | ICD-10-CM | POA: Diagnosis not present

## 2013-03-29 DIAGNOSIS — I4891 Unspecified atrial fibrillation: Secondary | ICD-10-CM | POA: Diagnosis not present

## 2013-03-29 DIAGNOSIS — S62109A Fracture of unspecified carpal bone, unspecified wrist, initial encounter for closed fracture: Secondary | ICD-10-CM | POA: Diagnosis not present

## 2013-03-29 MED ORDER — DIGOXIN 125 MCG PO TABS
0.1250 mg | ORAL_TABLET | Freq: Every day | ORAL | Status: DC
  Administered 2013-03-29 – 2013-03-31 (×3): 0.125 mg via ORAL
  Filled 2013-03-29 (×4): qty 1

## 2013-03-29 NOTE — Progress Notes (Signed)
Patient ID: Diane Mejia, female   DOB: [birth year], 79 y.o.   MRN: [id number]    Subjective: Feeling better this AM, asking about going home  Objective: Vital signs in last 24 hours: Temp:  [97.6 F (36.4 C)-98.2 F (36.8 C)] 97.6 F (36.4 C) (06/16 0808) Resp:  [16-22] 22 (06/16 0800) BP: (97-131)/(47-104) 120/76 mmHg (06/16 0800) SpO2:  [91 %-98 %] 94 % (06/16 0800) Last BM Date: 03/29/13  Intake/Output from previous day: 06/15 0701 - 06/16 0700 In: 1530.4 [P.O.:240; I.V.:1290.4] Out: 825 [Urine:825] Intake/Output this shift: Total I/O In: 100 [I.V.:100] Out: 601 [Urine:600; Stool:1]  Head: evolving B periorbital ecchymoses Resp: clear to auscultation bilaterally Cardio: regular rate and rhythm GI: soft, NT, ND, +BS Extremities: RUE splint - fingers warm and sensate Neuro: A&O, F/C X 4ext  Lab Results: CBC   Recent Labs  03/26/13 1536 03/27/13 0525  WBC 15.2* 12.5*  HGB 11.3* 10.7*  HCT 36.7 34.8*  PLT 397 339   BMET  Recent Labs  03/26/13 1536 03/27/13 0525  NA 140 138  K 3.8 4.1  CL 102 103  CO2 25 25  GLUCOSE 145* 105*  BUN 21 26*  CREATININE 1.27* 1.15*  CALCIUM 9.0 8.5   PT/INR  Recent Labs  03/26/13 1536  LABPROT 12.4  INR 0.93   ABG No results found for this basename: PHART, PCO2, PO2, HCO3,  in the last 72 hours  Studies/Results: No results found.  Anti-infectives: Anti-infectives   None      Assessment/Plan: Fall SDH - MS has improved, TBI team therapies to evaluate R wrist FX - F/U as outpatient with Dr. Orlan Leavens FEN - tolerating diet A fib - digoxin home dose VTE - PAS To floor I spoke with her husband and daughter   LOS: 3 days    Violeta Gelinas, MD, MPH, FACS Pager: [phone]  03/29/2013

## 2013-03-29 NOTE — Progress Notes (Signed)
Physical Therapy Evaluation Patient Details Name: Diane Mejia MRN: [id number] DOB: 1922-11-12 Today's Date: 03/29/2013 Time: 3086-5784 PT Time Calculation (min): 21 min  PT Assessment / Plan / Recommendation Clinical Impression  Pt s/p fall with SDH and right wrist fracture.  Will benefit from PT to address balaance and endurance issues. Will need NHP for therapy.      PT Assessment  Patient needs continued PT services    Follow Up Recommendations  SNF;Supervision/Assistance - 24 hour                Equipment Recommendations  None recommended by PT         Frequency Min 3X/week    Precautions / Restrictions Precautions Precautions: Fall Required Braces or Orthoses: Other Brace/Splint (sling right UE) Restrictions Weight Bearing Restrictions: Yes RUE Weight Bearing: Non weight bearing   Pertinent Vitals/Pain VSS, No pain      Mobility  Transfers Transfers: Sit to Stand;Stand to Sit Sit to Stand: 3: Mod assist;From elevated surface;With upper extremity assist;From bed Stand to Sit: 4: Min assist;With upper extremity assist;To chair/3-in-1;With armrests Details for Transfer Assistance: Pt needed cues for hand placement as well as steadying assist upon initial stand.  Pt needed HHA for stability. Ambulation/Gait Ambulation/Gait Assistance: 1: +2 Total assist Ambulation/Gait: Patient Percentage: 70% Ambulation Distance (Feet): 150 Feet Assistive device: 1 person hand held assist Ambulation/Gait Assistance Details: Pt ambulated with left HHA with unequal step length with staggering steps at times.  Needs left UE support and is a fall risk secondary to postural instability.   Gait Pattern: Step-to pattern;Decreased stride length;Shuffle;Ataxic;Narrow base of support Gait velocity: decreased Stairs: No Wheelchair Mobility Wheelchair Mobility: No         PT Diagnosis: Generalized weakness  PT Problem List: Decreased activity tolerance;Decreased balance;Decreased  mobility;Decreased knowledge of use of DME;Decreased safety awareness;Decreased knowledge of precautions;Pain PT Treatment Interventions: DME instruction;Gait training;Functional mobility training;Therapeutic activities;Therapeutic exercise;Balance training;Neuromuscular re-education;Cognitive remediation;Patient/family education   PT Goals Acute Rehab PT Goals PT Goal Formulation: With patient/family Time For Goal Achievement: 04/12/13 Potential to Achieve Goals: Fair Pt will go Supine/Side to Sit: with supervision PT Goal: Supine/Side to Sit - Progress: Goal set today Pt will go Sit to Stand: with supervision PT Goal: Sit to Stand - Progress: Goal set today Pt will Transfer Bed to Chair/Chair to Bed: with supervision PT Transfer Goal: Bed to Chair/Chair to Bed - Progress: Goal set today Pt will Ambulate: 51 - 150 feet;with least restrictive assistive device;with min assist PT Goal: Ambulate - Progress: Goal set today  Visit Information  Last PT Received On: 03/29/13 Assistance Needed: +2    Subjective Data  Subjective: "I just am unsteady." Patient Stated Goal: to go get therapy and then go home   Prior Functioning  Home Living Lives With: Spouse Available Help at Discharge: Family;Available 24 hours/day Type of Home: House Home Access: Stairs to enter [employer] of Steps: 3 Entrance Stairs-Rails: Can reach both Home Layout: One level Bathroom Shower/Tub: Tub/shower unit;Curtain Firefighter: Standard Home Adaptive Equipment: Grab bars in shower;Straight cane;Walker - rolling;Walker - four wheeled Additional Comments: Over the last few weeks, decline in function per family Prior Function Level of Independence: Independent with assistive device(s) (but lost balance a lot recently) Able to Take Stairs?: Yes Driving: Yes Vocation: Retired Musician: No difficulties Dominant Hand: Right    Cognition  Cognition Arousal/Alertness:  Awake/alert Behavior During Therapy: WFL for tasks assessed/performed Overall Cognitive Status: History of cognitive impairments - at baseline  Extremity/Trunk Assessment Right Lower Extremity Assessment RLE ROM/Strength/Tone: WFL for tasks assessed Left Lower Extremity Assessment LLE ROM/Strength/Tone: WFL for tasks assessed Trunk Assessment Trunk Assessment: Kyphotic   Balance Balance Balance Assessed: Yes Static Standing Balance Static Standing - Balance Support: Bilateral upper extremity supported;During functional activity Static Standing - Level of Assistance: 4: Min assist Static Standing - Comment/# of Minutes: 2 minutes - needs constant HHA for stability.  End of Session PT - End of Session Equipment Utilized During Treatment: Gait belt Activity Tolerance: Patient limited by fatigue Patient left: in chair;with call bell/phone within reach;with family/visitor present Nurse Communication: Mobility status       INGOLD,Chevelle Durr 03/29/2013, 4:30 PM [facility] Acute Rehabilitation [phone] (pager)

## 2013-03-29 NOTE — Clinical Social Work Note (Signed)
Clinical Social Work Department BRIEF PSYCHOSOCIAL ASSESSMENT 03/29/2013  Patient:  Diane Mejia, Diane Mejia     Account Number:  [id number]     Admit date:  03/26/2013  Clinical Social Worker:  Verl Blalock  Date/Time:  03/29/2013 11:30 AM  Referred by:  Physician  Date Referred:  03/29/2013 Referred for  Psychosocial assessment  SNF Placement   Other Referral:   Interview type:  Patient Other interview type:   Patient husband and daughter at bedside    PSYCHOSOCIAL DATA Living Status:  HUSBAND Admitted from facility:   Level of care:   Primary support name:  Diane Mejia, Diane Mejia  [phone]  [phone] Primary support relationship to patient:  SPOUSE Degree of support available:   Strong    CURRENT CONCERNS Current Concerns  Post-Acute Placement   Other Concerns:    SOCIAL WORK ASSESSMENT / PLAN Clinical Social Worker met with patient and family at bedside to offer support and discuss patient needs at discharge.  Patient states that she currently lives at home with her husband who is available to assist 24 hours a day as needed.  Patient daughter is familiar with skilled nursing facilities in the area.  Patient is not accepting of SNF placement yet, but willing to SNF search in [facility].  Patient family is encouraging patient to rehab options prior to return home.  Patient and family are hopeful that patient may be an appropriate candidate for inpatient rehab following PT/OT evaluation.  CSW will initiate search and follow up with available bed offers. CSW will also continue to follow for PT/OT recommendations prior to facilitating patient discharge needs.    Clinical Social Worker will inquire about possible current substance use at a later time.  Patient is currently unhappy with the idea of a ST-SNF stay.  CSW will remain available for emotional support and to facilitate patient discharge needs once medically stable.   Assessment/plan status:  Psychosocial Support/Ongoing  Assessment of Needs Other assessment/ plan:   Information/referral to community resources:   Patient daughter very adamant that she is aware of local facilities and shows preference to [facility] - no facility list provided to family at this time.    PATIENT'S/FAMILY'S RESPONSE TO PLAN OF CARE: Patient alert and oriented sitting up in bed with family at bedside.  Patient seems to have appropriate family support. Patient and her husband have been together for 66 years - patient husband willing and prepared to care for patient as needed if able at home.  Patient willing to look into all discharge options, however not completely accepting of the idea of placement at this time.  Patient and family verbalized their appreciation for CSW involvement.

## 2013-03-29 NOTE — Clinical Social Work Placement (Addendum)
Clinical Social Work Department CLINICAL SOCIAL WORK PLACEMENT NOTE 03/29/2013  Patient:  Diane Mejia, Diane Mejia  Account Number:  [id number] Admit date:  03/26/2013  Clinical Social Worker:  Macario Golds, LCSW  Date/time:  03/29/2013 12:00 N  Clinical Social Work is seeking post-discharge placement for this patient at the following level of care:   SKILLED NURSING   (*CSW will update this form in Epic as items are completed)   03/29/2013  Patient/family provided with [facility] Department of Clinical Social Work's list of facilities offering this level of care within the geographic area requested by the patient (or if unable, by the patient's family).  03/29/2013  Patient/family informed of their freedom to choose among providers that offer the needed level of care, that participate in Medicare, Medicaid or managed care program needed by the patient, have an available bed and are willing to accept the patient.  03/29/2013  Patient/family informed of [facility]' ownership interest in [facility], as well as of the fact that they are under no obligation to receive care at this facility.  PASARR submitted to EDS on  Pre-existing PASARR number received from EDS on   FL2 transmitted to all facilities in geographic area requested by pt/family on  03/29/2013 FL2 transmitted to all facilities within larger geographic area on   Patient informed that his/her managed care company has contracts with or will negotiate with  certain facilities, including the following:     Patient/family informed of bed offers received:  03/31/2013  Patient chooses bed at   [facility] Physician recommends and patient chooses bed at    Patient to be transferred to [facility] on 03/31/2013   Patient to be transferred to facility by  [facility]  The following physician request were entered in Epic:   Additional Comments: 06/16 - preference to [facility]

## 2013-03-29 NOTE — Progress Notes (Signed)
Arrived to room 6n25, Oriented to room and surroundings, family at bedside. Denies nausea/pain

## 2013-03-29 NOTE — Progress Notes (Signed)
Subjective: Patient reports feeling better  Objective: Vital signs in last 24 hours: Temp:  [97.6 F (36.4 C)-98.2 F (36.8 C)] 97.6 F (36.4 C) (06/16 0808) Resp:  [16-22] 22 (06/16 0800) BP: (97-131)/(47-104) 120/76 mmHg (06/16 0800) SpO2:  [91 %-98 %] 94 % (06/16 0800)  Intake/Output from previous day: 06/15 0701 - 06/16 0700 In: 1530.4 [P.O.:240; I.V.:1290.4] Out: 825 [Urine:825] Intake/Output this shift: Total I/O In: 100 [I.V.:100] Out: 601 [Urine:600; Stool:1]  Physical Exam: Awake, alert, conversant.  No longer confused.  MAEW to command.  Lab Results:  Recent Labs  03/26/13 1536 03/27/13 0525  WBC 15.2* 12.5*  HGB 11.3* 10.7*  HCT 36.7 34.8*  PLT 397 339   BMET  Recent Labs  03/26/13 1536 03/27/13 0525  NA 140 138  K 3.8 4.1  CL 102 103  CO2 25 25  GLUCOSE 145* 105*  BUN 21 26*  CREATININE 1.27* 1.15*  CALCIUM 9.0 8.5    Studies/Results: No results found.  Assessment/Plan: Doing well. No sun-downing last night.  Tx to regular room.  Mobilize with PT.  Call with questions.    LOS: 3 days    Dorian Heckle, MD 03/29/2013, 10:03 AM

## 2013-03-30 DIAGNOSIS — S065X9A Traumatic subdural hemorrhage with loss of consciousness of unspecified duration, initial encounter: Secondary | ICD-10-CM

## 2013-03-30 DIAGNOSIS — S62101A Fracture of unspecified carpal bone, right wrist, initial encounter for closed fracture: Secondary | ICD-10-CM

## 2013-03-30 DIAGNOSIS — W19XXXA Unspecified fall, initial encounter: Secondary | ICD-10-CM

## 2013-03-30 DIAGNOSIS — S62109A Fracture of unspecified carpal bone, unspecified wrist, initial encounter for closed fracture: Secondary | ICD-10-CM | POA: Diagnosis not present

## 2013-03-30 DIAGNOSIS — I4891 Unspecified atrial fibrillation: Secondary | ICD-10-CM | POA: Diagnosis not present

## 2013-03-30 MED ORDER — TRAMADOL HCL 50 MG PO TABS: 50.0000 mg | ORAL_TABLET | Freq: Three times a day (TID) | ORAL | Status: DC | PRN

## 2013-03-30 NOTE — Progress Notes (Signed)
Patient ID: Norm Parcel, female   DOB: 19-Jun-1923, 79 y.o.   MRN: [id number]   LOS: 4 days   Subjective: Doing well.   Objective: Vital signs in last 24 hours: Temp:  [97.4 F (36.3 C)-98.4 F (36.9 C)] 97.6 F (36.4 C) (06/17 0546) Pulse Rate:  [79-97] 90 (06/17 0546) Resp:  [17-21] 18 (06/17 0546) BP: (115-143)/(52-84) 125/72 mmHg (06/17 0546) SpO2:  [96 %-100 %] 99 % (06/17 0546) Weight:  [166 lb 0.1 oz (75.3 kg)] 166 lb 0.1 oz (75.3 kg) (06/16 1141) Last BM Date: 03/29/13   Physical Exam General appearance: alert and no distress Resp: clear to auscultation bilaterally Cardio: irregularly irregular rhythm GI: normal findings: bowel sounds normal and soft, non-tender Extremities: NVI   Assessment/Plan: Fall  SDH - TBI team R wrist FX - F/U as outpatient with Dr. Orlan Leavens  A fib - digoxin home dose  FEN - tolerating diet  VTE - SCD's Dispo -- Wife/husband would like to return home but son adamantly wants at least ST SNF. Pt agrees to being faxed out and will make decision on home vs SNF once offers are known.    Freeman Caldron, PA-C Pager: [phone] General Trauma PA Pager: [phone]   03/30/2013

## 2013-03-30 NOTE — Progress Notes (Signed)
Physical Therapy Treatment Patient Details Name: Diane Mejia MRN: [id number] DOB: 10/22/1922 Today's Date: 03/30/2013 Time: 4098-1191 PT Time Calculation (min): 24 min  PT Assessment / Plan / Recommendation Comments on Treatment Session   Pt is progressing with mobility at this date however she still requires assistance for balance with standing & ambulating.  Recommend SNF at d/c to maximize functional mobility & safety.     Follow Up Recommendations  SNF;Supervision/Assistance - 24 hour     Does the patient have the potential to tolerate intense rehabilitation     Barriers to Discharge        Equipment Recommendations  None recommended by PT    Recommendations for Other Services    Frequency Min 3X/week   Plan Discharge plan remains appropriate;Frequency remains appropriate    Precautions / Restrictions Precautions Precautions: Fall Required Braces or Orthoses: Other Brace/Splint (Sling RUE) Restrictions RUE Weight Bearing: Non weight bearing       Mobility  Bed Mobility Bed Mobility: Supine to Sit;Sitting - Scoot to Edge of Bed Supine to Sit: 4: Min guard;HOB flat Sitting - Scoot to Edge of Bed: 4: Min guard Details for Bed Mobility Assistance: Cues to reinforce NWBing RUE.  Transfers Transfers: Sit to Stand;Stand to Sit Sit to Stand: 3: Mod assist;With upper extremity assist;From bed (bench in hallway) Stand to Sit: 4: Min assist;With upper extremity assist;With armrests;To chair/3-in-1;Other (comment) (bench in hallway) Details for Transfer Assistance: cues for use of LUE.  Performed sit<>stand 5x's for strengthening, technique,  & activity tolerance.   (A) for stability with standing & controlled descent.   Ambulation/Gait Ambulation/Gait Assistance: 4: Min assist Ambulation Distance (Feet): 200 Feet Assistive device: 1 person hand held assist Ambulation/Gait Assistance Details: (A) for steadiness via Left HHA.   Gait Pattern: Step-through pattern;Decreased  stride length;Narrow base of support (decreased step height) Stairs: No Wheelchair Mobility Wheelchair Mobility: No    Exercises General Exercises - Lower Extremity Long Arc Quad: AROM;Strengthening;Both;10 reps;Seated Hip Flexion/Marching: AROM;Strengthening;Both;10 reps;Seated Toe Raises: AROM;Both;10 reps;Seated Heel Raises: AROM;Both;10 reps;Seated     PT Goals Acute Rehab PT Goals Time For Goal Achievement: 04/12/13 Potential to Achieve Goals: Fair Pt will go Supine/Side to Sit: with supervision PT Goal: Supine/Side to Sit - Progress: Progressing toward goal Pt will go Sit to Stand: with supervision PT Goal: Sit to Stand - Progress: Progressing toward goal Pt will Transfer Bed to Chair/Chair to Bed: with supervision Pt will Ambulate: 51 - 150 feet;with least restrictive assistive device;with min assist PT Goal: Ambulate - Progress: Progressing toward goal  Visit Information  Last PT Received On: 03/30/13 Assistance Needed: +1    Subjective Data      Cognition  Cognition Arousal/Alertness: Awake/alert Behavior During Therapy: WFL for tasks assessed/performed Overall Cognitive Status: History of cognitive impairments - at baseline    Balance     End of Session PT - End of Session Equipment Utilized During Treatment: Gait belt Activity Tolerance: Patient tolerated treatment well Patient left: in chair;with call bell/phone within reach;with family/visitor present Nurse Communication: Mobility status     Verdell Face, [place] [phone] 03/30/2013

## 2013-03-30 NOTE — Progress Notes (Signed)
Refused restart of IVF after pulling it out; reoriented to place, time and situation

## 2013-03-30 NOTE — Progress Notes (Signed)
UR completed 

## 2013-03-30 NOTE — Progress Notes (Signed)
Subjective: Patient reports "I feel like I'm doing better"  Objective: Vital signs in last 24 hours: Temp:  [97.4 F (36.3 C)-98.4 F (36.9 C)] 97.6 F (36.4 C) (06/17 0546) Pulse Rate:  [79-97] 90 (06/17 0546) Resp:  [17-21] 18 (06/17 0546) BP: (115-143)/(52-84) 125/72 mmHg (06/17 0546) SpO2:  [96 %-100 %] 99 % (06/17 0546) Weight:  [75.3 kg (166 lb 0.1 oz)] 75.3 kg (166 lb 0.1 oz) (06/16 1141)  Intake/Output from previous day: 06/16 0701 - 06/17 0700 In: 390 [P.O.:240; I.V.:150] Out: 851 [Urine:850; Stool:1] Intake/Output this shift:    Alert, conversant. Son & husband present. Pt doesn't remember which hospital she's in, but is otherwise oriented. Family notes frequency and duration of confusion has decreased significantly in last 48hrs. Occasionally forgetful now. PEARL. RUE splinted, moving other extremities. Fluent speech.  Lab Results: No results found for this basename: WBC, HGB, HCT, PLT,  in the last 72 hours BMET No results found for this basename: NA, K, CL, CO2, GLUCOSE, BUN, CREATININE, CALCIUM,  in the last 72 hours  Studies/Results: No results found.  Assessment/Plan: Improving   LOS: 4 days  Continue support; mobilize with PT; SNF.   Georgiann Cocker 03/30/2013, 8:10 AM

## 2013-03-30 NOTE — Plan of Care (Signed)
Problem: Phase II Progression Outcomes Goal: Discharge plan established Pt and family plan to d/c to SNF after acute care d/c, OT agrees with this plan

## 2013-03-30 NOTE — Evaluation (Signed)
Occupational Therapy Evaluation Patient Details Name: Diane Mejia MRN: [id number] DOB: 03-26-1923 Today's Date: 03/30/2013 Time: 4098-1191 OT Time Calculation (min): 22 min  OT Assessment / Plan / Recommendation Clinical Impression  Pt demos decline in function with ADLs and ADL mobility safety following R wrist fx and SDH from falling at home. All education comlpeted and no further acute OT services needed at this time. Pt to d/c to SNF today or tomorrow    OT Assessment  All further OT needs can be met in the next venue of care    Follow Up Recommendations  SNF    Barriers to Discharge  Pt's husband unable to provide adequate care/assist for pt at this time    Equipment Recommendations  None recommended by OT;Other (comment) (TBD at SNF level of care)    Recommendations for Other Services    Frequency       Precautions / Restrictions Precautions Precautions: Fall Required Braces or Orthoses: Other Brace/Splint Other Brace/Splint: Sling R UE Restrictions Weight Bearing Restrictions: Yes RUE Weight Bearing: Non weight bearing       ADL  Grooming: Performed;Wash/dry hands;Wash/dry face;Minimal assistance Where Assessed - Grooming: Supported standing Upper Body Bathing: Simulated;Moderate assistance Lower Body Bathing: Simulated;Maximal assistance Upper Body Dressing: Performed;Moderate assistance Lower Body Dressing: Performed;Maximal assistance Toilet Transfer: Performed;Minimal assistance Toilet Transfer Method: Sit to stand Toilet Transfer Equipment: Regular height toilet;Grab bars Toileting - Clothing Manipulation and Hygiene: Moderate assistance Where Assessed - Toileting Clothing Manipulation and Hygiene: Standing ADL Comments: Pt and family educated on sling donning/doffing, positioning and dressing/bathing techniques    OT Diagnosis: Acute pain;Generalized weakness  OT Problem List: Decreased strength;Increased edema;Decreased knowledge of use of DME or  AE;Decreased range of motion;Decreased coordination;Decreased knowledge of precautions;Decreased activity tolerance;Decreased cognition;Pain;Impaired balance (sitting and/or standing);Decreased safety awareness;Impaired UE functional use OT Treatment Interventions:     OT Goals    Visit Information  Last OT Received On: 03/30/13 Assistance Needed: +1    Subjective Data  Subjective: " Do you know when I get to go to the rehab place ?" Patient Stated Goal: To return home after rehab at SNF   Prior Functioning     Home Living Lives With: Spouse Available Help at Discharge: Family;Available 24 hours/day Type of Home: House Home Access: Stairs to enter [employer] of Steps: 3 Entrance Stairs-Rails: Can reach both Home Layout: One level Bathroom Shower/Tub: Forensic scientist: Standard Home Adaptive Equipment: Grab bars in shower;Straight cane;Walker - rolling;Walker - four wheeled Additional Comments: Over the last few weeks, decline in function per family Prior Function Level of Independence: Independent with assistive device(s) Able to Take Stairs?: Yes Driving: Yes Vocation: Retired Musician: No difficulties Dominant Hand: Right         Vision/Perception Vision - History Baseline Vision: Wears glasses only for reading Patient Visual Report: No change from baseline Perception Perception: Within Functional Limits   Cognition  Cognition Arousal/Alertness: Awake/alert Behavior During Therapy: WFL for tasks assessed/performed Overall Cognitive Status: History of cognitive impairments - at baseline    Extremity/Trunk Assessment Right Upper Extremity Assessment RUE ROM/Strength/Tone: Deficits RUE ROM/Strength/Tone Deficits: radial fx, cast from MP joints to above elbow, shoulder WNL RUE Sensation: WFL - Light Touch RUE Coordination: Deficits Left Upper Extremity Assessment LUE ROM/Strength/Tone: WFL for tasks  assessed LUE Sensation: WFL - Light Touch LUE Coordination: WFL - gross/fine motor     Mobility Bed Mobility Bed Mobility: Supine to Sit;Sitting - Scoot to Edge of Bed Supine to Sit:  4: Min guard;HOB flat Sitting - Scoot to Edge of Bed: 4: Min guard Details for Bed Mobility Assistance: Cues to reinforce NWBing RUE.  Transfers Sit to Stand: With upper extremity assist;4: Min assist;From chair/3-in-1;From toilet Stand to Sit: 4: Min assist;With upper extremity assist;With armrests;To chair/3-in-1;To toilet Details for Transfer Assistance: cues for use of LUE.       Balance Balance Balance Assessed: Yes Static Standing Balance Static Standing - Balance Support: Bilateral upper extremity supported;During functional activity Static Standing - Level of Assistance: 4: Min assist   End of Session OT - End of Session Equipment Utilized During Treatment: Gait belt Activity Tolerance: Patient tolerated treatment well Patient left: in chair;with call bell/phone within reach;with family/visitor present  GO     Galen Manila 03/30/2013, 2:42 PM

## 2013-03-30 NOTE — Clinical Social Work Note (Signed)
Clinical Social Worker met with patient and family at bedside to offer continued support and discuss patient plans at discharge.  Patient and family would prefer bed preference to [facility].  CSW has communicated with facility who will either extend a bed offer or deny first thing tomorrow morning.  Patient and family aware and hopeful for bed.  CSW to follow up with patient and family - patient family to provide transportation at discharge.  CSW remains available for support and to facilitate patient discharge needs once medically stable.  Macario Golds, [place] [phone]

## 2013-03-30 NOTE — Progress Notes (Signed)
Patient ID: Norm Parcel, female   DOB: 02/06/1923, 79 y.o.   MRN: [id number] Seen and agree with PA note from today.  Ambulating with therapies.  Will look for SNF placement for short-term rehab. Patient examined and I agree with the assessment and plan  Violeta Gelinas, MD, MPH, FACS Pager: [phone]  03/30/2013 10:37 AM
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
## 2013-03-31 DIAGNOSIS — I951 Orthostatic hypotension: Secondary | ICD-10-CM | POA: Diagnosis not present

## 2013-03-31 DIAGNOSIS — I509 Heart failure, unspecified: Secondary | ICD-10-CM | POA: Diagnosis not present

## 2013-03-31 DIAGNOSIS — D649 Anemia, unspecified: Secondary | ICD-10-CM | POA: Diagnosis not present

## 2013-03-31 DIAGNOSIS — R262 Difficulty in walking, not elsewhere classified: Secondary | ICD-10-CM | POA: Diagnosis not present

## 2013-03-31 DIAGNOSIS — M6281 Muscle weakness (generalized): Secondary | ICD-10-CM | POA: Diagnosis not present

## 2013-03-31 DIAGNOSIS — F329 Major depressive disorder, single episode, unspecified: Secondary | ICD-10-CM | POA: Diagnosis not present

## 2013-03-31 DIAGNOSIS — S069X9S Unspecified intracranial injury with loss of consciousness of unspecified duration, sequela: Secondary | ICD-10-CM | POA: Diagnosis not present

## 2013-03-31 DIAGNOSIS — S5290XD Unspecified fracture of unspecified forearm, subsequent encounter for closed fracture with routine healing: Secondary | ICD-10-CM | POA: Diagnosis not present

## 2013-03-31 DIAGNOSIS — M4800 Spinal stenosis, site unspecified: Secondary | ICD-10-CM | POA: Diagnosis not present

## 2013-03-31 DIAGNOSIS — I4891 Unspecified atrial fibrillation: Secondary | ICD-10-CM | POA: Diagnosis not present

## 2013-03-31 DIAGNOSIS — R002 Palpitations: Secondary | ICD-10-CM | POA: Diagnosis not present

## 2013-03-31 DIAGNOSIS — M545 Low back pain: Secondary | ICD-10-CM | POA: Diagnosis not present

## 2013-03-31 DIAGNOSIS — Z9181 History of falling: Secondary | ICD-10-CM | POA: Diagnosis not present

## 2013-03-31 DIAGNOSIS — Z5189 Encounter for other specified aftercare: Secondary | ICD-10-CM | POA: Diagnosis not present

## 2013-03-31 DIAGNOSIS — R279 Unspecified lack of coordination: Secondary | ICD-10-CM | POA: Diagnosis not present

## 2013-03-31 DIAGNOSIS — M1990 Unspecified osteoarthritis, unspecified site: Secondary | ICD-10-CM | POA: Diagnosis not present

## 2013-03-31 DIAGNOSIS — S62109A Fracture of unspecified carpal bone, unspecified wrist, initial encounter for closed fracture: Secondary | ICD-10-CM | POA: Diagnosis not present

## 2013-03-31 DIAGNOSIS — Z8739 Personal history of other diseases of the musculoskeletal system and connective tissue: Secondary | ICD-10-CM | POA: Diagnosis not present

## 2013-03-31 DIAGNOSIS — S52599A Other fractures of lower end of unspecified radius, initial encounter for closed fracture: Secondary | ICD-10-CM | POA: Diagnosis not present

## 2013-03-31 DIAGNOSIS — D509 Iron deficiency anemia, unspecified: Secondary | ICD-10-CM | POA: Diagnosis not present

## 2013-03-31 DIAGNOSIS — F430 Acute stress reaction: Secondary | ICD-10-CM | POA: Diagnosis not present

## 2013-03-31 DIAGNOSIS — S065X9A Traumatic subdural hemorrhage with loss of consciousness of unspecified duration, initial encounter: Secondary | ICD-10-CM | POA: Diagnosis not present

## 2013-03-31 NOTE — Progress Notes (Signed)
After several attempts to call to give report to [facility] I was able to talk with supervisor at [phone] and gave report. Pt being transported via private vehicle by family. Pt ready for discharge.

## 2013-03-31 NOTE — Progress Notes (Addendum)
Agree with above. Plan for snf today

## 2013-03-31 NOTE — Progress Notes (Signed)
Subjective: Patient reports "I think I'm doing good"  Objective: Vital signs in last 24 hours: Temp:  [97.4 F (36.3 C)-98.6 F (37 C)] 97.6 F (36.4 C) (06/18 0534) Pulse Rate:  [54-84] 82 (06/18 0534) Resp:  [18-20] 18 (06/18 0534) BP: (130-149)/(58-86) 138/82 mmHg (06/18 0534) SpO2:  [97 %-99 %] 98 % (06/18 0534)  Intake/Output from previous day: 06/17 0701 - 06/18 0700 In: 420 [P.O.:420] Out: 100 [Urine:100] Intake/Output this shift:    Alert, eating breakfast. Husband and son present. Pt pleased with progress, as are family members.   Lab Results: No results found for this basename: WBC, HGB, HCT, PLT,  in the last 72 hours BMET No results found for this basename: NA, K, CL, CO2, GLUCOSE, BUN, CREATININE, CALCIUM,  in the last 72 hours  Studies/Results: No results found.  Assessment/Plan: Improving   LOS: 5 days  SNF planned; f/u with Dr. Venetia Maxon in office in about a month. No repeat scan/studies indicated.    Georgiann Cocker 03/31/2013, 8:49 AM

## 2013-03-31 NOTE — Discharge Summary (Signed)
Agree with above 

## 2013-03-31 NOTE — Discharge Summary (Signed)
Physician Discharge Summary  Patient ID: Diane Mejia MRN: [id number] DOB/AGE: 77/04/1923 77 y.o.  Admit date: 03/26/2013 Discharge date: 03/31/2013  Discharge Diagnoses Patient Active Problem List   Diagnosis Date Noted  . Fall 03/30/2013  . Traumatic subdural hematoma 03/30/2013  . Right wrist fracture 03/30/2013  . Herpes zoster ophthalmicus 11/19/2012  . Polymyalgia rheumatica 12/08/2011  . Weakness 11/30/2011  . GERD (gastroesophageal reflux disease) 11/29/2011  . Hypertension 11/29/2011  . Sinus arrhythmia 11/29/2011  . Iron deficiency anemia 11/29/2011  . Anxiety and depression 11/29/2011  . Atrial fibrillation 11/29/2011    Consultants Dr. Toni Arthurs for orthopedic surgery  Dr. Maeola Harman for neurosurgery   Procedures None   HPI: Diane Mejia has had several falls. She attributes this to her spinal stenosis and using a walker. She fell again the day of admission at home. She had a questionable loss of consciousness. She was brought to the [facility]. Her workup showed a small subdural hematoma and a right wrist fracture. Her Plavix and aspirin were stopped and she was transferred to [facility] for admission by the trauma service and neurosurgical consultation.   Hospital Course: Neurosurgery recommended non-operative management considering the GCS of 15. Her wrist was splinted and orthopedic surgery recommended follow-up with the hand surgeon as an outpatient. A repeat head CT the next day was unchanged. Her neurologic status remained unchanged during her hospitalization. She did not suffer any exacerbation of her medical problems. She was mobilized with physical and occupational therapies who recommended skilled nursing facility placement versus home with 24-hour assistance. Though the patient and her husband wished for her to return home the son and daughter were adamant that she go to a facility for rehabilitation. She bowed to their wishes and she was discharged there  in stable condition.      Medication List    STOP taking these medications       aspirin EC 81 MG tablet     clopidogrel 75 MG tablet  Commonly known as:  PLAVIX      TAKE these medications       cholecalciferol 400 UNITS Tabs  Commonly known as:  VITAMIN D  Take 400 Units by mouth daily.     digoxin 0.125 MG tablet  Commonly known as:  LANOXIN  Take 0.125 mg by mouth daily.     diltiazem 180 MG 24 hr tablet  Commonly known as:  CARDIZEM LA  Take 180 mg by mouth daily.     DULoxetine 30 MG capsule  Commonly known as:  CYMBALTA  Take 30 mg by mouth every evening.     ferrous fumarate 325 (106 FE) MG Tabs  Commonly known as:  HEMOCYTE - 106 mg FE  Take 1 tablet by mouth every morning.     multivitamin tablet  Take 1 tablet by mouth every morning.     promethazine 12.5 MG tablet  Commonly known as:  PHENERGAN  Take 1 tablet (12.5 mg total) by mouth every 8 (eight) hours as needed for nausea.     traMADol 50 MG tablet  Commonly known as:  ULTRAM  Take 1 tablet (50 mg total) by mouth every 8 (eight) hours as needed for pain.             Follow-up Information   Follow up with Sharma Covert, MD. Schedule an appointment as soon as possible for a visit in 10 days.   Contact information:   [address] [place] [address] [phone]       Schedule an appointment as soon as possible for a visit with Dorian Heckle, MD.   Contact information:   [address] [place] [address] [phone]       Schedule an appointment as soon as possible for a visit with Elvina Sidle, MD.   Contact information:   [address] [place] [address] [phone]       Call [facility]. (As needed)    Contact information:   [address] [place] [address] [phone]       Signed: Freeman Caldron, PA-C Pager: [phone] General Trauma PA  Pager: [phone]  03/31/2013, 10:10 AM

## 2013-03-31 NOTE — Clinical Social Work Note (Signed)
Clinical Social Worker facilitated patient discharge including contacting patient family and facility to confirm patient discharge plans.  Clinical information faxed to facility and family agreeable with plan.  CSW arranged transport via family to [facility].  RN to call report prior to discharge.  Clinical Social Worker will sign off for now as social work intervention is no longer needed. Please consult us again if new need arises.  Jesse Morry Veiga, LCSW [phone] 

## 2013-03-31 NOTE — Progress Notes (Signed)
Patient ID: Diane Mejia, female   DOB: July 07, 1923, 79 y.o.   MRN: [id number]   LOS: 5 days   Subjective: No new c/o.   Objective: Vital signs in last 24 hours: Temp:  [97.4 F (36.3 C)-98.6 F (37 C)] 97.6 F (36.4 C) (06/18 0534) Pulse Rate:  [54-84] 82 (06/18 0534) Resp:  [18-20] 18 (06/18 0534) BP: (130-149)/(58-86) 138/82 mmHg (06/18 0534) SpO2:  [97 %-99 %] 98 % (06/18 0534) Last BM Date: 03/29/13   Physical Exam General appearance: alert and no distress Resp: clear to auscultation bilaterally Cardio: irregularly irregular rhythm GI: normal findings: bowel sounds normal and soft, non-tender Extremities: NVI   Assessment/Plan: Fall  SDH - TBI team  R wrist FX - F/U as outpatient with Dr. Orlan Leavens  A fib - digoxin home dose  FEN - tolerating diet  VTE - SCD's  Dispo -- Wife/husband would like to return home but son adamantly wants at least [facility] SN[facility]. Pt agrees to being faxed out and will make decision on home vs SNF once offers are known.    Freeman Caldron, PA-C Pager: [phone] General Trauma PA Pager: [phone]   03/31/2013

## 2013-04-01 ENCOUNTER — Non-Acute Institutional Stay (SKILLED_NURSING_FACILITY): Payer: Medicare Other | Admitting: Internal Medicine

## 2013-04-01 DIAGNOSIS — S069X9S Unspecified intracranial injury with loss of consciousness of unspecified duration, sequela: Secondary | ICD-10-CM

## 2013-04-01 DIAGNOSIS — D509 Iron deficiency anemia, unspecified: Secondary | ICD-10-CM

## 2013-04-01 DIAGNOSIS — I4891 Unspecified atrial fibrillation: Secondary | ICD-10-CM | POA: Diagnosis not present

## 2013-04-01 DIAGNOSIS — S065X9S Traumatic subdural hemorrhage with loss of consciousness of unspecified duration, sequela: Secondary | ICD-10-CM

## 2013-04-01 DIAGNOSIS — F329 Major depressive disorder, single episode, unspecified: Secondary | ICD-10-CM | POA: Diagnosis not present

## 2013-04-07 ENCOUNTER — Other Ambulatory Visit: Payer: Self-pay | Admitting: Neurosurgery

## 2013-04-07 DIAGNOSIS — I6200 Nontraumatic subdural hemorrhage, unspecified: Secondary | ICD-10-CM

## 2013-04-08 ENCOUNTER — Non-Acute Institutional Stay (SKILLED_NURSING_FACILITY): Payer: Medicare Other | Admitting: Adult Health

## 2013-04-08 DIAGNOSIS — I4891 Unspecified atrial fibrillation: Secondary | ICD-10-CM | POA: Diagnosis not present

## 2013-04-08 DIAGNOSIS — I951 Orthostatic hypotension: Secondary | ICD-10-CM

## 2013-04-08 DIAGNOSIS — F329 Major depressive disorder, single episode, unspecified: Secondary | ICD-10-CM | POA: Diagnosis not present

## 2013-04-08 DIAGNOSIS — Z5189 Encounter for other specified aftercare: Secondary | ICD-10-CM | POA: Diagnosis not present

## 2013-04-08 DIAGNOSIS — S065X9D Traumatic subdural hemorrhage with loss of consciousness of unspecified duration, subsequent encounter: Secondary | ICD-10-CM

## 2013-04-13 DIAGNOSIS — S52599A Other fractures of lower end of unspecified radius, initial encounter for closed fracture: Secondary | ICD-10-CM | POA: Diagnosis not present

## 2013-04-19 DIAGNOSIS — F430 Acute stress reaction: Secondary | ICD-10-CM | POA: Diagnosis not present

## 2013-04-19 DIAGNOSIS — I4891 Unspecified atrial fibrillation: Secondary | ICD-10-CM | POA: Diagnosis not present

## 2013-04-19 DIAGNOSIS — R002 Palpitations: Secondary | ICD-10-CM | POA: Diagnosis not present

## 2013-04-19 DIAGNOSIS — I509 Heart failure, unspecified: Secondary | ICD-10-CM | POA: Diagnosis not present

## 2013-04-19 DIAGNOSIS — I951 Orthostatic hypotension: Secondary | ICD-10-CM | POA: Diagnosis not present

## 2013-04-21 ENCOUNTER — Non-Acute Institutional Stay (SKILLED_NURSING_FACILITY): Payer: Medicare Other | Admitting: Adult Health

## 2013-04-21 DIAGNOSIS — S62101D Fracture of unspecified carpal bone, right wrist, subsequent encounter for fracture with routine healing: Secondary | ICD-10-CM

## 2013-04-21 DIAGNOSIS — D509 Iron deficiency anemia, unspecified: Secondary | ICD-10-CM

## 2013-04-21 DIAGNOSIS — I951 Orthostatic hypotension: Secondary | ICD-10-CM

## 2013-04-21 DIAGNOSIS — S065X9D Traumatic subdural hemorrhage with loss of consciousness of unspecified duration, subsequent encounter: Secondary | ICD-10-CM

## 2013-04-21 DIAGNOSIS — I10 Essential (primary) hypertension: Secondary | ICD-10-CM

## 2013-04-21 DIAGNOSIS — F329 Major depressive disorder, single episode, unspecified: Secondary | ICD-10-CM

## 2013-04-21 DIAGNOSIS — I4891 Unspecified atrial fibrillation: Secondary | ICD-10-CM

## 2013-04-21 DIAGNOSIS — Z5189 Encounter for other specified aftercare: Secondary | ICD-10-CM | POA: Diagnosis not present

## 2013-04-21 DIAGNOSIS — IMO0001 Reserved for inherently not codable concepts without codable children: Secondary | ICD-10-CM

## 2013-04-27 DIAGNOSIS — F329 Major depressive disorder, single episode, unspecified: Secondary | ICD-10-CM | POA: Insufficient documentation

## 2013-04-27 DIAGNOSIS — F3289 Other specified depressive episodes: Secondary | ICD-10-CM | POA: Insufficient documentation
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
## 2013-05-06 ENCOUNTER — Other Ambulatory Visit: Payer: Medicare Other

## 2013-05-07 ENCOUNTER — Ambulatory Visit (INDEPENDENT_AMBULATORY_CARE_PROVIDER_SITE_OTHER): Payer: Medicare Other | Admitting: Emergency Medicine

## 2013-05-07 VITALS — BP 100/60 | HR 97 | Temp 97.8°F | Resp 17 | Ht 63.0 in | Wt 161.0 lb

## 2013-05-07 DIAGNOSIS — R110 Nausea: Secondary | ICD-10-CM

## 2013-05-07 DIAGNOSIS — F329 Major depressive disorder, single episode, unspecified: Secondary | ICD-10-CM | POA: Diagnosis not present

## 2013-05-07 DIAGNOSIS — I6200 Nontraumatic subdural hemorrhage, unspecified: Secondary | ICD-10-CM | POA: Diagnosis not present

## 2013-05-07 DIAGNOSIS — G894 Chronic pain syndrome: Secondary | ICD-10-CM | POA: Diagnosis not present

## 2013-05-07 DIAGNOSIS — S065X9A Traumatic subdural hemorrhage with loss of consciousness of unspecified duration, initial encounter: Secondary | ICD-10-CM

## 2013-05-07 MED ORDER — PAROXETINE HCL 20 MG PO TABS: 20.0000 mg | ORAL_TABLET | ORAL | Status: DC

## 2013-05-07 MED ORDER — TRAMADOL HCL 50 MG PO TABS: 50.0000 mg | ORAL_TABLET | Freq: Three times a day (TID) | ORAL | Status: DC | PRN

## 2013-05-07 MED ORDER — PROMETHAZINE HCL 12.5 MG PO TABS: 12.5000 mg | ORAL_TABLET | Freq: Three times a day (TID) | ORAL | Status: DC | PRN

## 2013-05-07 NOTE — Patient Instructions (Addendum)
Serotonin Syndrome  Serotonin is a brain chemical that regulates the nervous system. Some kinds of drugs increase the amount of serotonin in your body. Drugs that increase the serotonin in your body include:   · Anti-depressant medications.  · St. John's wort.  · Recreational drugs.  · Migraine medicines.  · Some pain medicines.  SYMPTOMS  Combining these drugs increases the risk that you will become ill with a toxic condition called serotonin syndrome.   Symptoms of too much serotonin include:  · Confusion.  · Agitation.  · Weakness.  · Insomnia.  · Fever.  · Sweats.  Other symptoms that may develop include:  · Shakiness.  · Muscle spasms.  · Seizures.  TREATMENT  · Hospital treatment is often needed until the effects are controlled.  · Avoiding the combination of medicines listed above is recommended.  · Check with your doctor if you are concerned about your medicine or the side effects.  Document Released: 11/07/2004 Document Revised: 12/23/2011 Document Reviewed: 09/30/2005  ExitCare® Patient Information ©2014 [employer].
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
## 2013-05-08 ENCOUNTER — Encounter: Payer: Self-pay | Admitting: Adult Health

## 2013-05-08 DIAGNOSIS — I951 Orthostatic hypotension: Secondary | ICD-10-CM | POA: Insufficient documentation

## 2013-05-08 NOTE — Progress Notes (Signed)
  Subjective:    Patient ID: Diane Mejia, female    DOB: October 20, 1922, 79 y.o.   MRN: [id number]  HPI This is an 79 year old female who  Was noted to have BPs as follows:  Sitting 115/62, 123/68; standing 87/51, 110/71. She has been admitted to [facility] on 03/31/13 from [facility]. She had several falls at home and was brought to the hospital due to questionable loss of consciousness. She was then diagnosed with traumatic subdural hematoma and fractured right wrist. She has been admitted for a short-term rehabilitation.    Review of Systems  Constitutional: Negative.   Respiratory: Negative for cough and shortness of breath.   Cardiovascular: Negative for chest pain.  Gastrointestinal: Negative for abdominal pain and abdominal distention.  Genitourinary: Negative.   Neurological: Negative.   Psychiatric/Behavioral: Negative.        Objective:   Physical Exam  Nursing note and vitals reviewed. Constitutional: She is oriented to person, place, and time. She appears well-developed and well-nourished.  HENT:  Head: Normocephalic and atraumatic.  Right Ear: External ear normal.  Left Ear: External ear normal.  Nose: Nose normal.  Mouth/Throat: Oropharynx is clear and moist.  Cardiovascular: Normal heart sounds.   Irregularly irregular  Pulmonary/Chest: Effort normal and breath sounds normal.  Abdominal: Soft. Bowel sounds are normal.  Musculoskeletal: Normal range of motion. She exhibits no edema and no tenderness.  Neurological: She is alert and oriented to person, place, and time.  Skin: Skin is warm and dry.  Psychiatric: She has a normal mood and affect. Her behavior is normal. Judgment and thought content normal.    LABS/PROCEDURES: 03/27/13 WBC 2.5 hemoglobin 10.7 hematocrit 34.8 sodium 138 potassium 4.1 glucose 105 BUN 26 creatinine 1.15 calcium 8.5 CT head without contrast shows no significant change in the distribution or a mile of subdural blood. There is now a  small focals of blood in the left lateral ventricle. Evidence for chronic small vessel ischemic changes. 03/26/13 x-ray of right least shows possible nondisplaced fracture distal radius CT head without contrast shows right frontal temporal subdural hematoma. CT cervical spine shows cervical levoscoliosis. Mild spinal stenosis at L4-5 and L5-S1 do to spurring     Medications reviewed.    Assessment & Plan:   Orthostatic hypotension - decrease Cardizem LA to 120 mg by mouth daily; continue orthostatic BP/heart rate monitoring every shift x1 week  Dramatic subdural hematoma - followup consult with Dr. Jomarie Longs Stern-neurosurgery

## 2013-05-08 NOTE — Progress Notes (Signed)
  Subjective:    Patient ID: Diane Mejia, female    DOB: 1923-03-30, 79 y.o.   MRN: [id number]  HPI  This is an 79 year old female who is for discharge home and will be followed up by home health PT, OT and nursing. She has been admitted to [facility] on 03/31/13 from [facility]. She had several falls at home and was brought to the hospital due to questionable loss of consciousness. She was then diagnosed with traumatic subdural hematoma and fractured right wrist. She has completed SNF rehabilitation and therapy has cleared the patient for discharge.  Review of Systems  Constitutional: Negative.   HENT: Negative.   Eyes: Negative.   Respiratory: Negative for cough and shortness of breath.   Cardiovascular: Negative for chest pain.  Gastrointestinal: Negative for abdominal pain and abdominal distention.  Genitourinary: Negative.   Skin: Negative.   Neurological: Negative.   Hematological: Negative for adenopathy. Does not bruise/bleed easily.  Psychiatric/Behavioral: Negative.        Objective:   Physical Exam  Nursing note and vitals reviewed. Constitutional: She is oriented to person, place, and time. She appears well-developed and well-nourished.  HENT:  Head: Normocephalic and atraumatic.  Right Ear: External ear normal.  Left Ear: External ear normal.  Nose: Nose normal.  Mouth/Throat: Oropharynx is clear and moist.  Eyes: Conjunctivae and EOM are normal. Pupils are equal, round, and reactive to light.  Neck: Normal range of motion.  Cardiovascular: Normal rate and intact distal pulses.   Irregularly irregular  Pulmonary/Chest: Effort normal and breath sounds normal.  Abdominal: Soft. Bowel sounds are normal.  Musculoskeletal: Normal range of motion. She exhibits no edema and no tenderness.  Neurological: She is alert and oriented to person, place, and time.  Skin: Skin is warm and dry.  Psychiatric: She has a normal mood and affect. Her behavior is normal.  Judgment and thought content normal.    LABS/PROCEDURES: 03/27/13 WBC 2.5 hemoglobin 10.7 hematocrit 34.8 sodium 138 potassium 4.1 glucose 105 BUN 26 creatinine 1.15 calcium 8.5 CT head without contrast shows no significant change in the distribution or a mile of subdural blood. There is now a small focals of blood in the left lateral ventricle. Evidence for chronic small vessel ischemic changes. 03/26/13 x-ray of right least shows possible nondisplaced fracture distal radius CT head without contrast shows right frontal temporal subdural hematoma. CT cervical spine shows cervical levoscoliosis. Mild spinal stenosis at L4-5 and L5-S1 do to spurring     Medications reviewed.    Assessment & Plan:   Orthostatic hypotension - stable  Depressive disorder, not elsewhere classified - stable  Traumatic subdural hematoma - follow-up with neurosurgeon  Right wrist fracture - followup with orthopedic  Hypertension - well controlled  Iron deficiency anemia - stable  Atrial fibrillation - rate controlled   Total discharge time: <30 minutes Discharge time involved coordination of the discharge process with social worker, nursing staff and therapy department. Medical justification for home health services to verify.   CPT CODE:  78295

## 2013-05-13 ENCOUNTER — Ambulatory Visit
Admission: RE | Admit: 2013-05-13 | Discharge: 2013-05-13 | Disposition: A | Discharge status: Home / Self Care | Payer: Medicare Other | Source: Ambulatory Visit | Attending: Neurosurgery | Admitting: Neurosurgery

## 2013-05-13 DIAGNOSIS — R6889 Other general symptoms and signs: Secondary | ICD-10-CM | POA: Diagnosis not present

## 2013-05-13 DIAGNOSIS — I6200 Nontraumatic subdural hemorrhage, unspecified: Secondary | ICD-10-CM

## 2013-05-13 DIAGNOSIS — S52599A Other fractures of lower end of unspecified radius, initial encounter for closed fracture: Secondary | ICD-10-CM | POA: Diagnosis not present

## 2013-06-04 DIAGNOSIS — I6200 Nontraumatic subdural hemorrhage, unspecified: Secondary | ICD-10-CM | POA: Diagnosis not present

## 2013-06-04 DIAGNOSIS — R55 Syncope and collapse: Secondary | ICD-10-CM | POA: Diagnosis not present

## 2013-06-07 ENCOUNTER — Ambulatory Visit (INDEPENDENT_AMBULATORY_CARE_PROVIDER_SITE_OTHER): Payer: Medicare Other | Admitting: Family Medicine

## 2013-06-07 VITALS — BP 132/60 | HR 90 | Temp 97.8°F | Resp 17 | Ht 62.5 in | Wt 160.0 lb

## 2013-06-07 DIAGNOSIS — I4891 Unspecified atrial fibrillation: Secondary | ICD-10-CM | POA: Diagnosis not present

## 2013-06-07 DIAGNOSIS — Z0000 Encounter for general adult medical examination without abnormal findings: Secondary | ICD-10-CM

## 2013-06-07 DIAGNOSIS — R0989 Other specified symptoms and signs involving the circulatory and respiratory systems: Secondary | ICD-10-CM | POA: Diagnosis not present

## 2013-06-07 DIAGNOSIS — R5381 Other malaise: Secondary | ICD-10-CM | POA: Diagnosis not present

## 2013-06-07 DIAGNOSIS — R5383 Other fatigue: Secondary | ICD-10-CM

## 2013-06-07 LAB — POCT CBC
Granulocyte percent: 67.1 % (ref 37–80)
HCT, POC: 37.7 % (ref 37.7–47.9)
Hemoglobin: 11.2 g/dL — AB (ref 12.2–16.2)
Lymph, poc: 2.6 (ref 0.6–3.4)
MCH, POC: 25.5 pg — AB (ref 27–31.2)
MCHC: 29.7 g/dL — AB (ref 31.8–35.4)
MCV: 85.6 fL (ref 80–97)
MID (cbc): 0.7 (ref 0–0.9)
MPV: 10 fL (ref 0–99.8)
POC Granulocyte: 6.9 (ref 2–6.9)
POC LYMPH PERCENT: 25.7 % (ref 10–50)
POC MID %: 7.2 % (ref 0–12)
Platelet Count, POC: 438 10*3/uL — AB (ref 142–424)
RBC: 4.4 M/uL (ref 4.04–5.48)
RDW, POC: 19.2 %
WBC: 10.3 10*3/uL — AB (ref 4.6–10.2)

## 2013-06-07 NOTE — Addendum Note (Signed)
Addended by: Elease Etienne A on: 06/07/2013 07:01 PM   Modules accepted: Orders

## 2013-06-07 NOTE — Progress Notes (Signed)
  Subjective:    Patient ID: Diane Mejia, female    DOB: [birth year], 79 y.o.   MRN: [id number]  HPI  79 YO female patient here with complaints she is weak. She feels like she is going to pass out. She feels like she has no strength and she feels like she could go to bed at any time. She has not any energy. She states that she has not had the same energy she used to for about a year. No longer on the digoxin.  She would like to have her blood work done to make sure her sugar is within limits. She states she a prescription for her shingles shot. She states she still has itching fits.    Review of Systems Orthostatic.    Objective:   Physical Exam Nad Alert, no distress HEENT:  Unremarkable, hard of hearing. Chest:  Clear Heart:  Irreg, irreg Extrem:  No pedal edema, lower legs are doughy with soft tissue swelling c/w adipose tissue Skin:  Few ecchymoses on arms. EKG:  Tachycardia, atrial fibrillation     Assessment & Plan:  Patient needs to go back to [facility] cardiologist.  Signed,  Elvina Sidle, MD

## 2013-06-08 LAB — COMPREHENSIVE METABOLIC PANEL
ALT: <8 U/L (ref 0–35)
AST: 11 U/L (ref 0–37)
Albumin: 4.1 g/dL (ref 3.5–5.2)
Alkaline Phosphatase: 81 U/L (ref 39–117)
BUN: 18 mg/dL (ref 6–23)
CO2: 26 mEq/L (ref 19–32)
Calcium: 9.6 mg/dL (ref 8.4–10.5)
Chloride: 99 mEq/L (ref 96–112)
Creat: 1.17 mg/dL — ABNORMAL HIGH (ref 0.50–1.10)
Glucose, Bld: 100 mg/dL — ABNORMAL HIGH (ref 70–99)
Potassium: 4.5 mEq/L (ref 3.5–5.3)
Sodium: 135 mEq/L (ref 135–145)
Total Bilirubin: 0.3 mg/dL (ref 0.3–1.2)
Total Protein: 6.9 g/dL (ref 6.0–8.3)

## 2013-06-10 ENCOUNTER — Other Ambulatory Visit: Payer: Medicare Other

## 2013-08-03 ENCOUNTER — Ambulatory Visit (INDEPENDENT_AMBULATORY_CARE_PROVIDER_SITE_OTHER): Payer: Medicare Other | Admitting: Radiology

## 2013-08-03 DIAGNOSIS — Z23 Encounter for immunization: Secondary | ICD-10-CM | POA: Diagnosis not present

## 2013-08-04 ENCOUNTER — Encounter (HOSPITAL_COMMUNITY): Payer: Self-pay | Admitting: Emergency Medicine

## 2013-08-04 ENCOUNTER — Emergency Department (HOSPITAL_COMMUNITY): Payer: Medicare Other

## 2013-08-04 ENCOUNTER — Ambulatory Visit (INDEPENDENT_AMBULATORY_CARE_PROVIDER_SITE_OTHER): Payer: Medicare Other | Admitting: Family Medicine

## 2013-08-04 ENCOUNTER — Encounter: Payer: Self-pay | Admitting: Family Medicine

## 2013-08-04 ENCOUNTER — Inpatient Hospital Stay (HOSPITAL_COMMUNITY)
Admission: EM | Admit: 2013-08-04 | Discharge: 2013-08-06 | DRG: 293 | Disposition: A | Discharge status: Home / Self Care | Payer: Medicare Other | Attending: Family Medicine | Admitting: Family Medicine

## 2013-08-04 VITALS — BP 100/50 | HR 108

## 2013-08-04 DIAGNOSIS — F329 Major depressive disorder, single episode, unspecified: Secondary | ICD-10-CM | POA: Diagnosis present

## 2013-08-04 DIAGNOSIS — S065X9S Traumatic subdural hemorrhage with loss of consciousness of unspecified duration, sequela: Secondary | ICD-10-CM

## 2013-08-04 DIAGNOSIS — K219 Gastro-esophageal reflux disease without esophagitis: Secondary | ICD-10-CM

## 2013-08-04 DIAGNOSIS — Z96659 Presence of unspecified artificial knee joint: Secondary | ICD-10-CM

## 2013-08-04 DIAGNOSIS — F3289 Other specified depressive episodes: Secondary | ICD-10-CM

## 2013-08-04 DIAGNOSIS — Z7982 Long term (current) use of aspirin: Secondary | ICD-10-CM | POA: Diagnosis not present

## 2013-08-04 DIAGNOSIS — M353 Polymyalgia rheumatica: Secondary | ICD-10-CM

## 2013-08-04 DIAGNOSIS — D649 Anemia, unspecified: Secondary | ICD-10-CM | POA: Diagnosis not present

## 2013-08-04 DIAGNOSIS — I4891 Unspecified atrial fibrillation: Secondary | ICD-10-CM | POA: Diagnosis present

## 2013-08-04 DIAGNOSIS — R5381 Other malaise: Secondary | ICD-10-CM | POA: Diagnosis present

## 2013-08-04 DIAGNOSIS — R0602 Shortness of breath: Secondary | ICD-10-CM

## 2013-08-04 DIAGNOSIS — Z7902 Long term (current) use of antithrombotics/antiplatelets: Secondary | ICD-10-CM

## 2013-08-04 DIAGNOSIS — J8410 Pulmonary fibrosis, unspecified: Secondary | ICD-10-CM | POA: Diagnosis present

## 2013-08-04 DIAGNOSIS — I509 Heart failure, unspecified: Principal | ICD-10-CM

## 2013-08-04 DIAGNOSIS — D509 Iron deficiency anemia, unspecified: Secondary | ICD-10-CM

## 2013-08-04 DIAGNOSIS — J90 Pleural effusion, not elsewhere classified: Secondary | ICD-10-CM | POA: Diagnosis not present

## 2013-08-04 DIAGNOSIS — R079 Chest pain, unspecified: Secondary | ICD-10-CM | POA: Diagnosis not present

## 2013-08-04 DIAGNOSIS — J811 Chronic pulmonary edema: Secondary | ICD-10-CM | POA: Diagnosis not present

## 2013-08-04 DIAGNOSIS — I10 Essential (primary) hypertension: Secondary | ICD-10-CM | POA: Diagnosis not present

## 2013-08-04 DIAGNOSIS — I951 Orthostatic hypotension: Secondary | ICD-10-CM

## 2013-08-04 DIAGNOSIS — S065XAS Traumatic subdural hemorrhage with loss of consciousness status unknown, sequela: Secondary | ICD-10-CM

## 2013-08-04 DIAGNOSIS — F341 Dysthymic disorder: Secondary | ICD-10-CM | POA: Diagnosis not present

## 2013-08-04 DIAGNOSIS — F32A Depression, unspecified: Secondary | ICD-10-CM

## 2013-08-04 LAB — BASIC METABOLIC PANEL
CO2: 26 mEq/L (ref 19–32)
GFR calc non Af Amer: 55 mL/min — ABNORMAL LOW (ref >90)
Glucose, Bld: 100 mg/dL — ABNORMAL HIGH (ref 70–99)
Potassium: 4.2 mEq/L (ref 3.5–5.1)
Sodium: 141 mEq/L (ref 135–145)

## 2013-08-04 LAB — CBC WITH DIFFERENTIAL/PLATELET
Basophils Absolute: 0.1 10*3/uL (ref 0.0–0.1)
Eosinophils Absolute: 0.4 10*3/uL (ref 0.0–0.7)
Lymphocytes Relative: 17 % (ref 12–46)
Lymphs Abs: 1.2 10*3/uL (ref 0.7–4.0)
MCH: 24 pg — ABNORMAL LOW (ref 26.0–34.0)
Neutro Abs: 4.9 10*3/uL (ref 1.7–7.7)
Neutrophils Relative %: 68 % (ref 43–77)
Platelets: 374 10*3/uL (ref 150–400)
RBC: 3.38 MIL/uL — ABNORMAL LOW (ref 3.87–5.11)
RDW: 16.6 % — ABNORMAL HIGH (ref 11.5–15.5)
WBC: 7.2 10*3/uL (ref 4.0–10.5)

## 2013-08-04 LAB — RETICULOCYTES
RBC.: 3.4 MIL/uL — ABNORMAL LOW (ref 3.87–5.11)
Retic Count, Absolute: 85 10*3/uL (ref 19.0–186.0)
Retic Ct Pct: 2.5 % (ref 0.4–3.1)

## 2013-08-04 LAB — D-DIMER, QUANTITATIVE: D-Dimer, Quant: 1.82 ug/mL-FEU — ABNORMAL HIGH (ref 0.00–0.48)

## 2013-08-04 LAB — TROPONIN I: Troponin I: <0.3 ng/mL (ref <0.30)

## 2013-08-04 LAB — PRO B NATRIURETIC PEPTIDE: Pro B Natriuretic peptide (BNP): 3112 pg/mL — ABNORMAL HIGH (ref 0–450)

## 2013-08-04 LAB — OCCULT BLOOD, POC DEVICE: Fecal Occult Bld: NEGATIVE

## 2013-08-04 MED ORDER — IOHEXOL 350 MG/ML SOLN
80.0000 mL | Freq: Once | INTRAVENOUS | Status: AC | PRN
  Administered 2013-08-04: 63 mL via INTRAVENOUS
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
## 2013-08-04 NOTE — Progress Notes (Signed)
Patient ID: Diane Mejia, female   DOB: Apr 05, 1923, 79 y.o.   MRN: [id number]  @UMFCLOGO @  Patient ID: Diane Mejia MRN: [id number], DOB: 02-27-1923, 79 y.o. Date of Encounter: 08/04/2013, 2:11 PM This chart was scribed for Elvina Sidle, MD by Valera Castle, ED Scribe. This patient was seen in room 6 and the patient's care was started at 2:11 PM.  Primary Physician: Elvina Sidle, MD  Chief Complaint: SOB, weakness and dizziness  HPI: 79 y.o. year old female with history below presents with sudden, constant SOB, with associated low BP, onset last night. She reports she is SOB all the time, but that it is exacerbated when she is moving but not worse laying down. She is having trouble sleeping due to the trouble breathing. She states she fell last June, 04/2012, but that she has been doing well. She is not sure why the SOB started. She reports some swelling in her right, lower leg. She denies calf pain, thigh pain, abdominal pain, fever, headache, diarrhea, bowel problems, and any other associated symptoms.   Dr. Claris Gower - Cardiologist at [facility]  Past Medical History  Diagnosis Date  . Low back pain   . Afib   . Arthritis   . Depression   . Anemia   . Cataract   . Blood transfusion without reported diagnosis   . Heart murmur      Home Meds: Prior to Admission medications   Medication Sig Start Date End Date Taking? Authorizing Provider  cholecalciferol (VITAMIN D) 400 UNITS TABS Take 400 Units by mouth daily.    Historical Provider, MD  digoxin (LANOXIN) 0.125 MG tablet Take 0.125 mg by mouth daily.    Historical Provider, MD  diltiazem (CARDIZEM LA) 180 MG 24 hr tablet Take 180 mg by mouth daily.    Historical Provider, MD  ferrous fumarate (HEMOCYTE - 106 MG FE) 325 (106 FE) MG TABS Take 1 tablet by mouth every morning.     Historical Provider, MD  Multiple Vitamin (MULTIVITAMIN) tablet Take 1 tablet by mouth every morning.     Historical Provider, MD  PARoxetine (PAXIL) 20 MG  tablet Take 1 tablet (20 mg total) by mouth every morning. 05/07/13   Phillips Odor, MD  promethazine (PHENERGAN) 12.5 MG tablet Take 1 tablet (12.5 mg total) by mouth every 8 (eight) hours as needed for nausea. 05/07/13   Phillips Odor, MD  traMADol (ULTRAM) 50 MG tablet Take 1 tablet (50 mg total) by mouth every 8 (eight) hours as needed for pain. 05/07/13   Phillips Odor, MD    Allergies:  Allergies  Allergen Reactions  . Dilaudid [Hydromorphone Hcl] Itching  . Percocet [Oxycodone-Acetaminophen] Itching  . Sulfa Antibiotics Hives, Itching and Rash    History   Social History  . Marital Status: Married    Spouse Name: N/A    Number of Children: N/A  . Years of Education: N/A   Occupational History  . Not on file.   Social History Main Topics  . Smoking status: Never Smoker   . Smokeless tobacco: Never Used  . Alcohol Use: No  . Drug Use: No  . Sexual Activity: No   Other Topics Concern  . Not on file   Social History Narrative  . No narrative on file     Review of Systems: Constitutional: negative for chills, fever, night sweats, weight changes, or fatigue  HEENT: negative for vision changes, hearing loss, congestion, rhinorrhea, ST, epistaxis, or sinus pressure Cardiovascular: Positive for, palpitations Respiratory:  negative for hemoptysis, wheezing, or cough Positive for SOB Abdominal: negative for abdominal pain, nausea, vomiting, diarrhea, or constipation Dermatological: negative for rash Neurologic: negative for headache, dizziness, or syncope All other systems reviewed and are otherwise negative with the exception to those above and in the HPI.   Physical Exam: Blood pressure 100/50, pulse 108, SpO2 94.00%., There is no weight on file to calculate BMI. General: Well developed, well nourished, in no acute distress. Head: Normocephalic, atraumatic, eyes without discharge, sclera non-icteric, nares are without discharge. Bilateral auditory canals clear,  TM's are without perforation, pearly grey and translucent with reflective cone of light bilaterally. Oral cavity moist, posterior pharynx without exudate, erythema, peritonsillar abscess, or post nasal drip.  Neck: Supple. No thyromegaly. Full ROM. No lymphadenopathy. Lungs: Clear bilaterally to auscultation without wheezes, rales, or rhonchi. Breathing is unlabored. Heart: irreg, rapid rate with Normal S1 S2. No murmurs, rubs, or gallops appreciated. Abdomen: Soft, non-tender, non-distended with normoactive bowel sounds. No hepatomegaly. No rebound/guarding. No obvious abdominal masses. Msk:  Strength and tone normal for age. Extremities/Skin: Warm and dry. No clubbing or cyanosis. No edema. No rashes or suspicious lesions.  No edema although legs are enlarged with fat. Neuro: Alert and oriented X 3. Moves all extremities spontaneously. Gait is normal. CNII-XII grossly in tact. Psych:  Responds to questions appropriately with a normal affect.    ASSESSMENT AND PLAN:  79 y.o. year old female with chronic a. fib who comes in with abrupt onset of shortness of breath last night which has persisted today. She is lightheaded when she stands up.  Patient appears to be suffering from rapid rate atrial fibrillation. She needs further evaluation in the emergency room.   Signed, Elvina Sidle, MD 08/04/2013 2:11 PM

## 2013-08-04 NOTE — ED Notes (Signed)
Pt transferred from [facility] with a fib, SOB and chest pain. sts chest pain and SOB started last night. Hx of a fib. Pt lungs clear. sats 100% on RA. BP 105/75. HR irregular.

## 2013-08-05 ENCOUNTER — Encounter (HOSPITAL_COMMUNITY): Payer: Self-pay | Admitting: Internal Medicine

## 2013-08-05 DIAGNOSIS — I509 Heart failure, unspecified: Secondary | ICD-10-CM | POA: Diagnosis present

## 2013-08-05 LAB — COMPREHENSIVE METABOLIC PANEL
ALT: 5 U/L (ref 0–35)
Alkaline Phosphatase: 62 U/L (ref 39–117)
CO2: 26 mEq/L (ref 19–32)
Calcium: 8.8 mg/dL (ref 8.4–10.5)
Creatinine, Ser: 0.78 mg/dL (ref 0.50–1.10)
GFR calc Af Amer: 84 mL/min — ABNORMAL LOW (ref >90)
GFR calc non Af Amer: 73 mL/min — ABNORMAL LOW (ref >90)
Glucose, Bld: 98 mg/dL (ref 70–99)
Potassium: 3.6 mEq/L (ref 3.5–5.1)
Sodium: 138 mEq/L (ref 135–145)
Total Bilirubin: 0.5 mg/dL (ref 0.3–1.2)

## 2013-08-05 LAB — HEMOGLOBIN A1C
Hgb A1c MFr Bld: 5.8 % — ABNORMAL HIGH (ref <5.7)
Mean Plasma Glucose: 120 mg/dL — ABNORMAL HIGH (ref <117)

## 2013-08-05 LAB — CBC
Hemoglobin: 8.2 g/dL — ABNORMAL LOW (ref 12.0–15.0)
MCHC: 30.3 g/dL (ref 30.0–36.0)
Platelets: 395 10*3/uL (ref 150–400)
RDW: 16.4 % — ABNORMAL HIGH (ref 11.5–15.5)

## 2013-08-05 LAB — FOLATE: Folate: >20 ng/mL

## 2013-08-05 LAB — PREPARE RBC (CROSSMATCH)

## 2013-08-05 LAB — IRON AND TIBC
Saturation Ratios: 4 % — ABNORMAL LOW (ref 20–55)
TIBC: 363 ug/dL (ref 250–470)

## 2013-08-05 LAB — VITAMIN B12: Vitamin B-12: 573 pg/mL (ref 211–911)

## 2013-08-05 LAB — FERRITIN: Ferritin: 10 ng/mL (ref 10–291)

## 2013-08-05 MED ORDER — ACETAMINOPHEN 325 MG PO TABS: 650.0000 mg | ORAL_TABLET | ORAL | Status: DC | PRN

## 2013-08-05 MED ORDER — PAROXETINE HCL 20 MG PO TABS
20.0000 mg | ORAL_TABLET | Freq: Every evening | ORAL | Status: DC
Start: 2013-08-05 — End: 2013-08-06
  Administered 2013-08-05: 20 mg via ORAL
  Filled 2013-08-05 (×2): qty 1

## 2013-08-05 MED ORDER — CLOPIDOGREL BISULFATE 75 MG PO TABS
75.0000 mg | ORAL_TABLET | Freq: Every day | ORAL | Status: DC
  Administered 2013-08-05 – 2013-08-06 (×2): 75 mg via ORAL
  Filled 2013-08-05 (×2): qty 1

## 2013-08-05 MED ORDER — CHOLECALCIFEROL 10 MCG (400 UNIT) PO TABS
400.0000 [IU] | ORAL_TABLET | Freq: Every day | ORAL | Status: DC
  Administered 2013-08-05 – 2013-08-06 (×2): 400 [IU] via ORAL
  Filled 2013-08-05 (×2): qty 1

## 2013-08-05 MED ORDER — POLYETHYLENE GLYCOL 3350 17 G PO PACK
17.0000 g | PACK | Freq: Every day | ORAL | Status: DC
  Administered 2013-08-05 – 2013-08-06 (×2): 17 g via ORAL
  Filled 2013-08-05 (×2): qty 1

## 2013-08-05 MED ORDER — OXYCODONE-ACETAMINOPHEN 5-325 MG PO TABS
1.0000 | ORAL_TABLET | ORAL | Status: DC | PRN
  Administered 2013-08-05 (×2): 1 via ORAL
  Filled 2013-08-05 (×2): qty 1

## 2013-08-05 MED ORDER — FUROSEMIDE 10 MG/ML IJ SOLN
20.0000 mg | Freq: Two times a day (BID) | INTRAMUSCULAR | Status: DC
  Administered 2013-08-05 (×2): 20 mg via INTRAVENOUS
  Filled 2013-08-05 (×4): qty 2

## 2013-08-05 MED ORDER — FERROUS FUMARATE 325 (106 FE) MG PO TABS
1.0000 | ORAL_TABLET | Freq: Every morning | ORAL | Status: DC
  Administered 2013-08-05 – 2013-08-06 (×2): 106 mg via ORAL
  Filled 2013-08-05 (×2): qty 1

## 2013-08-05 MED ORDER — FUROSEMIDE 10 MG/ML IJ SOLN: 20.0000 mg | Freq: Once | INTRAMUSCULAR | Status: DC

## 2013-08-05 MED ORDER — ONDANSETRON HCL 4 MG/2ML IJ SOLN: 4.0000 mg | Freq: Four times a day (QID) | INTRAMUSCULAR | Status: DC | PRN

## 2013-08-05 MED ORDER — SODIUM CHLORIDE 0.9 % IJ SOLN
3.0000 mL | Freq: Two times a day (BID) | INTRAMUSCULAR | Status: DC
  Administered 2013-08-05 – 2013-08-06 (×4): 3 mL via INTRAVENOUS

## 2013-08-05 MED ORDER — SODIUM CHLORIDE 0.9 % IV SOLN: 250.0000 mL | INTRAVENOUS | Status: DC | PRN

## 2013-08-05 MED ORDER — SODIUM CHLORIDE 0.9 % IV SOLN
1020.0000 mg | Freq: Once | INTRAVENOUS | Status: AC
  Administered 2013-08-05: 1020 mg via INTRAVENOUS
  Filled 2013-08-05: qty 34

## 2013-08-05 MED ORDER — SODIUM CHLORIDE 0.9 % IJ SOLN: 3.0000 mL | INTRAMUSCULAR | Status: DC | PRN

## 2013-08-05 MED ORDER — LISINOPRIL 2.5 MG PO TABS
2.5000 mg | ORAL_TABLET | Freq: Every day | ORAL | Status: DC
  Administered 2013-08-06: 2.5 mg via ORAL
  Filled 2013-08-05 (×2): qty 1

## 2013-08-05 MED ORDER — ASPIRIN EC 81 MG PO TBEC
81.0000 mg | DELAYED_RELEASE_TABLET | Freq: Every day | ORAL | Status: DC
  Administered 2013-08-05: 22:00:00 81 mg via ORAL
  Filled 2013-08-05 (×2): qty 1

## 2013-08-05 MED ORDER — FUROSEMIDE 10 MG/ML IJ SOLN
20.0000 mg | Freq: Once | INTRAMUSCULAR | Status: AC
  Administered 2013-08-05: 20 mg via INTRAVENOUS

## 2013-08-05 MED ORDER — HEPARIN SODIUM (PORCINE) 5000 UNIT/ML IJ SOLN
5000.0000 [IU] | Freq: Three times a day (TID) | INTRAMUSCULAR | Status: DC
  Filled 2013-08-05: qty 1

## 2013-08-05 MED ORDER — DILTIAZEM HCL ER COATED BEADS 180 MG PO CP24
180.0000 mg | ORAL_CAPSULE | Freq: Every day | ORAL | Status: DC
  Administered 2013-08-05 (×2): 180 mg via ORAL
  Filled 2013-08-05 (×3): qty 1

## 2013-08-05 NOTE — Progress Notes (Signed)
  Echocardiogram 2D Echocardiogram has been performed.  Arvil Chaco 08/05/2013, 12:01 PM
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
## 2013-08-05 NOTE — H&P (Signed)
FMTS Attending Admission Note: Diane Levy MD [phone] pager office [phone] I  have seen and examined this patient, reviewed their chart. I have discussed this patient with the resident. I agree with the resident's findings, assessment and care plan. Patient has underlying pulmonary fibrosis so I expect the mild amount of CHF (BNP 3000) has tipped her over into the SOB. Hx of anemia and hgb 8 --striogly considering tnsfusion of at least 1 u PRBC.  Delightful woman who appears much younger than her stated age.

## 2013-08-06 DIAGNOSIS — I4891 Unspecified atrial fibrillation: Secondary | ICD-10-CM

## 2013-08-06 DIAGNOSIS — S069X9S Unspecified intracranial injury with loss of consciousness of unspecified duration, sequela: Secondary | ICD-10-CM

## 2013-08-06 DIAGNOSIS — I10 Essential (primary) hypertension: Secondary | ICD-10-CM

## 2013-08-06 DIAGNOSIS — D649 Anemia, unspecified: Secondary | ICD-10-CM

## 2013-08-06 DIAGNOSIS — F341 Dysthymic disorder: Secondary | ICD-10-CM

## 2013-08-06 DIAGNOSIS — F329 Major depressive disorder, single episode, unspecified: Secondary | ICD-10-CM

## 2013-08-06 DIAGNOSIS — D509 Iron deficiency anemia, unspecified: Secondary | ICD-10-CM

## 2013-08-06 DIAGNOSIS — I951 Orthostatic hypotension: Secondary | ICD-10-CM

## 2013-08-06 DIAGNOSIS — R0602 Shortness of breath: Secondary | ICD-10-CM

## 2013-08-06 DIAGNOSIS — K219 Gastro-esophageal reflux disease without esophagitis: Secondary | ICD-10-CM

## 2013-08-06 DIAGNOSIS — M353 Polymyalgia rheumatica: Secondary | ICD-10-CM

## 2013-08-06 LAB — CBC
HCT: 39.2 % (ref 36.0–46.0)
Hemoglobin: 12.2 g/dL (ref 12.0–15.0)
MCH: 25.4 pg — ABNORMAL LOW (ref 26.0–34.0)
MCHC: 31.1 g/dL (ref 30.0–36.0)
MCV: 81.7 fL (ref 78.0–100.0)
Platelets: 428 10*3/uL — ABNORMAL HIGH (ref 150–400)
RBC: 4.8 MIL/uL (ref 3.87–5.11)
RDW: 16.1 % — ABNORMAL HIGH (ref 11.5–15.5)
WBC: 8.8 10*3/uL (ref 4.0–10.5)

## 2013-08-06 LAB — BASIC METABOLIC PANEL
BUN: 10 mg/dL (ref 6–23)
CO2: 31 mEq/L (ref 19–32)
Calcium: 9.8 mg/dL (ref 8.4–10.5)
Chloride: 99 mEq/L (ref 96–112)
Creatinine, Ser: 0.98 mg/dL (ref 0.50–1.10)
GFR calc Af Amer: 58 mL/min — ABNORMAL LOW (ref >90)
GFR calc non Af Amer: 50 mL/min — ABNORMAL LOW (ref >90)
Glucose, Bld: 90 mg/dL (ref 70–99)
Potassium: 3.9 mEq/L (ref 3.5–5.1)
Sodium: 140 mEq/L (ref 135–145)

## 2013-08-06 LAB — TYPE AND SCREEN
Antibody Screen: NEGATIVE
Unit division: 0
Unit division: 0

## 2013-08-06 MED ORDER — FUROSEMIDE 20 MG PO TABS
20.0000 mg | ORAL_TABLET | Freq: Two times a day (BID) | ORAL | Status: DC
  Filled 2013-08-06 (×3): qty 1

## 2013-08-06 MED ORDER — FUROSEMIDE 20 MG PO TABS: 20.0000 mg | ORAL_TABLET | Freq: Two times a day (BID) | ORAL | Status: DC

## 2013-08-06 MED ORDER — POLYETHYLENE GLYCOL 3350 17 G PO PACK: 17.0000 g | PACK | Freq: Every day | ORAL | Status: DC

## 2013-08-06 MED ORDER — LISINOPRIL 2.5 MG PO TABS: 2.5000 mg | ORAL_TABLET | Freq: Every day | ORAL | Status: DC

## 2013-08-06 MED ORDER — FUROSEMIDE 40 MG PO TABS: 40.0000 mg | ORAL_TABLET | Freq: Two times a day (BID) | ORAL | Status: DC

## 2013-08-06 MED ORDER — CARVEDILOL 3.125 MG PO TABS: 3.1250 mg | ORAL_TABLET | Freq: Two times a day (BID) | ORAL | Status: DC

## 2013-08-06 MED ORDER — CARVEDILOL 3.125 MG PO TABS
3.1250 mg | ORAL_TABLET | Freq: Two times a day (BID) | ORAL | Status: DC
  Filled 2013-08-06 (×3): qty 1

## 2013-08-06 NOTE — Progress Notes (Signed)
Family Medicine Teaching Service Daily Progress Note Intern Pager: [phone]  Patient name: Diane Mejia Medical record number: [id number] Date of birth: [birth year] Age: 79 y.o. Gender: female  Primary Care Provider: Elvina Sidle, MD Consultants: none Code Status: full  Pt Overview and Major Events to Date:  10/24 - admitted with SOB and weakness  Assessment and Plan: Diane Mejia is a 79 y.o. female presenting with acute SOB and weakness. PMH is significant for iron deficiency anemia, Afib, depression, arthritis, polymyalgia rheumatica, orthostatic hypotension/HTN, subdural hematoma (03/26/13), GERD.   # CV: HTN and Afib on admission. BNP of 3112 suspiscious for CHF though pt wt down 15lbs in past several months. Trop neg.  - Admit to Tele  - 2D Echo w/ contrast   - Strict I/O  - daily wts  - Lasix 20 IV BID -> 20 po BID for home - Continue home Dilt 180  - Cont home ASA 81 and plavix (off Coumadin due to ICH in June)  - PT/OT  - TSH wnl  - continue lisinopril (2.5mg ), am creat 0.98, recheck at PCP next week - Start coreg 3.125 today - Echo: EF 45-50%, RA dilation, mod MR - F/u with outpt cardiologist, Dr. Claris Gower at [facility]   # Res: SOB likely secondary to CHF exacerbation. On O2 2L [place]. No home O2 requirement. CTA w/o PNA or PE.  - Breathing comfortably on RA this am, will check ambulatory O2 sat prior to dc   # Heme: Anemic to 8.1 w/ baseline around 11. No blood loss per pt. H/o iron deficiency anemia. Recent Colonoscopy w/o evidence of bleed  - IV iron x1  - cont home iron tablet  - s/p 2u PRBCs  - FOBT - not yet collected - ASA/ Plavix as above  - am CBC showed hgb 12.2 (up from 8.2 prior to transfusion)  # Psych: baseline depression. Well controlled  - Continue home Paxil   FEN/GI: Tolerating PO. No recent reflux  - Miralax PRN  - Heart healthy diet  - Hgb A1c 5.8 - Monitor K due to diuresis   Prophylaxis:  - SCD due to bleed risk/anemia   Disposition: Home  this pm following post-transfusion CBC check and ambulatory O2  Subjective: Feeling good this morning, breathing comfortably, no pain, very anxious to go home  Objective: Temp:  [97.3 F (36.3 C)-98.6 F (37 C)] 97.6 F (36.4 C) (10/24 0442) Pulse Rate:  [76-100] 85 (10/24 1116) Resp:  [18-20] 18 (10/24 0442) BP: (90-144)/(50-98) 99/53 mmHg (10/24 1116) SpO2:  [99 %-100 %] 100 % (10/24 0442) Weight:  [159 lb 6.3 oz (72.3 kg)] 159 lb 6.3 oz (72.3 kg) (10/24 0442) Physical Exam: General: mild distress, elderly, WNWD  HEENT: mmm, EOMI  Cardiovascular: irregularly irregular, II/VI systolic murmur, 2+ pulses  Respiratory:CTAB, good air movement. No wheezes  Abdomen: NABD, soft non-ttp  Extremities: well perfused, trace LE edema w/ significant adipose deposition in LE  Skin: diffuse echymoses  Neuro: AOx3, no focal deficits  Laboratory:  Recent Labs Lab 08/04/13 1539 08/05/13 0500 08/06/13 0905  WBC 7.2 8.2 8.8  HGB 8.1* 8.2* 12.2  HCT 27.3* 27.1* 39.2  PLT 374 395 428*    Recent Labs Lab 08/04/13 1539 08/05/13 0500 08/06/13 0905  NA 141 138 140  K 4.2 3.6 3.9  CL 106 102 99  CO2 26 26 31   BUN 14 11 10   CREATININE 0.90 0.78 0.98  CALCIUM 8.8 8.8 9.8  PROT  --  6.2  --  BILITOT  --  0.5  --   ALKPHOS  --  62  --   ALT  --  5  --   AST  --  11  --   GLUCOSE 100* 98 90    08/04/2013 21:33  Iron 15 (L)  UIBC 348  TIBC 363  Saturation Ratios 4 (L)  Ferritin 10  Folate >20.0   Hemoglobin A1C 5.8 TSH 1.449  Imaging/Diagnostic Tests: Echo:  - Left ventricle: The cavity size was normal. Wall thickness was increased in a pattern of moderate LVH. Systolic function was mildly to moderately reduced. The estimated ejection fraction was in the range of 40% to 45%. Wall motion was normal; there were no regional wall motion abnormalities. - Mitral valve: Mild to moderate regurgitation directed posteriorly. - Left atrium: The atrium was severely dilated. - Right  atrium: The atrium was moderately dilated. - Pulmonary arteries: Systolic pressure was mildly to moderately increased. PA peak pressure: 46mm Hg (S). - Pericardium, extracardiac: A trivial pericardial effusion was identified.  Beverely Low, MD 08/06/2013, 11:23 AM PGY-1, [facility] Family Medicine FPTS Intern pager: [phone], text pages welcome

## 2013-08-06 NOTE — Progress Notes (Signed)
Physical Therapy Evaluation Patient Details Name: Diane Mejia MRN: [id number] DOB: [birth year] Today's Date: 08/06/2013 Time: 4098-1191 PT Time Calculation (min): 22 min  PT Assessment / Plan / Recommendation History of Present Illness  Pt admit with SOB/weakness.  Clinical Impression  Pt admitted with above. Pt currently with functional limitations due to the deficits listed below (see PT Problem List).Pt will benefit from HHPT f/u to continue endurance and balance training.   Pt will benefit from skilled PT to increase their independence and safety with mobility to allow discharge to the venue listed below.     PT Assessment  Patient needs continued PT services    Follow Up Recommendations  Home health PT;Supervision/Assistance - 24 hour                Equipment Recommendations  None recommended by PT         Frequency Min 3X/week    Precautions / Restrictions Precautions Precautions: Fall Restrictions Weight Bearing Restrictions: No   Pertinent Vitals/Pain HR 131-158 bpm with activity; Sats >92% on RA.  No pain      Mobility  Bed Mobility Bed Mobility: Not assessed Transfers Transfers: Sit to Stand;Stand to Sit Sit to Stand: 4: Min guard;With upper extremity assist;With armrests;From chair/3-in-1 Stand to Sit: 4: Min guard;With upper extremity assist;With armrests;To chair/3-in-1 Details for Transfer Assistance: Needed cues for hand placement  Ambulation/Gait Ambulation/Gait Assistance: 4: Min assist Ambulation Distance (Feet): 100 Feet Assistive device: None Ambulation/Gait Assistance Details: Pt overall did well with no significant LOB however did need occasional steadying assist/HHA.  Pt uses RW at all times at home per pt but she just wanted to ambulate without RW today.  Pt states she will use RW at all times at home.  Husband to provide care.  Checked sats with sats >92% on RA with ambulation and DOE 1/4.   Gait Pattern: Decreased stride length;Trunk  flexed Gait velocity: decreased Stairs: No Wheelchair Mobility Wheelchair Mobility: No         PT Diagnosis: Generalized weakness  PT Problem List: Decreased activity tolerance;Decreased balance;Decreased mobility;Decreased knowledge of use of DME;Decreased safety awareness;Decreased knowledge of precautions PT Treatment Interventions: Gait training;Functional mobility training;Therapeutic activities;DME instruction;Therapeutic exercise;Balance training;Patient/family education     PT Goals(Current goals can be found in the care plan section) Acute Rehab PT Goals Patient Stated Goal: to go home PT Goal Formulation: With patient Time For Goal Achievement: 08/13/13 Potential to Achieve Goals: Good  Visit Information  Last PT Received On: 08/06/13 Assistance Needed: +1 History of Present Illness: Pt admit with SOB/weakness.       Prior Functioning  Home Living Family/patient expects to be discharged to:: Private residence Living Arrangements: Spouse/significant other;Children Available Help at Discharge: Family;Available 24 hours/day Type of Home: House Home Access: Stairs to enter [employer] of Steps: 3 Entrance Stairs-Rails: Can reach both Home Layout: One level Home Equipment: Cane - single point;Walker - 4 wheels;Walker - 2 wheels;Grab bars - tub/shower Prior Function Level of Independence: Independent with assistive device(s) Communication Communication: No difficulties Dominant Hand: Right    Cognition  Cognition Arousal/Alertness: Awake/alert Behavior During Therapy: WFL for tasks assessed/performed Overall Cognitive Status: Within Functional Limits for tasks assessed    Extremity/Trunk Assessment Upper Extremity Assessment Upper Extremity Assessment: Defer to OT evaluation Lower Extremity Assessment Lower Extremity Assessment: Generalized weakness Cervical / Trunk Assessment Cervical / Trunk Assessment: Normal   Balance    End of Session PT -  End of Session Equipment Utilized During Treatment:  Gait belt Activity Tolerance: Patient tolerated treatment well Patient left: in chair;with call bell/phone within reach;with family/visitor present Nurse Communication: Mobility status       INGOLD,Shantele Reller 08/06/2013, 12:01 PM [facility] Acute Rehabilitation [phone] (pager)

## 2013-08-06 NOTE — Progress Notes (Signed)
Diane Mejia to be D/C'd Home per MD order.  Discussed with the patient and all questions fully answered.    Medication List         aspirin EC 81 MG tablet  Take 81 mg by mouth at bedtime.     carvedilol 3.125 MG tablet  Commonly known as:  COREG  Take 1 tablet (3.125 mg total) by mouth 2 (two) times daily with a meal.     cholecalciferol 400 UNITS Tabs tablet  Commonly known as:  VITAMIN D  Take 400 Units by mouth daily.     clopidogrel 75 MG tablet  Commonly known as:  PLAVIX  Take 75 mg by mouth daily.     diltiazem 180 MG 24 hr capsule  Commonly known as:  CARDIZEM CD  Take 180 mg by mouth at bedtime.     ferrous fumarate 325 (106 FE) MG Tabs tablet  Commonly known as:  HEMOCYTE - 106 mg FE  Take 1 tablet by mouth every morning.     furosemide 20 MG tablet  Commonly known as:  LASIX  Take 1 tablet (20 mg total) by mouth 2 (two) times daily.     lisinopril 2.5 MG tablet  Commonly known as:  PRINIVIL,ZESTRIL  Take 1 tablet (2.5 mg total) by mouth daily.     multivitamin tablet  Take 1 tablet by mouth every morning.     oxyCODONE-acetaminophen 5-325 MG per tablet  Commonly known as:  PERCOCET/ROXICET  Take 1 tablet by mouth every 4 (four) hours as needed for pain.     PARoxetine 20 MG tablet  Commonly known as:  PAXIL  Take 20 mg by mouth every evening.     polyethylene glycol packet  Commonly known as:  MIRALAX / GLYCOLAX  Take 17 g by mouth daily. You can increase or decrease this dose as needed to produce 1-2 soft bowel movements per day.        VVS, Skin clean, dry and intact without evidence of skin break down, no evidence of skin tears noted. IV catheter discontinued intact. Site without signs and symptoms of complications. Dressing and pressure applied.  An After Visit Summary was printed and given to the patient. Patient escorted via WC, and D/C home via private auto.  [name] 08/06/2013 12:11 PM

## 2013-08-06 NOTE — Progress Notes (Signed)
Notified Family Medicine on-call doctor at this time that patient's heart rate went up to the 160's while physical therapy was working with patient but did not sustain on several occassions. Also notified doctor that the patient's blood pressure was a little low this morning. Parameters given to hold blood pressure meds if systolic is below 100 manually and to hold blood pressure meds if diastolic is below 50 manually. Patient is asymptomatic and complains of no discomfort. Will continue to monitor to end of shift.

## 2013-08-06 NOTE — Progress Notes (Signed)
FMTS Attending Daily Note:  Renold Don MD  [phone] pager  Family Practice pager:  [phone] I have seen and examined this patient and have reviewed their chart. I have discussed this patient with the resident. I agree with the resident's findings, assessment and care plan.  Additionally:  Patient much improved today from yesterday. Hgb has trended up nicely.   Known history of A.fib.  Followed closely by cardiology.  Likely symptomatic anemia for cause of her dyspnea.  No events on tele overnight.  No further need of oxygen.   Plan to DC home today with close outpt followup  Tobey Grim, MD 08/06/2013 2:09 PM

## 2013-08-06 NOTE — Discharge Summary (Signed)
Family Medicine Teaching [facility] Discharge Summary  Patient name: Diane Mejia Medical record number: [id number] Date of birth: November 13, 1922 Age: 79 y.o. Gender: female Date of Admission: 08/04/2013  Date of Discharge: 08/06/13 Admitting Physician: Nestor Ramp, MD  Primary Care Provider: Elvina Sidle, MD Consultants: none  Indication for Hospitalization: dyspnea  Discharge Diagnoses/Problem List:  Patient Active Problem List   Diagnosis Date Noted  . CHF exacerbation 08/05/2013  . Orthostatic hypotension 05/08/2013  . Depressive disorder, not elsewhere classified 04/27/2013  . Fall 03/30/2013  . Traumatic subdural hematoma 03/30/2013  . Right wrist fracture 03/30/2013  . Herpes zoster ophthalmicus 11/19/2012  . Polymyalgia rheumatica 12/08/2011  . Weakness 11/30/2011  . GERD (gastroesophageal reflux disease) 11/29/2011  . Hypertension 11/29/2011  . Sinus arrhythmia 11/29/2011  . Iron deficiency anemia 11/29/2011  . Anxiety and depression 11/29/2011  . Atrial fibrillation 11/29/2011    Disposition: home   Discharge Condition: improved  Discharge Exam:  General: mild distress, elderly, WNWD  HEENT: mmm, EOMI  Cardiovascular: irregularly irregular, II/VI systolic murmur, 2+ pulses  Respiratory:CTAB, good air movement. No wheezes  Abdomen: NABD, soft non-ttp  Extremities: well perfused, trace LE edema w/ significant adipose deposition in LE  Skin: diffuse echymoses  Neuro: AOx3, no focal deficits  Brief Hospital Course: Diane Mejia is a 79 y.o. female presenting with acute SOB and weakness. PMH is significant for iron deficiency anemia, Afib, depression, arthritis, polymyalgia rheumatica, orthostatic hypotension/HTN, subdural hematoma (03/26/13), GERD.   # CV: Patient's presentation was consistent with CHF exacerbation. An echo was done which confirmed systolic failure with an EF of 45-50%. Her respiratory status improved with diuresis and she weaned off O2.  She ambulated without dropping her O2 saturation prior to discharge. She had a few runs of RVR from her afib, with activity and not  Sustained so no adjustments were made to her diltiazem for rate control. Lisinopril was added on admission but her pressures did not tolerate the addition of coreg so this was deferred to the outpatient setting. She was discharged on 20 of lasix BID to be titrated by her cardiologist.  # Respiratory/heme: Patient's respiratory distress was likely due to her CHF exacerbation and her known pulmonary fibrosis. She had severe iron-deficiency anemia and was transfused to maximize oxygen carrying capacity given these other hits to her oxygenating ability. She did well with this and felt stronger and saturating well at time of discharge.  # Psych: baseline depression. Well controlled. Her home Paxil was continued.  Issues for Follow Up:  1. HTN/CHF: Coreg was not able to be started on the day of discharge due to BP in 90/50s. Defer to cardiologist, Dr. Claris Gower at [facility], on whether to start in outpatient setting. She will likely be able to tolerate this when she has not just been aggressively diuresed.  2. Anemia: Follow CBC and continue po iron. Unclear where this is coming from as she had recent normal colonoscopy. Consider further work-up. May need periodic IV iron if this continues.  Significant Procedures: echo  Significant Labs and Imaging:   Recent Labs Lab 08/04/13 1539 08/05/13 0500 08/06/13 0905  WBC 7.2 8.2 8.8  HGB 8.1* 8.2* 12.2  HCT 27.3* 27.1* 39.2  PLT 374 395 428*    Recent Labs Lab 08/04/13 1539 08/05/13 0500 08/06/13 0905  NA 141 138 140  K 4.2 3.6 3.9  CL 106 102 99  CO2 26 26 31   GLUCOSE 100* 98 90  BUN 14 11 10   CREATININE  0.90 0.78 0.98  CALCIUM 8.8 8.8 9.8  ALKPHOS  --  62  --   AST  --  11  --   ALT  --  5  --   ALBUMIN  --  3.0*  --     08/04/2013 21:33   Iron  15 (L)   UIBC  348   TIBC  363   Saturation Ratios  4 (L)    Ferritin  10   Folate  >20.0    Hemoglobin A1C 5.8  TSH 1.449   Echo:  - Left ventricle: The cavity size was normal. Wall thickness was increased in a pattern of moderate LVH. Systolic function was mildly to moderately reduced. The estimated ejection fraction was in the range of 40% to 45%. Wall motion was normal; there were no regional wall motion abnormalities. - Mitral valve: Mild to moderate regurgitation directed posteriorly. - Left atrium: The atrium was severely dilated. - Right atrium: The atrium was moderately dilated. - Pulmonary arteries: Systolic pressure was mildly to moderately increased. PA peak pressure: 46mm Hg (S). - Pericardium, extracardiac: A trivial pericardial effusion was identified.  Results/Tests Pending at Time of Discharge: none  Discharge Medications:    Medication List         aspirin EC 81 MG tablet  Take 81 mg by mouth at bedtime.     carvedilol 3.125 MG tablet  Commonly known as:  COREG  Take 1 tablet (3.125 mg total) by mouth 2 (two) times daily with a meal.     cholecalciferol 400 UNITS Tabs tablet  Commonly known as:  VITAMIN D  Take 400 Units by mouth daily.     clopidogrel 75 MG tablet  Commonly known as:  PLAVIX  Take 75 mg by mouth daily.     diltiazem 180 MG 24 hr capsule  Commonly known as:  CARDIZEM CD  Take 180 mg by mouth at bedtime.     ferrous fumarate 325 (106 FE) MG Tabs tablet  Commonly known as:  HEMOCYTE - 106 mg FE  Take 1 tablet by mouth every morning.     furosemide 20 MG tablet  Commonly known as:  LASIX  Take 1 tablet (20 mg total) by mouth 2 (two) times daily.     lisinopril 2.5 MG tablet  Commonly known as:  PRINIVIL,ZESTRIL  Take 1 tablet (2.5 mg total) by mouth daily.     multivitamin tablet  Take 1 tablet by mouth every morning.     oxyCODONE-acetaminophen 5-325 MG per tablet  Commonly known as:  PERCOCET/ROXICET  Take 1 tablet by mouth every 4 (four) hours as needed for pain.     PARoxetine 20  MG tablet  Commonly known as:  PAXIL  Take 20 mg by mouth every evening.     polyethylene glycol packet  Commonly known as:  MIRALAX / GLYCOLAX  Take 17 g by mouth daily. You can increase or decrease this dose as needed to produce 1-2 soft bowel movements per day.       Discharge Instructions: Please refer to Patient Instructions section of EMR for full details.  Patient was counseled important signs and symptoms that should prompt return to medical care, changes in medications, dietary instructions, activity restrictions, and follow up appointments.   Follow-Up Appointments: Follow-up Information   Follow up with Elvina Sidle, MD. Schedule an appointment as soon as possible for a visit on 08/09/2013. (walk-in appt. anytime between 7:45am and 2pm)    Specialty:  Family Medicine  Contact information:   [address] [place] [address] [phone]       Follow up with Redge Gainer, MD. Schedule an appointment as soon as possible for a visit on 08/10/2013. (Appt. Tuesday, November 28th at 1:15 pm at [facility] in the [facility] on the 7th floor)    Specialty:  Internal Medicine   Contact information:   [address] [place] [address] [phone]      Beverely Low, MD 08/07/2013, 11:46 AM PGY-1, [facility]

## 2013-08-07 NOTE — Discharge Summary (Signed)
Family Medicine Teaching Service  Discharge Note : Attending Jeff Dmitry Macomber MD Pager [phone] Inpatient Team Pager:  [phone]  I have reviewed this patient and the patient's chart and have discussed discharge planning with the resident at the time of discharge. I agree with the discharge plan as above.    

## 2013-08-08 ENCOUNTER — Other Ambulatory Visit: Payer: Self-pay | Admitting: Family Medicine

## 2013-08-08 DIAGNOSIS — I509 Heart failure, unspecified: Secondary | ICD-10-CM

## 2013-08-08 DIAGNOSIS — I10 Essential (primary) hypertension: Secondary | ICD-10-CM

## 2013-08-08 DIAGNOSIS — I4891 Unspecified atrial fibrillation: Secondary | ICD-10-CM

## 2013-08-08 DIAGNOSIS — D649 Anemia, unspecified: Secondary | ICD-10-CM

## 2013-08-08 NOTE — ED Provider Notes (Signed)
Medical screening examination/treatment/procedure(s) were conducted as a shared visit with resident-physician practitioner(s) and myself.  I personally evaluated the patient during the encounter.  Pt is a 79 y.o. female with pmhx as above presenting with SOB for about 1 week, worse on exertion.  On PE, Pt in NAD, in afib w/ rate <110, Nml O2 sats on RA.  Lungs clear.  Pt found to have new BNP elevation, cardiomegaly, drop in Hb from baseline to 8.1 with unknown cause.  Hospitalist will admit.    I agree w/ Dr. Lubertha Basque EKG assessment.   Shanna Cisco, MD 08/08/13 [phone]

## 2013-08-10 DIAGNOSIS — I4891 Unspecified atrial fibrillation: Secondary | ICD-10-CM | POA: Diagnosis not present

## 2013-08-10 DIAGNOSIS — D649 Anemia, unspecified: Secondary | ICD-10-CM | POA: Diagnosis not present

## 2013-08-10 DIAGNOSIS — R002 Palpitations: Secondary | ICD-10-CM | POA: Diagnosis not present

## 2013-08-10 DIAGNOSIS — I509 Heart failure, unspecified: Secondary | ICD-10-CM | POA: Diagnosis not present

## 2013-08-10 DIAGNOSIS — F430 Acute stress reaction: Secondary | ICD-10-CM | POA: Diagnosis not present

## 2013-08-19 DIAGNOSIS — S52599A Other fractures of lower end of unspecified radius, initial encounter for closed fracture: Secondary | ICD-10-CM | POA: Diagnosis not present

## 2013-09-06 ENCOUNTER — Other Ambulatory Visit: Payer: Self-pay | Admitting: *Deleted

## 2013-09-06 DIAGNOSIS — D509 Iron deficiency anemia, unspecified: Secondary | ICD-10-CM

## 2013-09-07 ENCOUNTER — Ambulatory Visit (HOSPITAL_BASED_OUTPATIENT_CLINIC_OR_DEPARTMENT_OTHER): Payer: Medicare Other | Admitting: Hematology & Oncology

## 2013-09-07 ENCOUNTER — Other Ambulatory Visit (HOSPITAL_BASED_OUTPATIENT_CLINIC_OR_DEPARTMENT_OTHER): Payer: Medicare Other | Admitting: Lab

## 2013-09-07 VITALS — BP 77/46 | HR 77 | Temp 97.7°F | Resp 14 | Ht 65.0 in | Wt 159.0 lb

## 2013-09-07 DIAGNOSIS — D509 Iron deficiency anemia, unspecified: Secondary | ICD-10-CM

## 2013-09-07 DIAGNOSIS — Z862 Personal history of diseases of the blood and blood-forming organs and certain disorders involving the immune mechanism: Secondary | ICD-10-CM

## 2013-09-07 DIAGNOSIS — R42 Dizziness and giddiness: Secondary | ICD-10-CM

## 2013-09-07 DIAGNOSIS — I959 Hypotension, unspecified: Secondary | ICD-10-CM

## 2013-09-07 LAB — CBC WITH DIFFERENTIAL (CANCER CENTER ONLY)
BASO#: 0.1 10*3/uL (ref 0.0–0.2)
BASO%: 0.9 % (ref 0.0–2.0)
EOS%: 7.7 % — ABNORMAL HIGH (ref 0.0–7.0)
HGB: 14 g/dL (ref 11.6–15.9)
LYMPH#: 2.3 10*3/uL (ref 0.9–3.3)
MCH: 27.4 pg (ref 26.0–34.0)
MCHC: 30.8 g/dL — ABNORMAL LOW (ref 32.0–36.0)
MONO#: 0.8 10*3/uL (ref 0.1–0.9)
NEUT#: 5.1 10*3/uL (ref 1.5–6.5)
Platelets: 281 10*3/uL (ref 145–400)
RDW: 22.6 % — ABNORMAL HIGH (ref 11.1–15.7)
WBC: 8.9 10*3/uL (ref 3.9–10.0)

## 2013-09-07 LAB — IRON AND TIBC CHCC
%SAT: 25 % (ref 21–57)
Iron: 63 ug/dL (ref 41–142)
TIBC: 249 ug/dL (ref 236–444)

## 2013-09-07 LAB — FERRITIN CHCC: Ferritin: 241 ng/ml (ref 9–269)

## 2013-09-07 NOTE — Progress Notes (Signed)
This office note has been dictated.

## 2013-09-18 NOTE — Progress Notes (Signed)
CC:   Diane Mejia, M.D.  DIAGNOSIS:  History of iron deficiency anemia.  CURRENT THERAPY:  Observation.  INTERIM HISTORY:  Diane Mejia comes in for her followup.  We last saw her back in May of last year.  Since then, she has had a lot of issues. Thankfully, I do not think anything has proved to have been related to her blood.  She has been hospitalized a few times.  She, I think, was anemic from what she said when she was hospitalized back in June.  She states she got a couple units of blood.  She was hospitalized back in October of this year.  She was found to be iron deficient.  I am not sure if she got any iron while in the hospital.  She is on oral iron right now.  She was referred back to the [facility] to see if we could help out with the anemia.  She is on oral iron.  She is doing well with the oral iron.  She is not having problems with constipation.  She has had no issues with nausea or vomiting.  She has had no rashes.  She is on four different blood pressure medications.  Her blood pressures have been low.  She has been having some dizziness.  I told her that she really needs to go see her cardiologist to see about her medications being readjusted.  PHYSICAL EXAMINATION:  General:  This is an elderly, somewhat feeble appearing white female.  Her vital signs show temperature of 97.7, pulse 77, respiratory rate 14, blood pressure is 82/40.  Weight is 159 pounds. Head and Neck:  Normocephalic, atraumatic skull.  She has no ocular or oral lesions.  There is no scleral icterus.  There is no adenopathy in the neck.  Lungs:  Clear bilaterally.  Cardiac:  Regular rate and rhythm with an occasional extra beat.  She may have some atrial fibrillation changes.  She has a 1/6 systolic ejection murmur.  Abdomen:  Soft.  She has good bowel sounds.  There is no fluid wave.  There is no palpable abdominal mass.  There is no palpable hepatosplenomegaly.   Extremities: Osteoarthritic changes in her joints.  She has some age-related muscle atrophy.  She has no edema in her legs.  She has 4/5 strength in her arms and legs.  Neurological:  No focal neurological deficits.  LABORATORY STUDIES:  White cell count is 8.9, hemoglobin 14, hematocrit 45.4, and platelet count 281.  Ferritin is 241 with an iron saturation of 25%.  IMPRESSION:  Diane Mejia is a nice 79 year old white female.  We last saw her a year and a half ago.  She came in because of, I guess, anemia. The anemia clearly has resolved.  She is on oral iron.  I do not see any hematologic issues that we need to deal with.  I did look at her blood smear.  I do not see anything on her blood smear that looked suspicious.  She had a good maturation of her red cells. There were no nucleated red cells.  I saw no teardrop cells.  She has no rouleaux formation.  White cells appear mature.  There are no hypersegmented polys.  Platelets are adequate in number and size.  Again, Diane Mejia's iron deficiency clearly has resolved.  I told her to stay on the oral iron.  I told her to stop her lisinopril.  I also told her to only take the Lasix  if necessary.  Again, I reiterated to her that she really needs to see her family doctor or cardiologist regarding her medications, so they can be adjusted.  We will get Diane Mejia back to see [place] in about 4 months' time now.    ______________________________ Josph Macho, M.D. PRE/MEDQ  D:  09/07/2013  T:  09/17/2013  Job:  [id number]

## 2013-09-29 ENCOUNTER — Ambulatory Visit (INDEPENDENT_AMBULATORY_CARE_PROVIDER_SITE_OTHER): Payer: Medicare Other | Admitting: Family Medicine

## 2013-09-29 VITALS — BP 138/62 | HR 112 | Temp 97.4°F | Resp 16 | Ht 62.25 in | Wt 157.0 lb

## 2013-09-29 DIAGNOSIS — F32A Depression, unspecified: Secondary | ICD-10-CM

## 2013-09-29 DIAGNOSIS — F329 Major depressive disorder, single episode, unspecified: Secondary | ICD-10-CM

## 2013-09-29 DIAGNOSIS — I509 Heart failure, unspecified: Secondary | ICD-10-CM | POA: Diagnosis not present

## 2013-09-29 MED ORDER — MIRTAZAPINE 15 MG PO TABS: 15.0000 mg | ORAL_TABLET | Freq: Every day | ORAL | Status: DC

## 2013-09-29 MED ORDER — FUROSEMIDE 20 MG PO TABS: 20.0000 mg | ORAL_TABLET | Freq: Every day | ORAL | Status: DC

## 2013-09-29 NOTE — Progress Notes (Signed)
This 79 year old woman was recently discharged from the hospital. She's had congestive heart failure and chronic severe back pain. She's been very discouraged with these repeated hospitalizations and chronic pain that she's had 2 endure.  He's been stable since she was discharged. She's had no increasing dyspnea or orthopnea.  Objective: Lungs have some dry crackles in both bases but are relatively clear. Patient moving well with her walker. Her heart is irregular with a 2/6 systolic ejection type murmur. Extremities: Show no edema  Assessment: Congestive heart failure, chronic associated with depression. Patient suffering from chronic back pain which is inoperable.  Plan: The Paxil did not work and neither did the Wellbutrin. Therefore will try Remeron 15 mg each bedtime. I've asked to come back in a month.  Spent 30 minutes in the room they patient  Signed, Sheila Oats.D.

## 2013-09-29 NOTE — Patient Instructions (Signed)
Heart Failure °Heart failure is a condition in which the heart has trouble pumping blood. This means your heart does not pump blood efficiently for your body to work well. In some cases of heart failure, fluid may back up into your lungs or you may have swelling (edema) in your lower legs. Heart failure is usually a long-term (chronic) condition. It is important for you to take good care of yourself and follow your caregiver's treatment plan. °CAUSES  °Some health conditions can cause heart failure. Those health conditions include: °· High blood pressure (hypertension) causes the heart muscle to work harder than normal. When pressure in the blood vessels is high, the heart needs to pump (contract) with more force in order to circulate blood throughout the body. High blood pressure eventually causes the heart to become stiff and weak. °· Coronary artery disease (CAD) is the buildup of cholesterol and fat (plaque) in the arteries of the heart. The blockage in the arteries deprives the heart muscle of oxygen and blood. This can cause chest pain and may lead to a heart attack. High blood pressure can also contribute to CAD. °· Heart attack (myocardial infarction) occurs when 1 or more arteries in the heart become blocked. The loss of oxygen damages the muscle tissue of the heart. When this happens, part of the heart muscle dies. The injured tissue does not contract as well and weakens the heart's ability to pump blood. °· Abnormal heart valves can cause heart failure when the heart valves do not open and close properly. This makes the heart muscle pump harder to keep the blood flowing. °· Heart muscle disease (cardiomyopathy or myocarditis) is damage to the heart muscle from a variety of causes. These can include drug or alcohol abuse, infections, or unknown reasons. These can increase the risk of heart failure. °· Lung disease makes the heart work harder because the lungs do not work properly. This can cause a strain  on the heart, leading it to fail. °· Diabetes increases the risk of heart failure. High blood sugar contributes to high fat (lipid) levels in the blood. Diabetes can also cause slow damage to tiny blood vessels that carry important nutrients to the heart muscle. When the heart does not get enough oxygen and food, it can cause the heart to become weak and stiff. This leads to a heart that does not contract efficiently. °· Other conditions can contribute to heart failure. These include abnormal heart rhythms, thyroid problems, and low blood counts (anemia). °Certain unhealthy behaviors can increase the risk of heart failure. Those unhealthy behaviors include: °· Being overweight. °· Smoking or chewing tobacco. °· Eating foods high in fat and cholesterol. °· Abusing illicit drugs or alcohol. °· Lacking physical activity. °SYMPTOMS  °Heart failure symptoms may vary and can be hard to detect. Symptoms may include: °· Shortness of breath with activity, such as climbing stairs. °· Persistent cough. °· Swelling of the feet, ankles, legs, or abdomen. °· Unexplained weight gain. °· Difficulty breathing when lying flat (orthopnea). °· Waking from sleep because of the need to sit up and get more air. °· Rapid heartbeat. °· Fatigue and loss of energy. °· Feeling lightheaded, dizzy, or close to fainting. °· Loss of appetite. °· Nausea. °· Increased urination during the night (nocturia). °DIAGNOSIS  °A diagnosis of heart failure is based on your history, symptoms, physical examination, and diagnostic tests. °Diagnostic tests for heart failure may include: °· Echocardiography. °· Electrocardiography. °· Chest X-ray. °· Blood tests. °· Exercise   stress test. °· Cardiac angiography. °· Radionuclide scans. °TREATMENT  °Treatment is aimed at managing the symptoms of heart failure. Medicines, behavioral changes, or surgical intervention may be necessary to treat heart failure. °· Medicines to help treat heart failure may  include: °· Angiotensin-converting enzyme (ACE) inhibitors. This type of medicine blocks the effects of a blood protein called angiotensin-converting enzyme. ACE inhibitors relax (dilate) the blood vessels and help lower blood pressure. °· Angiotensin receptor blockers. This type of medicine blocks the actions of a blood protein called angiotensin. Angiotensin receptor blockers dilate the blood vessels and help lower blood pressure. °· Water pills (diuretics). Diuretics cause the kidneys to remove salt and water from the blood. The extra fluid is removed through urination. This loss of extra fluid lowers the volume of blood the heart pumps. °· Beta blockers. These prevent the heart from beating too fast and improve heart muscle strength. °· Digitalis. This increases the force of the heartbeat. °· Healthy behavior changes include: °· Obtaining and maintaining a healthy weight. °· Stopping smoking or chewing tobacco. °· Eating heart healthy foods. °· Limiting or avoiding alcohol. °· Stopping illicit drug use. °· Physical activity as directed by your caregiver. °· Surgical treatment for heart failure may include: °· A procedure to open blocked arteries, repair damaged heart valves, or remove damaged heart muscle tissue. °· A pacemaker to improve heart muscle function and control certain abnormal heart rhythms. °· An internal cardioverter defibrillator to treat certain serious abnormal heart rhythms. °· A left ventricular assist device to assist the pumping ability of the heart. °HOME CARE INSTRUCTIONS  °· Take your medicine as directed by your caregiver. Medicines are important in reducing the workload of your heart, slowing the progression of heart failure, and improving your symptoms. °· Do not stop taking your medicine unless directed by your caregiver. °· Do not skip any dose of medicine. °· Refill your prescriptions before you run out of medicine. Your medicines are needed every day. °· Take over-the-counter  medicine only as directed by your caregiver or pharmacist. °· Engage in moderate physical activity if directed by your caregiver. Moderate physical activity can benefit some people. The elderly and people with severe heart failure should consult with a caregiver for physical activity recommendations. °· Eat heart healthy foods. Food choices should be free of trans fat and low in saturated fat, cholesterol, and salt (sodium). Healthy choices include fresh or frozen fruits and vegetables, fish, lean meats, legumes, fat-free or low-fat dairy products, and whole grain or high fiber foods. Talk to a dietitian to learn more about heart healthy foods. °· Limit sodium if directed by your caregiver. Sodium restriction may reduce symptoms of heart failure in some people. Talk to a dietitian to learn more about heart healthy seasonings. °· Use healthy cooking methods. Healthy cooking methods include roasting, grilling, broiling, baking, poaching, steaming, or stir-frying. Talk to a dietitian to learn more about healthy cooking methods. °· Limit fluids if directed by your caregiver. Fluid restriction may reduce symptoms of heart failure in some people. °· Weigh yourself every day. Daily weights are important in the early recognition of excess fluid. You should weigh yourself every morning after you urinate and before you eat breakfast. Wear the same amount of clothing each time you weigh yourself. Record your daily weight. Provide your caregiver with your weight record. °· Monitor and record your blood pressure if directed by your caregiver. °· Check your pulse if directed by your caregiver. °· Lose weight if directed   by your caregiver. Weight loss may reduce symptoms of heart failure in some people. °· Stop smoking or chewing tobacco. Nicotine makes your heart work harder by causing your blood vessels to constrict. Do not use nicotine gum or patches before talking to your caregiver. °· Schedule and attend follow-up visits as  directed by your caregiver. It is important to keep all your appointments. °· Limit alcohol intake to no more than 1 drink per day for nonpregnant women and 2 drinks per day for men. Drinking more than that is harmful to your heart. Tell your caregiver if you drink alcohol several times a week. Talk with your caregiver about whether alcohol is safe for you. If your heart has already been damaged by alcohol or you have severe heart failure, drinking alcohol should be stopped completely. °· Stop illicit drug use. °· Stay up-to-date with immunizations. It is especially important to prevent respiratory infections through current pneumococcal and influenza immunizations. °· Manage other health conditions such as hypertension, diabetes, thyroid disease, or abnormal heart rhythms as directed by your caregiver. °· Learn to manage stress. °· Plan rest periods when fatigued. °· Learn strategies to manage high temperatures. If the weather is extremely hot: °· Avoid vigorous physical activity. °· Use air conditioning or fans or seek a cooler location. °· Avoid caffeine and alcohol. °· Wear loose-fitting, lightweight, and light-colored clothing. °· Learn strategies to manage cold temperatures. If the weather is extremely cold: °· Avoid vigorous physical activity. °· Layer clothes. °· Wear mittens or gloves, a hat, and a scarf when going outside. °· Avoid alcohol. °· Obtain ongoing education and support as needed. °· Participate or seek rehabilitation as needed to maintain or improve independence and quality of life. °SEEK MEDICAL CARE IF:  °· Your weight increases by 03 lb/1.4 kg in 1 day or 05 lb/2.3 kg in a week. °· You have increasing shortness of breath that is unusual for you. °· You are unable to participate in your usual physical activities. °· You tire easily. °· You cough more than normal, especially with physical activity. °· You have any or more swelling in areas such as your hands, feet, ankles, or abdomen. °· You  are unable to sleep because it is hard to breathe. °· You feel like your heart is beating fast (palpitations). °· You become dizzy or lightheaded upon standing up. °SEEK IMMEDIATE MEDICAL CARE IF:  °· You have difficulty breathing. °· There is a change in mental status such as decreased alertness or difficulty with concentration. °· You have a pain or discomfort in your chest. °· You have an episode of fainting (syncope). °MAKE SURE YOU:  °· Understand these instructions. °· Will watch your condition. °· Will get help right away if you are not doing well or get worse. °Document Released: 09/30/2005 Document Revised: 01/25/2013 Document Reviewed: 10/22/2012 °[employer]® Patient Information ©2014 [employer]. ° °

## 2013-10-03 ENCOUNTER — Other Ambulatory Visit (HOSPITAL_COMMUNITY): Payer: Self-pay | Admitting: Family Medicine

## 2013-10-04 NOTE — Telephone Encounter (Signed)
Dr L, I do not see this med on pt's med list at last 2 OVs. Do you want pt on lisinopril?

## 2013-10-25 ENCOUNTER — Other Ambulatory Visit: Payer: Self-pay | Admitting: Family Medicine

## 2013-10-27 NOTE — Telephone Encounter (Signed)
Sending RF of phenergan

## 2013-11-11 DIAGNOSIS — I4891 Unspecified atrial fibrillation: Secondary | ICD-10-CM | POA: Diagnosis not present

## 2013-11-11 DIAGNOSIS — I4949 Other premature depolarization: Secondary | ICD-10-CM | POA: Diagnosis not present

## 2013-11-24 ENCOUNTER — Telehealth: Payer: Self-pay | Admitting: *Deleted

## 2013-11-24 NOTE — Telephone Encounter (Signed)
Son called Jerzy Crotteau (he is not listed on HIPPA). He is concerned that pt is having side effects from medication prescribed. Remeron was prescribed in December. Pt is now "hoarding" her medications and has expressed that she is going to kill herself. Pt refused to come to clinic when urged by family members. I have advised Josph Macho that if he is concerned that she is going to hurt herself he can call 911.   Fred's number is [phone].  Daughter is Leotis Shames [phone] work, [phone] home. She is listed on HIPPA.

## 2013-11-25 NOTE — Telephone Encounter (Signed)
Spoke to [employer].  She is asking [place] to call Di to come in this evening to have her hemoglobin checked after 5pm today. Fraser Din will come with pt and bring up the issue of the suicide and medication hoarding.

## 2013-11-25 NOTE — Telephone Encounter (Signed)
Mardene Celeste pts daughter is calling with a different number to reach her [phone] or [phone]. She knows that Dr.Laenstein is out of town but would like to know if someone else can review the previous message during his absence.

## 2013-12-08 ENCOUNTER — Telehealth: Payer: Self-pay

## 2013-12-08 ENCOUNTER — Other Ambulatory Visit: Payer: Self-pay | Admitting: *Deleted

## 2013-12-08 DIAGNOSIS — N63 Unspecified lump in unspecified breast: Secondary | ICD-10-CM

## 2013-12-08 NOTE — Telephone Encounter (Signed)
Referral made , pt notified. Faxed over .

## 2013-12-08 NOTE — Telephone Encounter (Signed)
PT STATES SHE NOTICED A LUMP IN HER BREAST AND CALLED [facility], THEY WILL BE ABLE TO SEE HER ASAP IF WE WOULD FAX OVER A REFERRAL FOR HER THE NUMBER TO [facility] IS [phone] AND YOU MAY REACH PT AT [phone] NEED THIS DONE SOON STATES PT

## 2013-12-13 DIAGNOSIS — N6019 Diffuse cystic mastopathy of unspecified breast: Secondary | ICD-10-CM | POA: Diagnosis not present

## 2013-12-13 DIAGNOSIS — N63 Unspecified lump in unspecified breast: Secondary | ICD-10-CM | POA: Diagnosis not present

## 2013-12-13 DIAGNOSIS — Z803 Family history of malignant neoplasm of breast: Secondary | ICD-10-CM | POA: Diagnosis not present

## 2013-12-21 ENCOUNTER — Telehealth: Payer: Self-pay

## 2013-12-21 NOTE — Telephone Encounter (Signed)
PATRICIA WOULD LIKE TO SPEAK WITH DR Synetta Shadow REGARDING HER MOM PLEASE CALL [phone]

## 2013-12-21 NOTE — Telephone Encounter (Signed)
Dr. Joseph Art- please see previous phone messages. Daughter would like to talk to you about intervening in mothers care.

## 2013-12-22 ENCOUNTER — Encounter (HOSPITAL_COMMUNITY): Payer: Self-pay | Admitting: Emergency Medicine

## 2013-12-22 ENCOUNTER — Emergency Department (HOSPITAL_COMMUNITY)
Admission: EM | Admit: 2013-12-22 | Discharge: 2013-12-23 | Disposition: A | Discharge status: Home / Self Care | Payer: Medicare Other | Attending: Emergency Medicine | Admitting: Emergency Medicine

## 2013-12-22 ENCOUNTER — Emergency Department (HOSPITAL_COMMUNITY): Payer: Medicare Other

## 2013-12-22 DIAGNOSIS — F4323 Adjustment disorder with mixed anxiety and depressed mood: Secondary | ICD-10-CM | POA: Diagnosis not present

## 2013-12-22 DIAGNOSIS — Z7902 Long term (current) use of antithrombotics/antiplatelets: Secondary | ICD-10-CM | POA: Diagnosis not present

## 2013-12-22 DIAGNOSIS — M129 Arthropathy, unspecified: Secondary | ICD-10-CM | POA: Diagnosis not present

## 2013-12-22 DIAGNOSIS — F4325 Adjustment disorder with mixed disturbance of emotions and conduct: Secondary | ICD-10-CM | POA: Insufficient documentation

## 2013-12-22 DIAGNOSIS — Z7982 Long term (current) use of aspirin: Secondary | ICD-10-CM | POA: Insufficient documentation

## 2013-12-22 DIAGNOSIS — Z8669 Personal history of other diseases of the nervous system and sense organs: Secondary | ICD-10-CM | POA: Insufficient documentation

## 2013-12-22 DIAGNOSIS — R011 Cardiac murmur, unspecified: Secondary | ICD-10-CM | POA: Insufficient documentation

## 2013-12-22 DIAGNOSIS — I4891 Unspecified atrial fibrillation: Secondary | ICD-10-CM | POA: Diagnosis not present

## 2013-12-22 DIAGNOSIS — Z79899 Other long term (current) drug therapy: Secondary | ICD-10-CM | POA: Insufficient documentation

## 2013-12-22 DIAGNOSIS — R45851 Suicidal ideations: Secondary | ICD-10-CM | POA: Diagnosis not present

## 2013-12-22 DIAGNOSIS — D649 Anemia, unspecified: Secondary | ICD-10-CM | POA: Diagnosis not present

## 2013-12-22 DIAGNOSIS — F911 Conduct disorder, childhood-onset type: Secondary | ICD-10-CM | POA: Diagnosis not present

## 2013-12-22 DIAGNOSIS — F4329 Adjustment disorder with other symptoms: Secondary | ICD-10-CM

## 2013-12-22 LAB — CBC
HCT: 43.6 % (ref 36.0–46.0)
HEMOGLOBIN: 14.5 g/dL (ref 12.0–15.0)
MCH: 31.1 pg (ref 26.0–34.0)
MCHC: 33.3 g/dL (ref 30.0–36.0)
MCV: 93.6 fL (ref 78.0–100.0)
Platelets: 289 10*3/uL (ref 150–400)
RBC: 4.66 MIL/uL (ref 3.87–5.11)
RDW: 13.5 % (ref 11.5–15.5)
WBC: 9 10*3/uL (ref 4.0–10.5)

## 2013-12-22 NOTE — Telephone Encounter (Signed)
I need a 7 digit phone number

## 2013-12-22 NOTE — ED Notes (Signed)
X-ray at bedside

## 2013-12-22 NOTE — Telephone Encounter (Signed)
[phone]  Spoke to Mardene Celeste, pts daughter. Stated she has already spoke to Dr L, her mother refuses to come in to be seen.  Daughter was very appreciative of our efforts.

## 2013-12-22 NOTE — ED Notes (Signed)
Brought in by EMS from home for suicidal ideations.  Per [facility], pt's family reported that pt has been "verbally aggressive" for the past few days and tonight, she threatened to "kill herself tonight".  Pt has hx of cerebral hemorrhage--- had surgery last June 2014, underwent a short-term rehab.  Per family, she is diagnosed with "dementia" since that surgery.
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
## 2013-12-23 ENCOUNTER — Ambulatory Visit: Payer: Medicare Other | Admitting: Family Medicine

## 2013-12-23 DIAGNOSIS — F4323 Adjustment disorder with mixed anxiety and depressed mood: Secondary | ICD-10-CM

## 2013-12-23 DIAGNOSIS — R45851 Suicidal ideations: Secondary | ICD-10-CM | POA: Diagnosis not present

## 2013-12-23 LAB — RAPID URINE DRUG SCREEN, HOSP PERFORMED
AMPHETAMINES: NOT DETECTED
Barbiturates: NOT DETECTED
Benzodiazepines: NOT DETECTED
COCAINE: NOT DETECTED
Opiates: NOT DETECTED
TETRAHYDROCANNABINOL: NOT DETECTED

## 2013-12-23 LAB — COMPREHENSIVE METABOLIC PANEL
ALT: 7 U/L (ref 0–35)
AST: 16 U/L (ref 0–37)
Albumin: 3.6 g/dL (ref 3.5–5.2)
Alkaline Phosphatase: 80 U/L (ref 39–117)
BUN: 17 mg/dL (ref 6–23)
CO2: 24 meq/L (ref 19–32)
Calcium: 9.4 mg/dL (ref 8.4–10.5)
Chloride: 103 mEq/L (ref 96–112)
Creatinine, Ser: 1.27 mg/dL — ABNORMAL HIGH (ref 0.50–1.10)
GFR, EST AFRICAN AMERICAN: 42 mL/min — AB (ref >90)
GFR, EST NON AFRICAN AMERICAN: 36 mL/min — AB (ref >90)
GLUCOSE: 116 mg/dL — AB (ref 70–99)
POTASSIUM: 4.1 meq/L (ref 3.7–5.3)
SODIUM: 142 meq/L (ref 137–147)
Total Bilirubin: 0.4 mg/dL (ref 0.3–1.2)
Total Protein: 6.8 g/dL (ref 6.0–8.3)

## 2013-12-23 LAB — URINE MICROSCOPIC-ADD ON

## 2013-12-23 LAB — URINALYSIS, ROUTINE W REFLEX MICROSCOPIC
Glucose, UA: NEGATIVE mg/dL
HGB URINE DIPSTICK: NEGATIVE
Ketones, ur: NEGATIVE mg/dL
Nitrite: NEGATIVE
PROTEIN: 30 mg/dL — AB
Specific Gravity, Urine: 1.026 (ref 1.005–1.030)
UROBILINOGEN UA: 1 mg/dL (ref 0.0–1.0)
pH: 5 (ref 5.0–8.0)

## 2013-12-23 LAB — ACETAMINOPHEN LEVEL

## 2013-12-23 LAB — SALICYLATE LEVEL: Salicylate Lvl: <2 mg/dL — ABNORMAL LOW (ref 2.8–20.0)

## 2013-12-23 LAB — ETHANOL: Alcohol, Ethyl (B): <11 mg/dL (ref 0–11)

## 2013-12-23 MED ORDER — CLOPIDOGREL BISULFATE 75 MG PO TABS
75.0000 mg | ORAL_TABLET | Freq: Every day | ORAL | Status: DC
  Administered 2013-12-23: 75 mg via ORAL
  Filled 2013-12-23: qty 1

## 2013-12-23 MED ORDER — ZOLPIDEM TARTRATE 5 MG PO TABS: 5.0000 mg | ORAL_TABLET | Freq: Every evening | ORAL | Status: DC | PRN

## 2013-12-23 MED ORDER — ONDANSETRON HCL 4 MG PO TABS: 4.0000 mg | ORAL_TABLET | Freq: Three times a day (TID) | ORAL | Status: DC | PRN

## 2013-12-23 MED ORDER — DILTIAZEM HCL ER COATED BEADS 180 MG PO CP24
180.0000 mg | ORAL_CAPSULE | Freq: Every day | ORAL | Status: DC
  Filled 2013-12-23: qty 1

## 2013-12-23 MED ORDER — FERROUS FUMARATE 325 (106 FE) MG PO TABS: 1.0000 | ORAL_TABLET | Freq: Every morning | ORAL | Status: DC

## 2013-12-23 MED ORDER — CHOLECALCIFEROL 10 MCG (400 UNIT) PO TABS
400.0000 [IU] | ORAL_TABLET | Freq: Every day | ORAL | Status: DC
  Administered 2013-12-23: 400 [IU] via ORAL
  Filled 2013-12-23: qty 1

## 2013-12-23 MED ORDER — ADULT MULTIVITAMIN W/MINERALS CH
1.0000 | ORAL_TABLET | Freq: Every morning | ORAL | Status: DC
  Administered 2013-12-23: 1 via ORAL
  Filled 2013-12-23: qty 1

## 2013-12-23 MED ORDER — ACETAMINOPHEN 325 MG PO TABS: 650.0000 mg | ORAL_TABLET | ORAL | Status: DC | PRN

## 2013-12-23 MED ORDER — ASPIRIN EC 81 MG PO TBEC
81.0000 mg | DELAYED_RELEASE_TABLET | Freq: Every day | ORAL | Status: DC
  Administered 2013-12-23: 81 mg via ORAL
  Filled 2013-12-23: qty 1

## 2013-12-23 MED ORDER — LISINOPRIL 5 MG PO TABS
5.0000 mg | ORAL_TABLET | Freq: Every day | ORAL | Status: DC
  Administered 2013-12-23: 5 mg via ORAL
  Filled 2013-12-23: qty 1

## 2013-12-23 NOTE — [facility] Suicide Risk Assessment (Signed)
Suicide Risk Assessment  Discharge Assessment     Demographic Factors:  Age 80 or older and Caucasian  Total Time spent with patient: 1 hour  Psychiatric Specialty Exam:     Blood pressure 120/101, pulse 96, temperature 98.1 F (36.7 C), temperature source Oral, resp. rate 18, SpO2 96.00%.There is no weight on file to calculate BMI.  General Appearance: Casual  Eye Contact::  Good  Speech:  Clear and Coherent  Volume:  Normal  Mood:  Euthymic  Affect:  Appropriate  Thought Process:  Coherent and Logical  Orientation:  Full (Time, Place, and Person)  Thought Content:  Negative  Suicidal Thoughts:  No  Homicidal Thoughts:  No  Memory:  Immediate;   Good Recent;   Good Remote;   Good  Judgement:  Intact  Insight:  Good  Psychomotor Activity:  Normal  Concentration:  Good  Recall:  Good  Fund of Knowledge:Good  Language: Good  Akathisia:  Negative  Handed:  Right  AIMS (if indicated):     Assets:  Agricultural consultant Housing Intimacy Leisure Time Physical Health Resilience Social Support Talents/Skills Transportation Vocational/Educational  Sleep:       Musculoskeletal: Strength & Muscle Tone: within normal limits Gait & Station: normal Patient leans: N/A   Mental Status Per Nursing Assessment::   On Admission:     Current Mental Status by Physician: NA  Loss Factors: NA  Historical Factors: NA  Risk Reduction Factors:   Sense of responsibility to family, Living with another person, especially a relative, Positive social support and Positive coping skills or problem solving skills  Continued Clinical Symptoms:  none  Cognitive Features That Contribute To Risk:  Closed-mindedness    Suicide Risk:  Minimal: No identifiable suicidal ideation.  Patients presenting with no risk factors but with morbid ruminations; may be classified as minimal risk based on the severity of the depressive symptoms  Discharge  Diagnoses:   AXIS I:  Adjustment Disorder with Mixed Emotional Features AXIS II:  Deferred AXIS III:   Past Medical History  Diagnosis Date  . Low back pain   . Afib   . Arthritis   . Depression   . Anemia   . Cataract   . Blood transfusion without reported diagnosis   . Heart murmur    AXIS IV:  other psychosocial or environmental problems and problems related to social environment AXIS V:  51-60 moderate symptoms  Plan Of Care/Follow-up recommendations:  Activity:  resume usual activity Diet:  resume usual diet  Is patient on multiple antipsychotic therapies at discharge:  No   Has Patient had three or more failed trials of antipsychotic monotherapy by history:  No  Recommended Plan for Multiple Antipsychotic Therapies: NA    Mylee Falin D 12/23/2013, 1:28 PM
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
## 2013-12-23 NOTE — ED Notes (Signed)
Charge RN talked with [facility], waiting TTS, Dr Lovena Le to see pt this am
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
## 2013-12-23 NOTE — ED Notes (Signed)
1 pt belonging bag in locker #28 

## 2013-12-23 NOTE — Consult Note (Signed)
  Review of Systems  Constitutional: Negative.   HENT: Negative.   Eyes: Negative.   Respiratory: Negative.   Cardiovascular: Negative.   Gastrointestinal: Negative.   Genitourinary: Negative.   Musculoskeletal: Negative.   Skin: Negative.   Neurological: Negative.   Endo/Heme/Allergies: Negative.   Psychiatric/Behavioral: Negative.    Says as long as she takes her medication she is fine

## 2013-12-23 NOTE — Progress Notes (Signed)
Dr. Lovena Le rescinded the IVC
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
## 2013-12-29 ENCOUNTER — Encounter: Payer: Self-pay | Admitting: *Deleted

## 2013-12-29 ENCOUNTER — Telehealth: Payer: Self-pay | Admitting: Hematology & Oncology

## 2013-12-29 NOTE — Telephone Encounter (Signed)
Pt left message cx 3-25 appointment. I talked to her daughter who said pt was in a mood not to see doctor's right now. I left message with RN to triage

## 2013-12-29 NOTE — Progress Notes (Signed)
Family called and canceled pt's appt with MD.  States she does not want to see a doctor right now.  Will let Dr. Marin Olp know this when he is back in the office on 12/30/13

## 2014-01-05 ENCOUNTER — Ambulatory Visit: Payer: Medicare Other | Admitting: Hematology & Oncology

## 2014-01-05 ENCOUNTER — Other Ambulatory Visit: Payer: Medicare Other | Admitting: Lab

## 2014-01-20 ENCOUNTER — Encounter: Payer: Self-pay | Admitting: Family Medicine

## 2014-02-10 ENCOUNTER — Ambulatory Visit: Payer: Medicare Other | Admitting: Family Medicine

## 2014-04-06 DIAGNOSIS — M459 Ankylosing spondylitis of unspecified sites in spine: Secondary | ICD-10-CM | POA: Diagnosis not present

## 2014-04-06 DIAGNOSIS — M4310 Spondylolisthesis, site unspecified: Secondary | ICD-10-CM | POA: Diagnosis not present

## 2014-04-06 DIAGNOSIS — M48061 Spinal stenosis, lumbar region without neurogenic claudication: Secondary | ICD-10-CM | POA: Diagnosis not present

## 2014-04-06 DIAGNOSIS — Z79899 Other long term (current) drug therapy: Secondary | ICD-10-CM | POA: Diagnosis not present

## 2014-04-29 ENCOUNTER — Ambulatory Visit (INDEPENDENT_AMBULATORY_CARE_PROVIDER_SITE_OTHER): Payer: Medicare Other | Admitting: Family Medicine

## 2014-04-29 VITALS — BP 100/68 | HR 69 | Temp 97.7°F | Resp 16 | Wt 152.2 lb

## 2014-04-29 DIAGNOSIS — F410 Panic disorder [episodic paroxysmal anxiety] without agoraphobia: Secondary | ICD-10-CM

## 2014-04-29 DIAGNOSIS — F329 Major depressive disorder, single episode, unspecified: Secondary | ICD-10-CM | POA: Diagnosis not present

## 2014-04-29 DIAGNOSIS — F3289 Other specified depressive episodes: Secondary | ICD-10-CM

## 2014-04-29 DIAGNOSIS — F32A Depression, unspecified: Secondary | ICD-10-CM

## 2014-04-29 DIAGNOSIS — G894 Chronic pain syndrome: Secondary | ICD-10-CM

## 2014-04-29 MED ORDER — PAROXETINE HCL 20 MG PO TABS: 20.0000 mg | ORAL_TABLET | Freq: Every day | ORAL | Status: DC

## 2014-04-29 MED ORDER — ALPRAZOLAM 0.25 MG PO TABS: 0.2500 mg | ORAL_TABLET | Freq: Three times a day (TID) | ORAL | Status: DC | PRN

## 2014-04-29 NOTE — Progress Notes (Signed)
Subjective:    Patient ID: Diane Mejia, female    DOB: [birth year], 80 y.o.   MRN: [id number] This chart was scribed for Robyn Haber, MD by Terressa Koyanagi, ED Scribe at [facility]. This patient was seen in room Room 9 and the patient's care was started at 3:45 PM.    Patient ID: Diane Mejia MRN: [id number], DOB: 1923-07-26, 80 y.o. Date of Encounter: 04/29/2014, 3:45 PM  Primary Physician: Robyn Haber, MD  Chief Complaint: Depression  HPI: 80 y.o. year old female with history below presents for intermittent panic attacks onset a couple of weeks ago. Pt also complains of associated sleep disturbances and depression. Pt also reports she has chronic back pain. Pt reports she sees a pain specialist every month who has her on Oxycodone every 6 hours. Pt reports the Oxycodone is not very helpful. Pt reports that she has been taking her husband's Xanax following panic attacks with relief. Pt denies being on any antidepressants presently.   Past Medical History  Diagnosis Date  . Low back pain   . Afib   . Arthritis   . Depression   . Anemia   . Cataract   . Blood transfusion without reported diagnosis   . Heart murmur      Home Meds: Prior to Admission medications   Medication Sig Start Date End Date Taking? Authorizing Provider  aspirin EC 81 MG tablet Take 81 mg by mouth at bedtime.   Yes Historical Provider, MD  cholecalciferol (VITAMIN D) 400 UNITS TABS Take 400 Units by mouth daily.   Yes Historical Provider, MD  diltiazem (CARDIZEM CD) 180 MG 24 hr capsule Take 180 mg by mouth at bedtime.   Yes Historical Provider, MD  ferrous fumarate (HEMOCYTE - 106 MG FE) 325 (106 FE) MG TABS Take 1 tablet by mouth every morning.    Yes Historical Provider, MD  lisinopril (PRINIVIL,ZESTRIL) 2.5 MG tablet TAKE 1 TABLET (2.5 MG TOTAL) BY MOUTH DAILY. 10/03/13  Yes Robyn Haber, MD  Multiple Vitamin (MULTIVITAMIN) tablet Take 1 tablet by mouth every morning.    Yes  Historical Provider, MD    Allergies:  Allergies  Allergen Reactions  . Dilaudid [Hydromorphone Hcl] Itching  . Sulfa Antibiotics Hives, Itching and Rash    History   Social History  . Marital Status: Married    Spouse Name: N/A    Number of Children: N/A  . Years of Education: N/A   Occupational History  . Not on file.   Social History Main Topics  . Smoking status: Never Smoker   . Smokeless tobacco: Never Used  . Alcohol Use: No  . Drug Use: No  . Sexual Activity: No   Other Topics Concern  . Not on file   Social History Narrative  . No narrative on file     Review of Systems: Constitutional: negative for chills, fever, night sweats, weight changes, or fatigue  HEENT: negative for vision changes, hearing loss, congestion, rhinorrhea, ST, epistaxis, or sinus pressure Cardiovascular: negative for chest pain or palpitations Respiratory: negative for hemoptysis, wheezing, shortness of breath, or cough Abdominal: negative for abdominal pain, nausea, vomiting, diarrhea, or constipation Dermatological: negative for rash Neurologic: negative for headache, dizziness, or syncope All other systems reviewed and are otherwise negative with the exception to those above and in the HPI. Musc: Positive for chronic back pain  Psych: Positive for panic attacks, depression and sleep disturbances.   Physical Exam: Blood pressure 100/68, pulse  69, temperature 97.7 F (36.5 C), temperature source Oral, resp. rate 16, weight 152 lb 3.2 oz (69.037 kg), SpO2 96.00%., Body mass index is 27.62 kg/(m^2). General: Well developed, well nourished, in no acute distress. Head: Normocephalic, atraumatic, eyes without discharge, sclera non-icteric, nares are without discharge. Bilateral auditory canals clear, TM's are without perforation, pearly grey and translucent with reflective cone of light bilaterally. Oral cavity moist, posterior pharynx without exudate, erythema, peritonsillar abscess, or  post nasal drip.  Neck: Supple. No thyromegaly. Full ROM. No lymphadenopathy. Lungs: Clear bilaterally to auscultation without wheezes, rales, or rhonchi. Breathing is unlabored. Heart: RRR with S1 S2. No murmurs, rubs, or gallops appreciated. Abdomen: Soft, non-tender, non-distended with normoactive bowel sounds. No hepatomegaly. No rebound/guarding. No obvious abdominal masses. Msk:  Strength and tone normal for age. Extremities/Skin: Warm and dry. No clubbing or cyanosis. No edema. No rashes or suspicious lesions. Neuro: Alert and oriented X 3. Moves all extremities spontaneously. Gait is normal. CNII-XII grossly in tact. Psych:  Responds to questions appropriately with a normal affect.   I spent 30 minutes this space with patient discussing her chronic pain and chronic depression.   ASSESSMENT AND PLAN:  80 y.o. year old female with Panic disorder - Plan: ALPRAZolam (XANAX) 0.25 MG tablet, PARoxetine (PAXIL) 20 MG tablet  Depression - Plan: PARoxetine (PAXIL) 20 MG tablet  Chronic pain syndrome - Plan: PARoxetine (PAXIL) 20 MG tablet    Signed, Robyn Haber, MD 04/29/2014 3:45 PM     HPI    Review of Systems     Objective:   Physical Exam        Assessment & Plan:

## 2014-05-08 ENCOUNTER — Emergency Department (HOSPITAL_BASED_OUTPATIENT_CLINIC_OR_DEPARTMENT_OTHER): Payer: Medicare Other

## 2014-05-08 ENCOUNTER — Emergency Department (HOSPITAL_BASED_OUTPATIENT_CLINIC_OR_DEPARTMENT_OTHER)
Admission: EM | Admit: 2014-05-08 | Discharge: 2014-05-08 | Disposition: A | Discharge status: Home / Self Care | Payer: Medicare Other | Attending: Emergency Medicine | Admitting: Emergency Medicine

## 2014-05-08 ENCOUNTER — Encounter (HOSPITAL_BASED_OUTPATIENT_CLINIC_OR_DEPARTMENT_OTHER): Payer: Self-pay | Admitting: Emergency Medicine

## 2014-05-08 DIAGNOSIS — Z8669 Personal history of other diseases of the nervous system and sense organs: Secondary | ICD-10-CM | POA: Insufficient documentation

## 2014-05-08 DIAGNOSIS — R0602 Shortness of breath: Secondary | ICD-10-CM | POA: Diagnosis not present

## 2014-05-08 DIAGNOSIS — F3289 Other specified depressive episodes: Secondary | ICD-10-CM | POA: Insufficient documentation

## 2014-05-08 DIAGNOSIS — Z7982 Long term (current) use of aspirin: Secondary | ICD-10-CM | POA: Insufficient documentation

## 2014-05-08 DIAGNOSIS — I4819 Other persistent atrial fibrillation: Secondary | ICD-10-CM

## 2014-05-08 DIAGNOSIS — R011 Cardiac murmur, unspecified: Secondary | ICD-10-CM | POA: Diagnosis not present

## 2014-05-08 DIAGNOSIS — M25476 Effusion, unspecified foot: Secondary | ICD-10-CM | POA: Insufficient documentation

## 2014-05-08 DIAGNOSIS — F411 Generalized anxiety disorder: Secondary | ICD-10-CM | POA: Diagnosis not present

## 2014-05-08 DIAGNOSIS — I4891 Unspecified atrial fibrillation: Secondary | ICD-10-CM | POA: Diagnosis not present

## 2014-05-08 DIAGNOSIS — F329 Major depressive disorder, single episode, unspecified: Secondary | ICD-10-CM | POA: Diagnosis not present

## 2014-05-08 DIAGNOSIS — M25473 Effusion, unspecified ankle: Secondary | ICD-10-CM | POA: Insufficient documentation

## 2014-05-08 DIAGNOSIS — D649 Anemia, unspecified: Secondary | ICD-10-CM | POA: Diagnosis not present

## 2014-05-08 DIAGNOSIS — M129 Arthropathy, unspecified: Secondary | ICD-10-CM | POA: Insufficient documentation

## 2014-05-08 DIAGNOSIS — Z79899 Other long term (current) drug therapy: Secondary | ICD-10-CM | POA: Diagnosis not present

## 2014-05-08 LAB — COMPREHENSIVE METABOLIC PANEL
ALT: 8 U/L (ref 0–35)
AST: 15 U/L (ref 0–37)
Albumin: 3.8 g/dL (ref 3.5–5.2)
Alkaline Phosphatase: 69 U/L (ref 39–117)
Anion gap: 12 (ref 5–15)
BILIRUBIN TOTAL: 0.4 mg/dL (ref 0.3–1.2)
BUN: 15 mg/dL (ref 6–23)
CALCIUM: 9.8 mg/dL (ref 8.4–10.5)
CO2: 26 meq/L (ref 19–32)
CREATININE: 1.4 mg/dL — AB (ref 0.50–1.10)
Chloride: 103 mEq/L (ref 96–112)
GFR calc Af Amer: 37 mL/min — ABNORMAL LOW (ref >90)
GFR, EST NON AFRICAN AMERICAN: 32 mL/min — AB (ref >90)
Glucose, Bld: 140 mg/dL — ABNORMAL HIGH (ref 70–99)
Potassium: 5.1 mEq/L (ref 3.7–5.3)
Sodium: 141 mEq/L (ref 137–147)
Total Protein: 7.1 g/dL (ref 6.0–8.3)

## 2014-05-08 LAB — CBC WITH DIFFERENTIAL/PLATELET
Basophils Absolute: 0 10*3/uL (ref 0.0–0.1)
Basophils Relative: 1 % (ref 0–1)
EOS PCT: 9 % — AB (ref 0–5)
Eosinophils Absolute: 0.6 10*3/uL (ref 0.0–0.7)
HEMATOCRIT: 40.5 % (ref 36.0–46.0)
Hemoglobin: 13 g/dL (ref 12.0–15.0)
LYMPHS ABS: 1.7 10*3/uL (ref 0.7–4.0)
LYMPHS PCT: 26 % (ref 12–46)
MCH: 30.7 pg (ref 26.0–34.0)
MCHC: 32.1 g/dL (ref 30.0–36.0)
MCV: 95.5 fL (ref 78.0–100.0)
MONO ABS: 0.5 10*3/uL (ref 0.1–1.0)
Monocytes Relative: 7 % (ref 3–12)
Neutro Abs: 3.7 10*3/uL (ref 1.7–7.7)
Neutrophils Relative %: 58 % (ref 43–77)
Platelets: 322 10*3/uL (ref 150–400)
RBC: 4.24 MIL/uL (ref 3.87–5.11)
RDW: 14.1 % (ref 11.5–15.5)
WBC: 6.5 10*3/uL (ref 4.0–10.5)

## 2014-05-08 LAB — PRO B NATRIURETIC PEPTIDE: PRO B NATRI PEPTIDE: 4565 pg/mL — AB (ref 0–450)

## 2014-05-08 LAB — TROPONIN I

## 2014-05-08 MED ORDER — METOPROLOL TARTRATE 12.5 MG HALF TABLET
12.5000 mg | ORAL_TABLET | Freq: Once | ORAL | Status: DC
  Filled 2014-05-08: qty 1

## 2014-05-08 MED ORDER — FUROSEMIDE 40 MG PO TABS
40.0000 mg | ORAL_TABLET | Freq: Once | ORAL | Status: AC
Start: 2014-05-08 — End: 2014-05-08
  Administered 2014-05-08: 40 mg via ORAL
  Filled 2014-05-08: qty 1

## 2014-05-08 NOTE — Discharge Instructions (Signed)
Atrial Fibrillation °Atrial fibrillation is a type of irregular heart rhythm (arrhythmia). During atrial fibrillation, the upper chambers of the heart (atria) quiver continuously in a chaotic pattern. This causes an irregular and often rapid heart rate.  °Atrial fibrillation is the result of the heart becoming overloaded with disorganized signals that tell it to beat. These signals are normally released one at a time by a part of the right atrium called the sinoatrial node. They then travel from the atria to the lower chambers of the heart (ventricles), causing the atria and ventricles to contract and pump blood as they pass. In atrial fibrillation, parts of the atria outside of the sinoatrial node also release these signals. This results in two problems. First, the atria receive so many signals that they do not have time to fully contract. Second, the ventricles, which can only receive one signal at a time, beat irregularly and out of rhythm with the atria.  °There are three types of atrial fibrillation:  °· Paroxysmal. Paroxysmal atrial fibrillation starts suddenly and stops on its own within a week. °· Persistent. Persistent atrial fibrillation lasts for more than a week. It may stop on its own or with treatment. °· Permanent. Permanent atrial fibrillation does not go away. Episodes of atrial fibrillation may lead to permanent atrial fibrillation. °Atrial fibrillation can prevent your heart from pumping blood normally. It increases your risk of stroke and can lead to heart failure.  °CAUSES  °· Heart conditions, including a heart attack, heart failure, coronary artery disease, and heart valve conditions.   °· Inflammation of the sac that surrounds the heart (pericarditis). °· Blockage of an artery in the lungs (pulmonary embolism). °· Pneumonia or other infections. °· Chronic lung disease. °· Thyroid problems, especially if the thyroid is overactive (hyperthyroidism). °· Caffeine, excessive alcohol use, and use  of some illegal drugs.   °· Use of some medicines, including certain decongestants and diet pills. °· Heart surgery.   °· Birth defects.   °Sometimes, no cause can be found. When this happens, the atrial fibrillation is called lone atrial fibrillation. The risk of complications from atrial fibrillation increases if you have lone atrial fibrillation and you are age 60 years or older. °RISK FACTORS °· Heart failure. °· Coronary artery disease. °· Diabetes mellitus.   °· High blood pressure (hypertension).   °· Obesity.   °· Other arrhythmias.   °· Increased age. °SIGNS AND SYMPTOMS  °· A feeling that your heart is beating rapidly or irregularly.   °· A feeling of discomfort or pain in your chest.   °· Shortness of breath.   °· Sudden light-headedness or weakness.   °· Getting tired easily when exercising.   °· Urinating more often than normal (mainly when atrial fibrillation first begins).   °In paroxysmal atrial fibrillation, symptoms may start and suddenly stop. °DIAGNOSIS  °Your health care provider may be able to detect atrial fibrillation when taking your pulse. Your health care provider may have you take a test called an ambulatory electrocardiogram (ECG). An ECG records your heartbeat patterns over a 24-hour period. You may also have other tests, such as: °· Transthoracic echocardiogram (TTE). During echocardiography, sound waves are used to evaluate how blood flows through your heart. °· Transesophageal echocardiogram (TEE). °· Stress test. There is more than one type of stress test. If a stress test is needed, ask your health care provider about which type is best for you. °· Chest X-ray exam. °· Blood tests. °· Computed tomography (CT). °TREATMENT  °Treatment may include: °· Treating any underlying conditions. For example, if you   have an overactive thyroid, treating the condition may correct atrial fibrillation.  Taking medicine. Medicines may be given to control a rapid heart rate or to prevent blood  clots, heart failure, or a stroke.  Having a procedure to correct the rhythm of the heart:  Electrical cardioversion. During electrical cardioversion, a controlled, low-energy shock is delivered to the heart through your skin. If you have chest pain, very low blood pressure, or sudden heart failure, this procedure may need to be done as an emergency.  Catheter ablation. During this procedure, heart tissues that send the signals that cause atrial fibrillation are destroyed.  Surgical ablation. During this surgery, thin lines of heart tissue that carry the abnormal signals are destroyed. This procedure can either be an open-heart surgery or a minimally invasive surgery. With the minimally invasive surgery, small cuts are made to access the heart instead of a large opening.  Pulmonary venous isolation. During this surgery, tissue around the veins that carry blood from the lungs (pulmonary veins) is destroyed. This tissue is thought to carry the abnormal signals. HOME CARE INSTRUCTIONS   Take medicines only as directed by your health care provider. Some medicines can make atrial fibrillation worse or recur.  If blood thinners were prescribed by your health care provider, take them exactly as directed. Too much blood-thinning medicine can cause bleeding. If you take too little, you will not have the needed protection against stroke and other problems.  Perform blood tests at home if directed by your health care provider. Perform blood tests exactly as directed.  Quit smoking if you smoke.  Do not drink alcohol.  Do not drink caffeinated beverages such as coffee, soda, and some teas. You may drink decaffeinated coffee, soda, or tea.   Maintain a healthy weight.Do not use diet pills unless your health care provider approves. They may make heart problems worse.   Follow diet instructions as directed by your health care provider.  Exercise regularly as directed by your health care  provider.  Keep all follow-up visits as directed by your health care provider. This is important. PREVENTION  The following substances can cause atrial fibrillation to recur:   Caffeinated beverages.  Alcohol.  Certain medicines, especially those used for breathing problems.  Certain herbs and herbal medicines, such as those containing ephedra or ginseng.  Illegal drugs, such as cocaine and amphetamines. Sometimes medicines are given to prevent atrial fibrillation from recurring. Proper treatment of any underlying condition is also important in helping prevent recurrence.  SEEK MEDICAL CARE IF:  You notice a change in the rate, rhythm, or strength of your heartbeat.  You suddenly begin urinating more frequently.  You tire more easily when exerting yourself or exercising. SEEK IMMEDIATE MEDICAL CARE IF:   You have chest pain, abdominal pain, sweating, or weakness.  You feel nauseous.  You have shortness of breath.  You suddenly have swollen feet and ankles.  You feel dizzy.  Your face or limbs feel numb or weak.  You have a change in your vision or speech. MAKE SURE YOU:   Understand these instructions.  Will watch your condition.  Will get help right away if you are not doing well or get worse. Document Released: 09/30/2005 Document Revised: 02/14/2014 Document Reviewed: 11/10/2012 [facility] Patient Information 2015 [place], [place]. This information is not intended to replace advice given to you by your health care provider. Make sure you discuss any questions you have with your health care provider.  Shortness of Breath Shortness of  breath means you have trouble breathing. It could also mean that you have a medical problem. You should get immediate medical care for shortness of breath. CAUSES   Not enough oxygen in the air such as with high altitudes or a smoke-filled room.  Certain lung diseases, infections, or problems.  Heart disease or conditions, such as  angina or heart failure.  Low red blood cells (anemia).  Poor physical fitness, which can cause shortness of breath when you exercise.  Chest or back injuries or stiffness.  Being overweight.  Smoking.  Anxiety, which can make you feel like you are not getting enough air. DIAGNOSIS  Serious medical problems can often be found during your physical exam. Tests may also be done to determine why you are having shortness of breath. Tests may include:  Chest X-rays.  Lung function tests.  Blood tests.  An electrocardiogram (ECG).  An ambulatory electrocardiogram. An ambulatory ECG records your heartbeat patterns over a 24-hour period.  Exercise testing.  A transthoracic echocardiogram (TTE). During echocardiography, sound waves are used to evaluate how blood flows through your heart.  A transesophageal echocardiogram (TEE).  Imaging scans. Your health care provider may not be able to find a cause for your shortness of breath after your exam. In this case, it is important to have a follow-up exam with your health care provider as directed.  TREATMENT  Treatment for shortness of breath depends on the cause of your symptoms and can vary greatly. HOME CARE INSTRUCTIONS   Do not smoke. Smoking is a common cause of shortness of breath. If you smoke, ask for help to quit.  Avoid being around chemicals or things that may bother your breathing, such as paint fumes and dust.  Rest as needed. Slowly resume your usual activities.  If medicines were prescribed, take them as directed for the full length of time directed. This includes oxygen and any inhaled medicines.  Keep all follow-up appointments as directed by your health care provider. SEEK MEDICAL CARE IF:   Your condition does not improve in the time expected.  You have a hard time doing your normal activities even with rest.  You have any new symptoms. SEEK IMMEDIATE MEDICAL CARE IF:   Your shortness of breath gets  worse.  You feel light-headed, faint, or develop a cough not controlled with medicines.  You start coughing up blood.  You have pain with breathing.  You have chest pain or pain in your arms, shoulders, or abdomen.  You have a fever.  You are unable to walk up stairs or exercise the way you normally do. MAKE SURE YOU:  Understand these instructions.  Will watch your condition.  Will get help right away if you are not doing well or get worse. Document Released: 06/25/2001 Document Revised: 10/05/2013 Document Reviewed: 12/16/2011 [facility] Patient Information 2015 [place], [place]. This information is not intended to replace advice given to you by your health care provider. Make sure you discuss any questions you have with your health care provider.

## 2014-05-08 NOTE — ED Notes (Signed)
Patient states she saw MD and was prescribed xanax .25mg  for anxiety/panic attacks and took one this morning and another at 3:30 pm when she started experiencing SOB, She sates she has been under a lot of stress, she is the caregiver for her husband
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
## 2014-05-08 NOTE — ED Notes (Signed)
Pt sts she became short of breath apprx. 1.5 hours ago. Pt is unsure if she is having dyspnea or a panic attack.

## 2014-05-08 NOTE — ED Notes (Signed)
Resting HR 80s, SPO2 97% on RA. After Ambulating one lap in the ED approx. 300 feet HR increased to 140s and Spo2 94%.   No distress nor increase WOB noted. Pt states "I feel better than when I first got here." Placed pt back in bed. No complaints noted.

## 2014-05-09 DIAGNOSIS — M4310 Spondylolisthesis, site unspecified: Secondary | ICD-10-CM | POA: Diagnosis not present

## 2014-05-09 DIAGNOSIS — Z79899 Other long term (current) drug therapy: Secondary | ICD-10-CM | POA: Diagnosis not present

## 2014-05-09 DIAGNOSIS — M48061 Spinal stenosis, lumbar region without neurogenic claudication: Secondary | ICD-10-CM | POA: Diagnosis not present

## 2014-05-10 DIAGNOSIS — D649 Anemia, unspecified: Secondary | ICD-10-CM | POA: Diagnosis not present

## 2014-05-10 DIAGNOSIS — I509 Heart failure, unspecified: Secondary | ICD-10-CM | POA: Diagnosis not present

## 2014-05-10 DIAGNOSIS — I959 Hypotension, unspecified: Secondary | ICD-10-CM | POA: Diagnosis not present

## 2014-05-10 DIAGNOSIS — R002 Palpitations: Secondary | ICD-10-CM | POA: Diagnosis not present

## 2014-05-10 DIAGNOSIS — F430 Acute stress reaction: Secondary | ICD-10-CM | POA: Diagnosis not present

## 2014-05-10 DIAGNOSIS — I4891 Unspecified atrial fibrillation: Secondary | ICD-10-CM | POA: Diagnosis not present

## 2014-05-14 ENCOUNTER — Emergency Department (HOSPITAL_BASED_OUTPATIENT_CLINIC_OR_DEPARTMENT_OTHER)
Admission: EM | Admit: 2014-05-14 | Discharge: 2014-05-14 | Disposition: A | Discharge status: Home / Self Care | Payer: Medicare Other | Attending: Emergency Medicine | Admitting: Emergency Medicine

## 2014-05-14 ENCOUNTER — Encounter (HOSPITAL_BASED_OUTPATIENT_CLINIC_OR_DEPARTMENT_OTHER): Payer: Self-pay | Admitting: Emergency Medicine

## 2014-05-14 DIAGNOSIS — Z8669 Personal history of other diseases of the nervous system and sense organs: Secondary | ICD-10-CM | POA: Diagnosis not present

## 2014-05-14 DIAGNOSIS — D649 Anemia, unspecified: Secondary | ICD-10-CM | POA: Diagnosis not present

## 2014-05-14 DIAGNOSIS — R011 Cardiac murmur, unspecified: Secondary | ICD-10-CM | POA: Insufficient documentation

## 2014-05-14 DIAGNOSIS — M129 Arthropathy, unspecified: Secondary | ICD-10-CM | POA: Diagnosis not present

## 2014-05-14 DIAGNOSIS — M545 Low back pain, unspecified: Secondary | ICD-10-CM | POA: Diagnosis not present

## 2014-05-14 DIAGNOSIS — F329 Major depressive disorder, single episode, unspecified: Secondary | ICD-10-CM | POA: Insufficient documentation

## 2014-05-14 DIAGNOSIS — F411 Generalized anxiety disorder: Secondary | ICD-10-CM | POA: Diagnosis not present

## 2014-05-14 DIAGNOSIS — F3289 Other specified depressive episodes: Secondary | ICD-10-CM | POA: Insufficient documentation

## 2014-05-14 DIAGNOSIS — I4891 Unspecified atrial fibrillation: Secondary | ICD-10-CM | POA: Insufficient documentation

## 2014-05-14 DIAGNOSIS — Z79899 Other long term (current) drug therapy: Secondary | ICD-10-CM | POA: Diagnosis not present

## 2014-05-14 DIAGNOSIS — F419 Anxiety disorder, unspecified: Secondary | ICD-10-CM

## 2014-05-14 NOTE — ED Notes (Signed)
Pt reports was seen for same recently.  States she is having anxiety, sporadic episode of sob with anxiety.  Not currently noted, speaking in full sentences without difficulty.

## 2014-05-14 NOTE — Discharge Instructions (Signed)
Panic Attacks °Panic attacks are sudden, short feelings of great fear or discomfort. You may have them for no reason when you are relaxed, when you are uneasy (anxious), or when you are sleeping.  °HOME CARE °· Take all your medicines as told. °· Check with your doctor before starting new medicines. °· Keep all doctor visits. °GET HELP IF: °· You are not able to take your medicines as told. °· Your symptoms do not get better. °· Your symptoms get worse. °GET HELP RIGHT AWAY IF: °· Your attacks seem different than your normal attacks. °· You have thoughts about hurting yourself or others. °· You take panic attack medicine and you have a side effect. °MAKE SURE YOU: °· Understand these instructions. °· Will watch your condition. °· Will get help right away if you are not doing well or get worse. °Document Released: 11/02/2010 Document Revised: 07/21/2013 Document Reviewed: 05/14/2013 °ExitCare® Patient Information ©2015 [employer]. This information is not intended to replace advice given to you by your health care provider. Make sure you discuss any questions you have with your health care provider. ° ° °Emergency Department Resource Guide °1) Find a Doctor and Pay Out of Pocket °Although you won't have to find out who is covered by your insurance plan, it is a good idea to ask around and get recommendations. You will then need to call the office and see if the doctor you have chosen will accept you as a new patient and what types of options they offer for patients who are self-pay. Some doctors offer discounts or will set up payment plans for their patients who do not have insurance, but you will need to ask so you aren't surprised when you get to your appointment. ° °2) Contact Your Local Health Department °Not all health departments have doctors that can see patients for sick visits, but many do, so it is worth a call to see if yours does. If you don't know where your local health department is, you can check in  your phone book. The CDC also has a tool to help you locate your state's health department, and many state websites also have listings of all of their local health departments. ° °3) Find a Walk-in Clinic °If your illness is not likely to be very severe or complicated, you may want to try a walk in clinic. These are popping up all over the country in pharmacies, drugstores, and shopping centers. They're usually staffed by nurse practitioners or physician assistants that have been trained to treat common illnesses and complaints. They're usually fairly quick and inexpensive. However, if you have serious medical issues or chronic medical problems, these are probably not your best option. ° °No Primary Care Doctor: °- Call [facility] at  832-8000 - they can help you locate a primary care doctor that  accepts your insurance, provides certain services, etc. °- Physician Referral Service- 1-800-533-3463 ° °Chronic Pain Problems: °Organization         Address  Phone   Notes  °[facility]  (336) 297-2271 Patients need to be referred by their primary care doctor.  ° °Medication Assistance: °Organization         Address  Phone   Notes  °[place] [facility] [address] °[place], [place] [address] (336) 641-8030 --Must be a resident of [place] °-- Must have NO insurance coverage whatsoever (no Medicaid/ Medicare, etc.) °-- The pt. MUST have a primary care doctor   that directs their care regularly and follows them in the community °  °[facility]  (866) 331-1348   °[employer]  (888) 892-1162   ° °Agencies that provide inexpensive medical care: °Organization         Address  Phone   Notes  °[facility]  (336) 832-8035   °[facility]    (336) 832-7272   °[facility] [address] °[place], [place] [address] (336) 832-4777   °[facility] [address], °[place] (336) 271-4999    °[facility]    (336) 373-0678   °[facility]    (336) 272-1050   °[facility] ° [address], [place] Phone:  (336) 832-4444, Fax:  (336) 832-4440 Hours of Operation:  9 am - 6 pm, M-F.  Also accepts Medicaid/Medicare and self-pay.  °[facility] ° [address], [place] Phone: (336) 832-3150, Fax: (336) 832-3151. Hours of Operation:  8:30 am - 5:30 pm, M-F.  Also accepts Medicaid and self-pay.  °[facility] [place] [address], [place] Phone: (336) 878-6027   °[facility] [address], [place], [place] (336)723-1848, Ext. 123 Mondays & Thursdays: 7-9 AM.  First 15 patients are seen on a first come, first serve basis. °  ° °Medicaid-accepting [place] Providers: ° °Organization         Address  Phone   Notes  °[facility] [address], [place] (336) 641-2100 Also accepts self-pay patients.  °[facility] [address], [place] ° (336) 856-9996   °[facility] [address], [place] (336) 288-8857   °[facility] [address] [place] [address], [place] (336) 299-7000   °Veita Bland [address], [place]  ° (336) 373-1557 Only accepts [place] Access Medicaid patients after they have their name applied to their card.  ° °Self-Pay (no insurance) in [place]: ° °Organization         Address  Phone   Notes  °Sickle Cell Patients, [facility] [address], [place] (336) 832-1970   °[facility] [address], [place] (336) 832-4400   ° [facility] ° [address], [place] (336) 992-4800   °[facility]/Dr. Osei-Bonsu ° [address] [place] [address], [place] or [address], [place] (336) 841-8500 Phone number for both [place] and [place] locations is the  same.  °[facility] [address], [place] (336) 299-0000   °[facility] [address] [place] [address], [place] or [address] (336) 852-7530 °(336) 878-2260   °[facility] [address], [place] (336) 350-1642, phone; (336) 294-5005, fax Sees patients 1st and 3rd Saturday of every month.  Must not qualify for public or private insurance (i.e. Medicaid, Medicare, [place] Health Choice, Veterans' Benefits) • Household income should be no more than 200% of the poverty level •The clinic cannot treat you if you are pregnant or think you are pregnant • Sexually transmitted diseases are not treated at the clinic.  ° ° °Dental Care: °Organization         Address  Phone  Notes  °[facility] [address], [place] (336) 641-6152 Accepts children   up to age 21 who are enrolled in Medicaid or [place] Health Choice; pregnant women with a Medicaid card; and children who have applied for Medicaid or [place] Health Choice, but were declined, whose parents can pay a reduced fee at time of service.  °[facility] [place]  [address], [place] (336) 641-7733 Accepts children up to age 21 who are enrolled in Medicaid or [place] Health Choice; pregnant women with a Medicaid card; and children who have applied for Medicaid or [place] Health Choice, but were declined, whose parents can pay a reduced fee at time of service.  °[facility] ° [address], [place] (336) 641-4533 Patients are seen by appointment only. Walk-ins are not accepted. [facility] will see patients 18 years of age and older. °Monday - Tuesday (8am-5pm) °Most Wednesdays (8:30-5pm) °$30 per visit, cash only  °[facility] ° [address], [place] (336) 641-4533 Patients are seen by appointment only. Walk-ins are not accepted. [facility] will see patients  18 years of age and older. °One Wednesday Evening (Monthly: Volunteer Based).  $30 per visit, cash only  °[facility]  (919) 537-3737 for adults; Children under age 4, call Graduate Pediatric Dentistry at (919) [phone]. Children aged 4-14, please call (919) [phone] to request a pediatric application. ° Dental services are provided in all areas of dental care including fillings, crowns and bridges, complete and partial dentures, implants, gum treatment, root canals, and extractions. Preventive care is also provided. Treatment is provided to both adults and children. °Patients are selected via a lottery and there is often a waiting list. °  °[facility] [address], °[place] ° (336) 763-8833 [email or web address] °  °[facility] [address], [place], [place] (336)723-1848, Ext. 123 Second and Fourth Thursday of each month, opens at 6:30 AM; Clinic ends at 9 AM.  Patients are seen on a first-come first-served basis, and a limited number are seen during each clinic.  ° °[facility] ° [address], [place], [place] (336) 723-7904   Eligibility Requirements °You must have lived in [place], [place], or [place] counties for at least the last three months. °  You cannot be eligible for state or federal sponsored healthcare insurance, including Veterans Administration, Medicaid, or Medicare. °  You generally cannot be eligible for healthcare insurance through your employer.  °  How to apply: °Eligibility screenings are held every Tuesday and Wednesday afternoon from 1:00 pm until 4:00 pm. You do not need an appointment for the interview!  °[facility] [address], [place], [place] 336-631-2330   °[facility]  336-342-8273   °[facility]  336-703-3100   °[facility]  336-570-6415   ° °Behavioral Health Resources in the Community: °Intensive Outpatient  Programs °Organization         Address  Phone  Notes  °[facility] [address], [place], [place] 336-878-6098   °[facility] [address], [place], [place] 336-832-9800   °[facility] [address], [place], [place] ° 336-882-2125   °[facility] [address],  °[place], [place] 1-800-853-5163 or 336-641-4981   °Substance Abuse Resources °Organization         Address  Phone  Notes  °[facility]  336-882-2125   °[facility]  336-784-9470   °[facility]  336-285-9073   °[facility]  336-845-3988   °Residential & Outpatient Substance Abuse Program  1-800-659-3381   °Psychological Services °Organization         Address  Phone  Notes  °[facility]  336- 832-9600   °[facility]  336- 378-7881   °[facility] [address], [place] 1-800-853-5163 or 336-641-4981   ° °Mobile Crisis Teams °Organization         Address  Phone  Notes  °[facility], Mobile Crisis Care Unit  1-877-626-1772   °Assertive °[facility] ° [address] [place], [place] 336-834-9664   °Sharon DeEsch [address] °[place] 336-554-5454   ° °Self-Help/Support Groups °Organization         Address  Phone             Notes  °[facility] - variety of support groups  336- 373-1402 Call for more information  °Narcotics Anonymous (NA), [facility] [address], °[place]  2 meetings at this location  ° °Residential Treatment Programs °Organization         Address  Phone  Notes  °[facility] [address],    °[place]  1-866-801-8205   °[facility] ° [address], [place], [place] 704-293-8524   °[facility] [address], [place] 336-845-3988 Admissions: 8am-3pm M-F  °[facility] [address]    °[place], [place]  336-841-1104   °[facility] [address], [place], [place] 336-379-7146   °[facility] [address]  °[place], [place] 336-285-9073   °[facility] - Intensive Outpatient [address], , [place] 336-852-3033   °[facility] [address]  °[place], [place] 1-877-615-2722 or 336-784-9470   °[facility] [address] [place], [place] 336-227-7417 Accepts Medicaid  °[facility] [address]  ° [place] 1-800-659-3381 Substance Abuse/Addiction Treatment  ° °[place] Behavioral Health Resources °Organization         Address  Phone  Notes  °[facility]  (888) 581-9988   °Julie Brannon, PhD [address] [place], [place]   (336) 349-5553 or (336) 951-0000   °[facility]   [address] °[place], [place] (336) 349-4454   °[facility] [address], [place], [place] (336) 342-8316 Insurance/Medicaid/sponsorship through [facility]  °[facility] [address]                                    [place], [place] (336) 342-8316 Therapy/tele-psych/case  °[facility] [address]  ° [place], [place] (336) 349-2233    °Dr. Arfeen  (336) 349-4544   °[facility] [place]  [employer] [place] [facility] 1) [address], [place] °2) [address], [place] °3)  [address], [place] (336) 349-3220 °(336) 342-7768 ° °(336) 342-8140   °[place] [facility] (336) 342-1394 or (336) 342-3537 (After Hours)    ° ° ° °
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
## 2014-05-14 NOTE — ED Notes (Signed)
Unable to obtain UA - EDP aware.

## 2014-05-15 ENCOUNTER — Encounter (HOSPITAL_BASED_OUTPATIENT_CLINIC_OR_DEPARTMENT_OTHER): Payer: Self-pay | Admitting: Emergency Medicine

## 2014-05-22 DIAGNOSIS — F410 Panic disorder [episodic paroxysmal anxiety] without agoraphobia: Secondary | ICD-10-CM | POA: Diagnosis not present

## 2014-05-22 DIAGNOSIS — R4589 Other symptoms and signs involving emotional state: Secondary | ICD-10-CM | POA: Diagnosis not present

## 2014-05-22 DIAGNOSIS — I4891 Unspecified atrial fibrillation: Secondary | ICD-10-CM | POA: Diagnosis not present

## 2014-06-14 ENCOUNTER — Encounter: Payer: Self-pay | Admitting: Family Medicine

## 2014-06-14 DIAGNOSIS — G894 Chronic pain syndrome: Secondary | ICD-10-CM | POA: Insufficient documentation

## 2014-06-18 DIAGNOSIS — H9190 Unspecified hearing loss, unspecified ear: Secondary | ICD-10-CM | POA: Diagnosis not present

## 2014-08-05 ENCOUNTER — Encounter (HOSPITAL_COMMUNITY): Payer: Self-pay | Admitting: Emergency Medicine

## 2014-08-05 DIAGNOSIS — Z8669 Personal history of other diseases of the nervous system and sense organs: Secondary | ICD-10-CM | POA: Diagnosis not present

## 2014-08-05 DIAGNOSIS — D649 Anemia, unspecified: Secondary | ICD-10-CM | POA: Diagnosis not present

## 2014-08-05 DIAGNOSIS — R5383 Other fatigue: Secondary | ICD-10-CM | POA: Diagnosis not present

## 2014-08-05 DIAGNOSIS — Z9889 Other specified postprocedural states: Secondary | ICD-10-CM | POA: Diagnosis not present

## 2014-08-05 DIAGNOSIS — E860 Dehydration: Secondary | ICD-10-CM | POA: Diagnosis not present

## 2014-08-05 DIAGNOSIS — R531 Weakness: Secondary | ICD-10-CM | POA: Diagnosis not present

## 2014-08-05 DIAGNOSIS — I482 Chronic atrial fibrillation: Secondary | ICD-10-CM | POA: Insufficient documentation

## 2014-08-05 DIAGNOSIS — Z8659 Personal history of other mental and behavioral disorders: Secondary | ICD-10-CM | POA: Diagnosis not present

## 2014-08-05 DIAGNOSIS — Z79899 Other long term (current) drug therapy: Secondary | ICD-10-CM | POA: Diagnosis not present

## 2014-08-05 DIAGNOSIS — R011 Cardiac murmur, unspecified: Secondary | ICD-10-CM | POA: Insufficient documentation

## 2014-08-05 DIAGNOSIS — Z7902 Long term (current) use of antithrombotics/antiplatelets: Secondary | ICD-10-CM | POA: Diagnosis not present

## 2014-08-05 DIAGNOSIS — R42 Dizziness and giddiness: Secondary | ICD-10-CM | POA: Diagnosis present

## 2014-08-05 DIAGNOSIS — M1990 Unspecified osteoarthritis, unspecified site: Secondary | ICD-10-CM | POA: Diagnosis not present

## 2014-08-05 DIAGNOSIS — N179 Acute kidney failure, unspecified: Secondary | ICD-10-CM | POA: Insufficient documentation

## 2014-08-05 DIAGNOSIS — Z862 Personal history of diseases of the blood and blood-forming organs and certain disorders involving the immune mechanism: Secondary | ICD-10-CM | POA: Insufficient documentation

## 2014-08-05 DIAGNOSIS — Z7982 Long term (current) use of aspirin: Secondary | ICD-10-CM | POA: Diagnosis not present

## 2014-08-05 LAB — CBC WITH DIFFERENTIAL/PLATELET
BASOS PCT: 1 % (ref 0–1)
Basophils Absolute: 0 10*3/uL (ref 0.0–0.1)
Eosinophils Absolute: 0.5 10*3/uL (ref 0.0–0.7)
Eosinophils Relative: 8 % — ABNORMAL HIGH (ref 0–5)
HEMATOCRIT: 41.7 % (ref 36.0–46.0)
Hemoglobin: 14.2 g/dL (ref 12.0–15.0)
Lymphocytes Relative: 24 % (ref 12–46)
Lymphs Abs: 1.7 10*3/uL (ref 0.7–4.0)
MCH: 30.6 pg (ref 26.0–34.0)
MCHC: 34.1 g/dL (ref 30.0–36.0)
MCV: 89.9 fL (ref 78.0–100.0)
Monocytes Absolute: 0.5 10*3/uL (ref 0.1–1.0)
Monocytes Relative: 7 % (ref 3–12)
NEUTROS ABS: 4.3 10*3/uL (ref 1.7–7.7)
Neutrophils Relative %: 62 % (ref 43–77)
PLATELETS: 273 10*3/uL (ref 150–400)
RBC: 4.64 MIL/uL (ref 3.87–5.11)
RDW: 13.8 % (ref 11.5–15.5)
WBC: 7 10*3/uL (ref 4.0–10.5)

## 2014-08-05 LAB — PROTIME-INR
INR: 0.97 (ref 0.00–1.49)
PROTHROMBIN TIME: 13 s (ref 11.6–15.2)

## 2014-08-05 LAB — CBG MONITORING, ED: Glucose-Capillary: 131 mg/dL — ABNORMAL HIGH (ref 70–99)

## 2014-08-05 NOTE — ED Notes (Signed)
Pt c/o weakness, dizziness, and lightheadedness for the past week. Pt states every time she gets up to walk or move he feels weak and dizzy. Pt denies symptoms while sitting. Pt also reports nausea, decreased appetite, and difficulty voiding. Husband gave pt a lasix pill this morning to help void. Pt also reports taking half a xanax prior to arrival to ED for anxiety.

## 2014-08-06 ENCOUNTER — Emergency Department (HOSPITAL_COMMUNITY)
Admission: EM | Admit: 2014-08-06 | Discharge: 2014-08-06 | Disposition: A | Discharge status: Home / Self Care | Payer: Medicare Other | Attending: Emergency Medicine | Admitting: Emergency Medicine

## 2014-08-06 DIAGNOSIS — I482 Chronic atrial fibrillation, unspecified: Secondary | ICD-10-CM

## 2014-08-06 DIAGNOSIS — E860 Dehydration: Secondary | ICD-10-CM

## 2014-08-06 DIAGNOSIS — R531 Weakness: Secondary | ICD-10-CM

## 2014-08-06 DIAGNOSIS — N179 Acute kidney failure, unspecified: Secondary | ICD-10-CM

## 2014-08-06 DIAGNOSIS — R5383 Other fatigue: Secondary | ICD-10-CM

## 2014-08-06 LAB — COMPREHENSIVE METABOLIC PANEL
ALBUMIN: 3.7 g/dL (ref 3.5–5.2)
ALT: 7 U/L (ref 0–35)
ANION GAP: 13 (ref 5–15)
AST: 14 U/L (ref 0–37)
Alkaline Phosphatase: 68 U/L (ref 39–117)
BILIRUBIN TOTAL: 0.5 mg/dL (ref 0.3–1.2)
BUN: 30 mg/dL — ABNORMAL HIGH (ref 6–23)
CO2: 24 mEq/L (ref 19–32)
CREATININE: 1.58 mg/dL — AB (ref 0.50–1.10)
Calcium: 9.8 mg/dL (ref 8.4–10.5)
Chloride: 96 mEq/L (ref 96–112)
GFR calc non Af Amer: 28 mL/min — ABNORMAL LOW (ref >90)
GFR, EST AFRICAN AMERICAN: 32 mL/min — AB (ref >90)
GLUCOSE: 122 mg/dL — AB (ref 70–99)
Potassium: 5.6 mEq/L — ABNORMAL HIGH (ref 3.7–5.3)
Sodium: 133 mEq/L — ABNORMAL LOW (ref 137–147)
Total Protein: 6.8 g/dL (ref 6.0–8.3)

## 2014-08-06 MED ORDER — SODIUM CHLORIDE 0.9 % IV BOLUS (SEPSIS): 500.0000 mL | Freq: Once | INTRAVENOUS | Status: DC
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
## 2014-08-06 NOTE — ED Notes (Signed)
The patient is unable to give an urine specimen at this time. The tech has reported to the RN in charge. 

## 2014-08-06 NOTE — Discharge Instructions (Signed)
Stay well hydrated, make sure you have your urine checked for infection.  If you were given medicines take as directed.  If you are on coumadin or contraceptives realize their levels and effectiveness is altered by many different medicines.  If you have any reaction (rash, tongues swelling, other) to the medicines stop taking and see a physician.   Please follow up as directed and return to the ER or see a physician for new or worsening symptoms.  Thank you. Filed Vitals:   08/05/14 2221 08/06/14 0030 08/06/14 0045  BP: 122/66 108/46 103/69  Pulse: 79 89 72  Temp: 97.9 F (36.6 C)    Resp: 16 12 14   SpO2: 95% 99% 100%

## 2014-08-23 ENCOUNTER — Other Ambulatory Visit: Payer: Self-pay | Admitting: Physician Assistant

## 2014-08-30 ENCOUNTER — Ambulatory Visit: Payer: Medicare Other | Admitting: Internal Medicine

## 2014-09-06 DIAGNOSIS — G894 Chronic pain syndrome: Secondary | ICD-10-CM | POA: Diagnosis not present

## 2014-09-06 DIAGNOSIS — M4806 Spinal stenosis, lumbar region: Secondary | ICD-10-CM | POA: Diagnosis not present

## 2014-09-06 DIAGNOSIS — Z79891 Long term (current) use of opiate analgesic: Secondary | ICD-10-CM | POA: Diagnosis not present

## 2014-09-06 DIAGNOSIS — M4316 Spondylolisthesis, lumbar region: Secondary | ICD-10-CM | POA: Diagnosis not present

## 2014-10-20 ENCOUNTER — Ambulatory Visit (INDEPENDENT_AMBULATORY_CARE_PROVIDER_SITE_OTHER): Payer: Medicare Other | Admitting: Family Medicine

## 2014-10-20 ENCOUNTER — Encounter: Payer: Self-pay | Admitting: Family Medicine

## 2014-10-20 VITALS — BP 100/56 | HR 77 | Temp 98.3°F | Resp 16 | Ht 61.5 in | Wt 138.0 lb

## 2014-10-20 DIAGNOSIS — F329 Major depressive disorder, single episode, unspecified: Secondary | ICD-10-CM | POA: Diagnosis not present

## 2014-10-20 DIAGNOSIS — Z23 Encounter for immunization: Secondary | ICD-10-CM | POA: Diagnosis not present

## 2014-10-20 DIAGNOSIS — I481 Persistent atrial fibrillation: Secondary | ICD-10-CM

## 2014-10-20 DIAGNOSIS — I4819 Other persistent atrial fibrillation: Secondary | ICD-10-CM

## 2014-10-20 DIAGNOSIS — F32A Depression, unspecified: Secondary | ICD-10-CM

## 2014-10-20 LAB — COMPREHENSIVE METABOLIC PANEL
ALT: <8 U/L (ref 0–35)
AST: 15 U/L (ref 0–37)
Albumin: 3.9 g/dL (ref 3.5–5.2)
Alkaline Phosphatase: 62 U/L (ref 39–117)
BUN: 26 mg/dL — ABNORMAL HIGH (ref 6–23)
CO2: 31 mEq/L (ref 19–32)
Calcium: 9.9 mg/dL (ref 8.4–10.5)
Chloride: 100 mEq/L (ref 96–112)
Creat: 1.26 mg/dL — ABNORMAL HIGH (ref 0.50–1.10)
Glucose, Bld: 102 mg/dL — ABNORMAL HIGH (ref 70–99)
Potassium: 5.1 mEq/L (ref 3.5–5.3)
Sodium: 140 mEq/L (ref 135–145)
Total Bilirubin: 0.4 mg/dL (ref 0.2–1.2)
Total Protein: 6.8 g/dL (ref 6.0–8.3)

## 2014-10-20 MED ORDER — FLUOXETINE HCL (PMDD) 10 MG PO TABS: 10.0000 mg | ORAL_TABLET | Freq: Every day | ORAL | Status: DC

## 2014-10-20 NOTE — Progress Notes (Signed)
   Subjective:    Patient ID: Diane Mejia, female    DOB: 1923-09-14, 81 y.o.   MRN: [id number] This chart was scribed for Robyn Haber, MD by Zola Button, Medical Scribe. This patient was seen in Room 23 and the patient's care was started at 4:08 PM.   HPI HPI Comments: Diane Mejia is a 81 y.o. female with a hx of A Fib, chronic pain syndrome, anxiety and depression who presents to the [facility] for a medication refill. She wants a prescription for depression. Patient has not been sleeping well; she takes a Xanax at night to help her sleep. She has also had difficulty voiding; she states she might not drink enough water. Patient denies swelling in her feet. Patient was on Wellbutrin and Lexapro in the past without much help.  Patient spent the holidays with family.  Review of Systems  Cardiovascular: Negative for leg swelling.  Genitourinary: Positive for difficulty urinating.  Musculoskeletal: Positive for back pain.  Psychiatric/Behavioral: Positive for sleep disturbance and dysphoric mood.       Objective:   Physical Exam CONSTITUTIONAL:  HEAD: Normocephalic/atraumatic EYES: EOM/PERRL ENMT: Mucous membranes moist NECK: supple no meningeal signs SPINE: entire spine nontender CV: Irregular heart rate. Atrial fibrillation. LUNGS: Lungs are clear to auscultation bilaterally, no apparent distress ABDOMEN: soft, nontender, no rebound or guarding GU: no cva tenderness NEURO: Pt is awake/alert, moves all extremitiesx4 EXTREMITIES: pulses normal, full ROM SKIN: warm, color normal PSYCH: good eye contact, alert, but clearly discouraged with husband's dialysis and her own chronic pain        Assessment & Plan:   This chart was scribed in my presence and reviewed by me personally.    ICD-9-CM ICD-10-CM   1. Need for prophylactic vaccination and inoculation against influenza V04.81 Z23 Flu Vaccine QUAD 36+ mos IM  2. Depression 311 F32.9 Fluoxetine HCl,  PMDD, 10 MG TABS  3. Persistent atrial fibrillation 427.31 I48.1 CBC with Differential     Comprehensive metabolic panel     Signed, Robyn Haber, MD

## 2014-10-21 ENCOUNTER — Encounter: Payer: Self-pay | Admitting: *Deleted

## 2014-10-21 LAB — CBC WITH DIFFERENTIAL/PLATELET
Basophils Absolute: 0.1 10*3/uL (ref 0.0–0.1)
Basophils Relative: 1 % (ref 0–1)
Eosinophils Absolute: 0.4 10*3/uL (ref 0.0–0.7)
Eosinophils Relative: 6 % — ABNORMAL HIGH (ref 0–5)
HCT: 40.5 % (ref 36.0–46.0)
Hemoglobin: 13.3 g/dL (ref 12.0–15.0)
Lymphocytes Relative: 20 % (ref 12–46)
Lymphs Abs: 1.5 10*3/uL (ref 0.7–4.0)
MCH: 30.9 pg (ref 26.0–34.0)
MCHC: 32.8 g/dL (ref 30.0–36.0)
MCV: 94 fL (ref 78.0–100.0)
MPV: 10.8 fL (ref 8.6–12.4)
Monocytes Absolute: 0.5 10*3/uL (ref 0.1–1.0)
Monocytes Relative: 7 % (ref 3–12)
Neutro Abs: 4.8 10*3/uL (ref 1.7–7.7)
Neutrophils Relative %: 66 % (ref 43–77)
Platelets: 361 10*3/uL (ref 150–400)
RBC: 4.31 MIL/uL (ref 3.87–5.11)
RDW: 13.6 % (ref 11.5–15.5)
WBC: 7.3 10*3/uL (ref 4.0–10.5)

## 2014-10-27 ENCOUNTER — Telehealth: Payer: Self-pay

## 2014-10-27 NOTE — Telephone Encounter (Signed)
Please instruct family to take patient to [facility] for psych evaluation
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
## 2014-10-28 NOTE — Telephone Encounter (Signed)
Threasa Beards spoke with pt's son yesterday and advised for them to take pt to [facility]

## 2014-10-30 IMAGING — CT CT CERVICAL SPINE W/O CM
2 of 5 series · 4 of 14 positions shown, 5 images · non-contrast
Comparison: None

CT HEAD

CLINICAL DATA: Trauma

CT HEAD WITHOUT CONTRAST
CT MAXILLOFACIAL WITHOUT CONTRAST
CT CERVICAL SPINE WITHOUT CONTRAST
TECHNIQUE: Multidetector CT imaging of the head, cervical spine,
and maxillofacial structures were performed using the standard
protocol without intravenous contrast. Multiplanar CT image
reconstructions of the cervical spine and maxillofacial structures
were also generated.

[Series 6: c-spine st · axial · 0.28mm/px · z∈[+1578,+1632]mm · 2 of 81 slices shown, 3 images]
[im 27/81  soft-tissue]
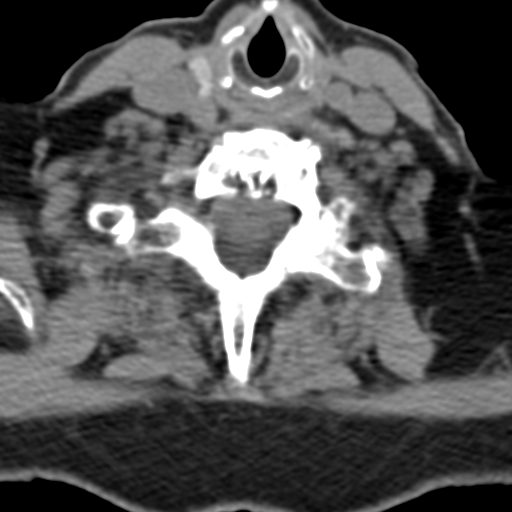
[im 27/81  bone]
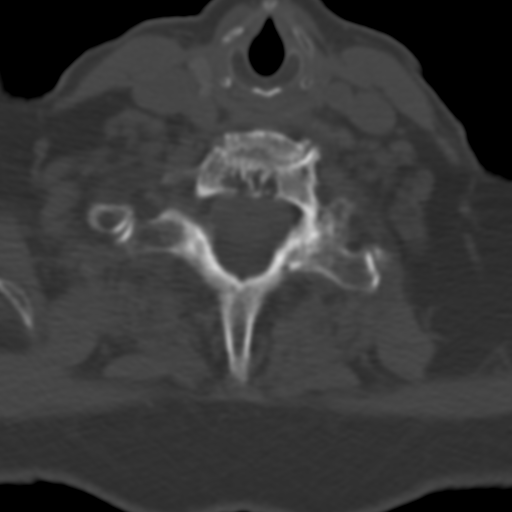
[im 54/81  bone]
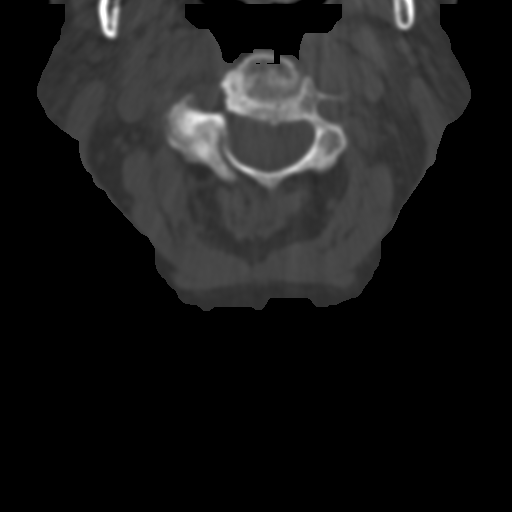

[Series 15: axial · axial · 0.23mm/px · z∈[+1559,+1607]mm · 2 of 80 slices shown]
[im 27/80  bone]
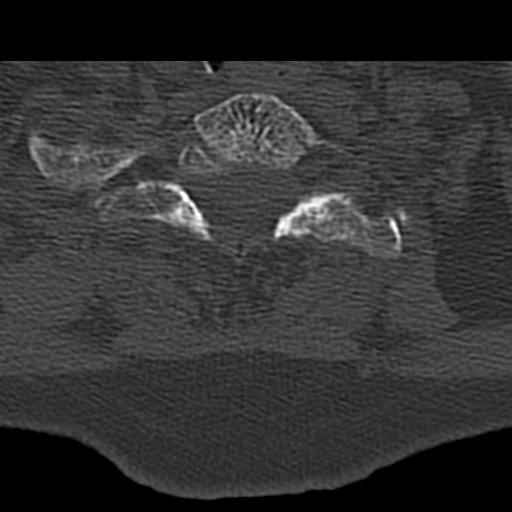
[im 53/80  bone]
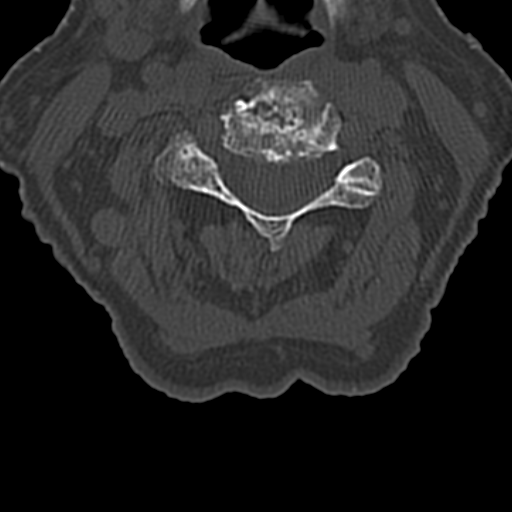

[4 of 14 positions shown; findings below may reference images not displayed]

FINDINGS: Right frontal temporal subdural hematoma measures 5 mm.
Small interhemispheric subdural hematoma is present posteriorly.
There is blood layering along the tentorium compatible with
subdural hematoma.

Generalized atrophy with chronic microvascular ischemia.  No acute
infarct or mass.  No significant midline shift.  Negative for skull
fracture.
IMPRESSION: Right frontal temporal subdural hematoma.  Interhemispheric
subdural hematoma.  Right tentorial subdural hematoma.  No
significant midline shift.

Atrophy and chronic microvascular ischemia.

CT MAXILLOFACIAL
FINDINGS: Negative for facial fracture.  Negative for fracture of
the mandible or orbit.  Paranasal sinuses are clear.  Extensive
dermal calcification is noted due to chronic infection.
Degenerative change in the temporal mandibular joint bilaterally.
IMPRESSION: Negative for facial fracture.

CT CERVICAL SPINE
FINDINGS: Cervical levoscoliosis.  Negative for fracture

Extensive disc degeneration and spondylosis throughout the cervical
spine.  There is facet degeneration, most prominent on the right
side.  Mild spinal stenosis at L4-5 and L5-S1 due to spurring.
IMPRESSION: Advanced cervical degenerative change.  Negative for cervical
fracture.

## 2014-10-31 IMAGING — CT CT HEAD W/O CM
1 of 2 series · 15 of 30 positions shown, 19 images · non-contrast
Comparison: 03/26/2013

CLINICAL DATA: Follow-up subdural hemorrhage.

CT HEAD WITHOUT CONTRAST
TECHNIQUE: Contiguous axial images were obtained from the base of
the skull through the vertex without contrast.

[Series 3: recon 2: brain · axial · 0.44mm/px · z∈[+95,+241]mm · 15 of 64 slices shown, 19 images]
[im 4/64  brain]
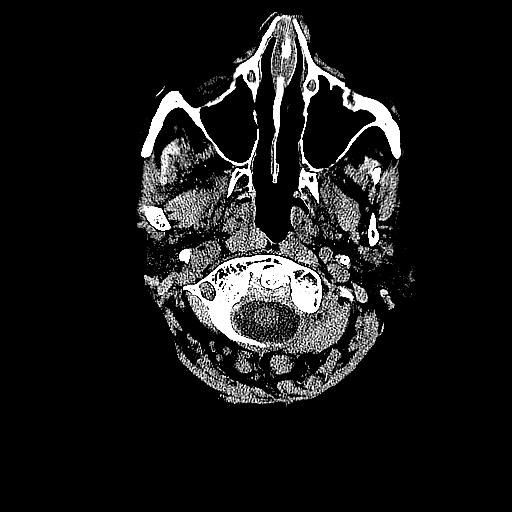
[im 4/64  bone]
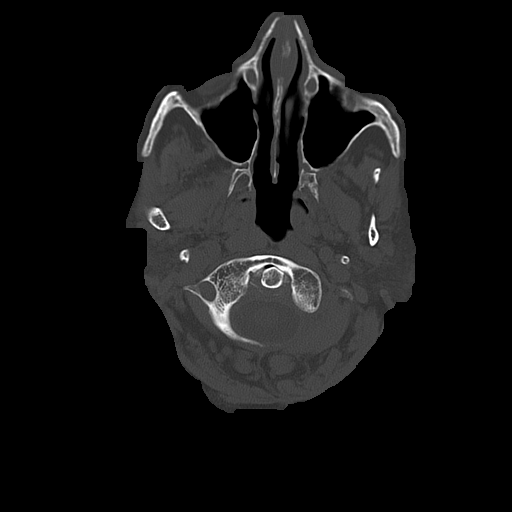
[im 7/64  brain]
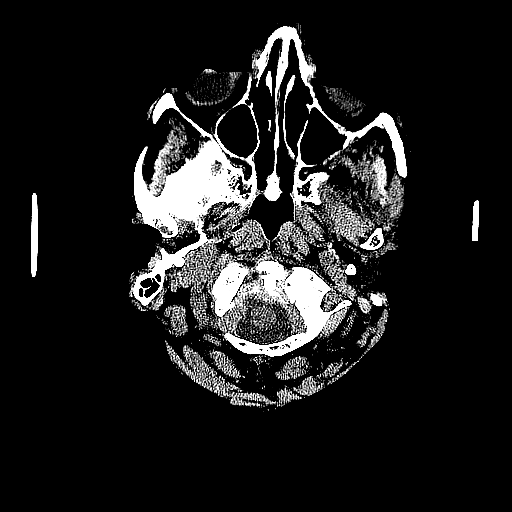
[im 14/64  brain]
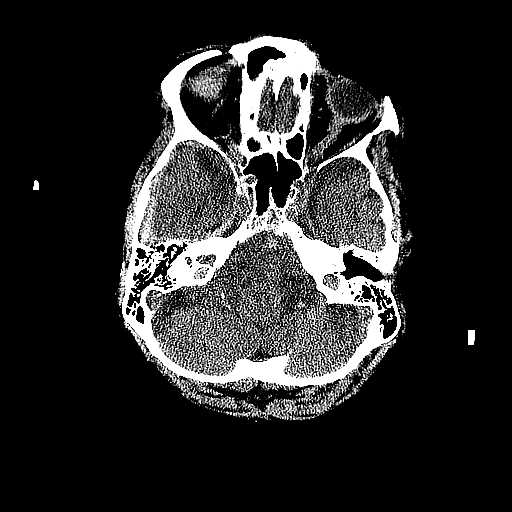
[im 17/64  brain]
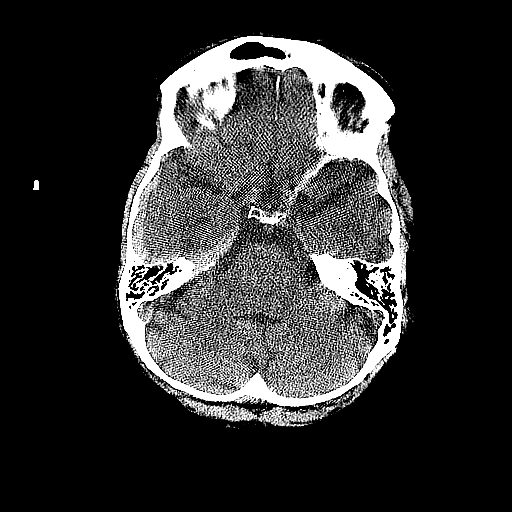
[im 20/64  brain]
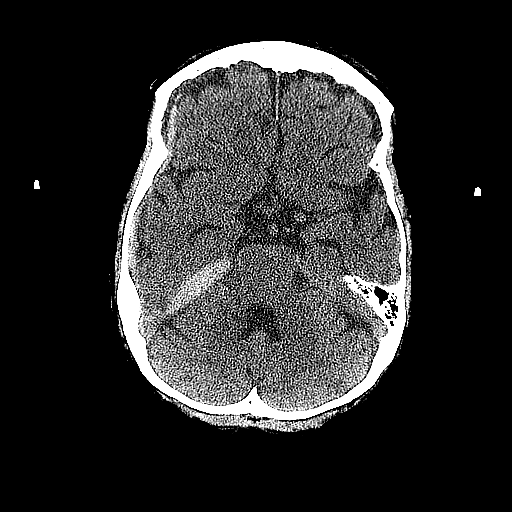
[im 20/64  bone]
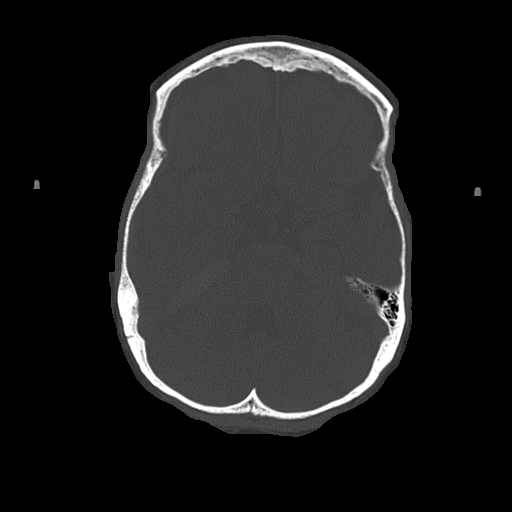
[im 24/64  brain]
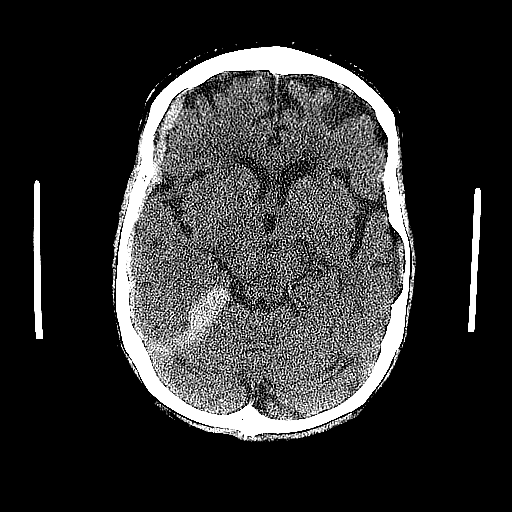
[im 27/64  brain]
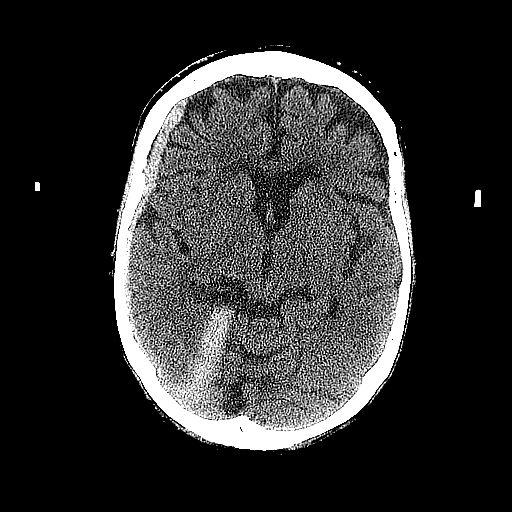
[im 34/64  brain]
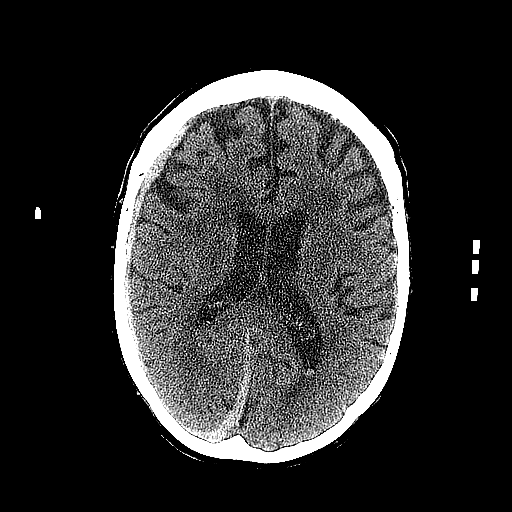
[im 37/64  brain]
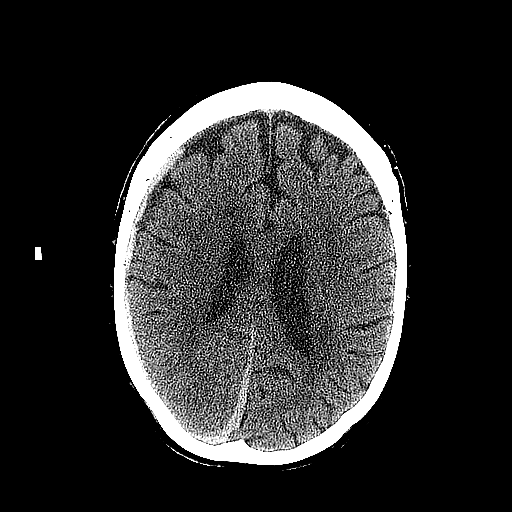
[im 37/64  bone]
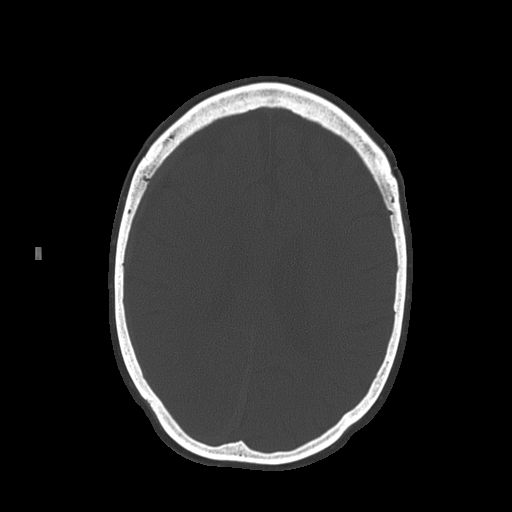
[im 40/64  brain]
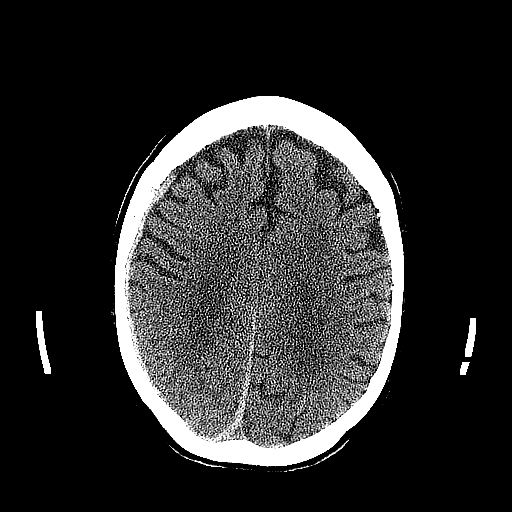
[im 44/64  brain]
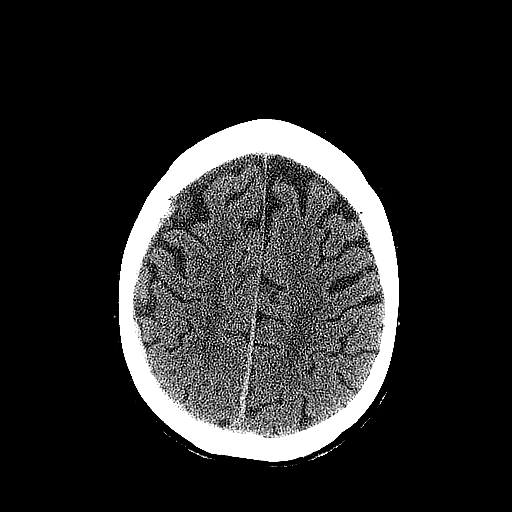
[im 47/64  brain]
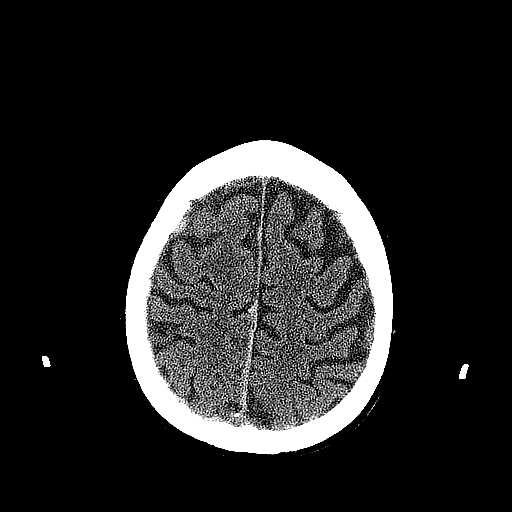
[im 54/64  brain]
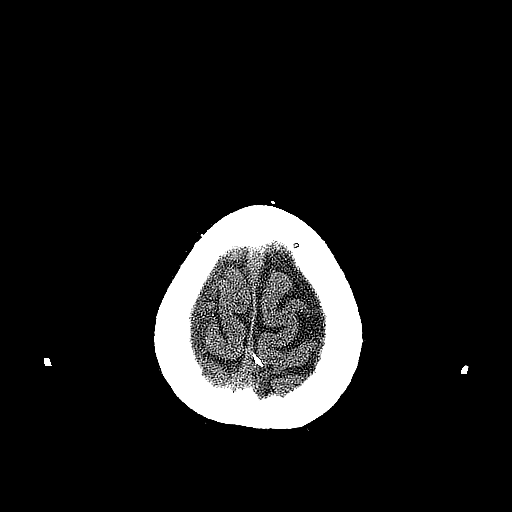
[im 54/64  bone]
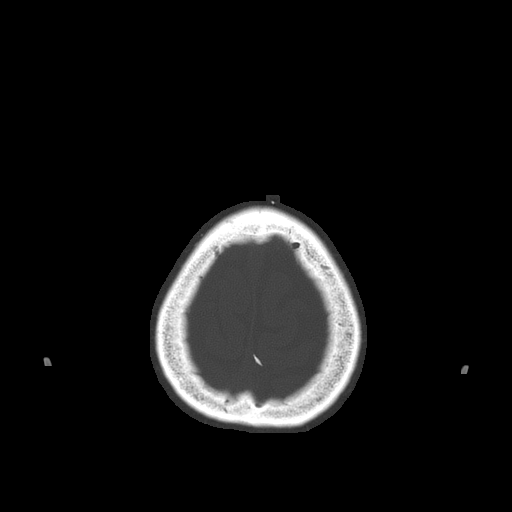
[im 57/64  brain]
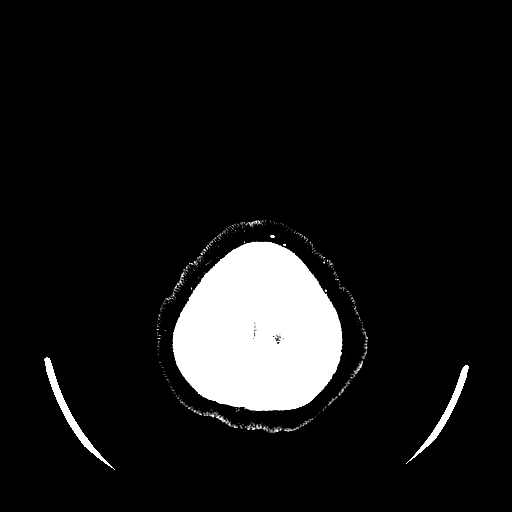
[im 60/64  brain]
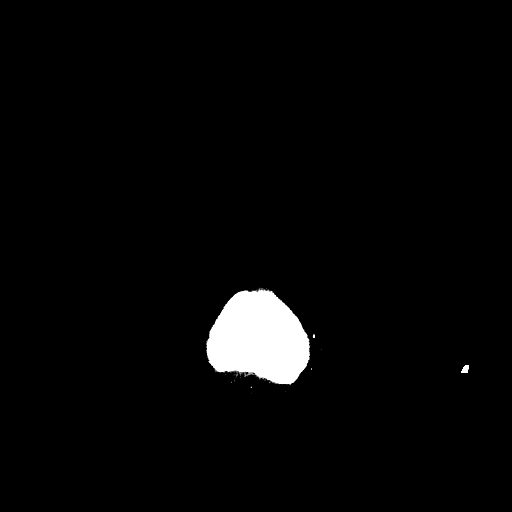

[15 of 30 positions shown; findings below may reference images not displayed]

FINDINGS: The subdural hemorrhage along the right frontal lobe
roughly measures 5 mm in thickness and this is unchanged.  Again
noted is subdural hemorrhage in the right temporal extra-axial
space.  Again noted is blood along the right side of the tentorium
which has minimally changed.  The blood along the right side of the
posterior falx is denser but not significantly changed for size.
There is no significant midline shift.  Again noted is diffuse low
density throughout the subcortical and periventricular white matter
suggesting chronic changes.  Stable size and appearance of the
ventricles and basal cisterns.  There is now a small focus of blood
within the left lateral ventricle.  No acute bony abnormality.
IMPRESSION: No significant change in the distribution or amount of subdural
blood.

There is now a small focus of blood in the left lateral ventricle.

Evidence for chronic small vessel ischemic changes

## 2014-11-10 ENCOUNTER — Ambulatory Visit: Payer: Medicare Other | Admitting: Family Medicine

## 2014-11-23 ENCOUNTER — Telehealth: Payer: Self-pay

## 2014-11-23 DIAGNOSIS — N6320 Unspecified lump in the left breast, unspecified quadrant: Secondary | ICD-10-CM

## 2014-11-23 NOTE — Telephone Encounter (Signed)
Called both home and cell numbers. Unable to leave voicemail. I spoke with Dr. Everlene Farrier verbally and he recommends her to come to our office first for evaluation.

## 2014-11-23 NOTE — Telephone Encounter (Signed)
Pt is needing to talk with someone about scheduling a mammogram early at [facility] she believes she has found a mass

## 2014-11-24 NOTE — Telephone Encounter (Signed)
Pt CB and she stated that [facility] had said she could just call the doctor and have him call them and set up the MM test. She would rather not come in first to see [place] because she and her husband both have to use walkers and it is "such an ordeal" to come in. She asked that I send message back to Dr Everlene Farrier and ask if he would just send in the order for the test first.

## 2014-11-25 NOTE — Telephone Encounter (Signed)
Please go ahead and schedule a mammogram. It needs to be diagnostic in the breast that she felt the mass

## 2014-11-25 NOTE — Telephone Encounter (Signed)
Referral placed and pt notified

## 2014-12-16 DIAGNOSIS — N6002 Solitary cyst of left breast: Secondary | ICD-10-CM | POA: Diagnosis not present

## 2014-12-16 DIAGNOSIS — N63 Unspecified lump in breast: Secondary | ICD-10-CM | POA: Diagnosis not present

## 2015-01-05 ENCOUNTER — Telehealth: Payer: Self-pay

## 2015-01-05 ENCOUNTER — Other Ambulatory Visit: Payer: Self-pay | Admitting: Family Medicine

## 2015-01-05 DIAGNOSIS — R0600 Dyspnea, unspecified: Secondary | ICD-10-CM

## 2015-01-05 MED ORDER — ALBUTEROL SULFATE HFA 108 (90 BASE) MCG/ACT IN AERS: 2.0000 | INHALATION_SPRAY | Freq: Four times a day (QID) | RESPIRATORY_TRACT | Status: DC | PRN

## 2015-01-05 NOTE — Telephone Encounter (Signed)
Pt needs an inhaler called in for her. She had a lot of trouble sleeping last night. Please advise at [phone]

## 2015-01-05 NOTE — Telephone Encounter (Signed)
I called in an inhaler.  Shortness of breath is nothing to fool around with.  Have her come in ASAP

## 2015-01-05 NOTE — Telephone Encounter (Signed)
Patient called back because she hasn't heard from anyone. She states that she is having trouble breathing. For that reason this message will be marked as high priority.

## 2015-01-06 NOTE — Telephone Encounter (Signed)
Gave pt message that we called in inhaler. She will not come in today. Says she will come in later. I stressed to her the urgency of coming in for SOB.

## 2015-01-09 ENCOUNTER — Encounter: Payer: Self-pay | Admitting: Family Medicine

## 2015-01-12 DIAGNOSIS — Z79891 Long term (current) use of opiate analgesic: Secondary | ICD-10-CM | POA: Diagnosis not present

## 2015-01-12 DIAGNOSIS — G894 Chronic pain syndrome: Secondary | ICD-10-CM | POA: Diagnosis not present

## 2015-01-12 DIAGNOSIS — M4806 Spinal stenosis, lumbar region: Secondary | ICD-10-CM | POA: Diagnosis not present

## 2015-01-12 DIAGNOSIS — M5136 Other intervertebral disc degeneration, lumbar region: Secondary | ICD-10-CM | POA: Diagnosis not present

## 2015-02-03 ENCOUNTER — Other Ambulatory Visit: Payer: Self-pay | Admitting: Family Medicine

## 2015-02-04 ENCOUNTER — Other Ambulatory Visit: Payer: Self-pay | Admitting: Family Medicine

## 2015-02-06 NOTE — Telephone Encounter (Signed)
Faxed

## 2015-02-08 ENCOUNTER — Ambulatory Visit (INDEPENDENT_AMBULATORY_CARE_PROVIDER_SITE_OTHER): Payer: Medicare Other | Admitting: Family Medicine

## 2015-02-08 VITALS — BP 102/60 | HR 125 | Temp 97.5°F | Resp 17 | Ht 61.5 in | Wt 167.0 lb

## 2015-02-08 DIAGNOSIS — F329 Major depressive disorder, single episode, unspecified: Secondary | ICD-10-CM

## 2015-02-08 DIAGNOSIS — F411 Generalized anxiety disorder: Secondary | ICD-10-CM

## 2015-02-08 DIAGNOSIS — I482 Chronic atrial fibrillation, unspecified: Secondary | ICD-10-CM

## 2015-02-08 DIAGNOSIS — F32A Depression, unspecified: Secondary | ICD-10-CM

## 2015-02-08 MED ORDER — ALPRAZOLAM 0.25 MG PO TABS: ORAL_TABLET | ORAL | Status: DC

## 2015-02-08 MED ORDER — FLUOXETINE HCL (PMDD) 10 MG PO TABS: 10.0000 mg | ORAL_TABLET | Freq: Every day | ORAL | Status: DC

## 2015-02-08 MED ORDER — DILTIAZEM HCL ER COATED BEADS 120 MG PO CP24: 120.0000 mg | ORAL_CAPSULE | Freq: Every day | ORAL | Status: DC
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
## 2015-02-09 ENCOUNTER — Telehealth: Payer: Self-pay

## 2015-02-09 DIAGNOSIS — I482 Chronic atrial fibrillation, unspecified: Secondary | ICD-10-CM

## 2015-02-09 DIAGNOSIS — I5022 Chronic systolic (congestive) heart failure: Secondary | ICD-10-CM

## 2015-02-09 NOTE — Telephone Encounter (Signed)
Patient saw Dr. Joseph Art yesterday.  Daughter wants to know if her mother is in congestive heart failure and should she contact a cardiologist.    Faythe Ghee to leave a message on her voice mail, if she does not answer.   [phone]

## 2015-02-09 NOTE — Telephone Encounter (Signed)
Please advise. This was not diagnosed at the last office visit.

## 2015-02-13 ENCOUNTER — Telehealth: Payer: Self-pay | Admitting: Family Medicine

## 2015-02-13 DIAGNOSIS — N179 Acute kidney failure, unspecified: Secondary | ICD-10-CM | POA: Diagnosis present

## 2015-02-13 DIAGNOSIS — I5043 Acute on chronic combined systolic (congestive) and diastolic (congestive) heart failure: Secondary | ICD-10-CM | POA: Diagnosis present

## 2015-02-13 DIAGNOSIS — Z7902 Long term (current) use of antithrombotics/antiplatelets: Secondary | ICD-10-CM | POA: Diagnosis not present

## 2015-02-13 DIAGNOSIS — I5032 Chronic diastolic (congestive) heart failure: Secondary | ICD-10-CM | POA: Diagnosis not present

## 2015-02-13 DIAGNOSIS — N184 Chronic kidney disease, stage 4 (severe): Secondary | ICD-10-CM | POA: Diagnosis not present

## 2015-02-13 DIAGNOSIS — I482 Chronic atrial fibrillation: Secondary | ICD-10-CM | POA: Diagnosis not present

## 2015-02-13 DIAGNOSIS — D509 Iron deficiency anemia, unspecified: Secondary | ICD-10-CM | POA: Diagnosis present

## 2015-02-13 DIAGNOSIS — R0602 Shortness of breath: Secondary | ICD-10-CM | POA: Diagnosis not present

## 2015-02-13 DIAGNOSIS — I5023 Acute on chronic systolic (congestive) heart failure: Secondary | ICD-10-CM | POA: Diagnosis not present

## 2015-02-13 DIAGNOSIS — F329 Major depressive disorder, single episode, unspecified: Secondary | ICD-10-CM | POA: Diagnosis present

## 2015-02-13 DIAGNOSIS — I481 Persistent atrial fibrillation: Secondary | ICD-10-CM | POA: Diagnosis not present

## 2015-02-13 DIAGNOSIS — R296 Repeated falls: Secondary | ICD-10-CM | POA: Diagnosis present

## 2015-02-13 DIAGNOSIS — I272 Other secondary pulmonary hypertension: Secondary | ICD-10-CM | POA: Diagnosis not present

## 2015-02-13 DIAGNOSIS — M549 Dorsalgia, unspecified: Secondary | ICD-10-CM | POA: Diagnosis present

## 2015-02-13 DIAGNOSIS — Z87820 Personal history of traumatic brain injury: Secondary | ICD-10-CM | POA: Diagnosis not present

## 2015-02-13 DIAGNOSIS — G8929 Other chronic pain: Secondary | ICD-10-CM | POA: Diagnosis present

## 2015-02-13 DIAGNOSIS — F419 Anxiety disorder, unspecified: Secondary | ICD-10-CM | POA: Diagnosis present

## 2015-02-13 DIAGNOSIS — D649 Anemia, unspecified: Secondary | ICD-10-CM | POA: Diagnosis not present

## 2015-02-13 DIAGNOSIS — Z7982 Long term (current) use of aspirin: Secondary | ICD-10-CM | POA: Diagnosis not present

## 2015-02-13 DIAGNOSIS — I4891 Unspecified atrial fibrillation: Secondary | ICD-10-CM | POA: Diagnosis not present

## 2015-02-13 DIAGNOSIS — Z882 Allergy status to sulfonamides status: Secondary | ICD-10-CM | POA: Diagnosis not present

## 2015-02-13 DIAGNOSIS — I130 Hypertensive heart and chronic kidney disease with heart failure and stage 1 through stage 4 chronic kidney disease, or unspecified chronic kidney disease: Secondary | ICD-10-CM | POA: Diagnosis not present

## 2015-02-13 DIAGNOSIS — I340 Nonrheumatic mitral (valve) insufficiency: Secondary | ICD-10-CM | POA: Diagnosis not present

## 2015-02-13 DIAGNOSIS — R9431 Abnormal electrocardiogram [ECG] [EKG]: Secondary | ICD-10-CM | POA: Diagnosis not present

## 2015-02-13 DIAGNOSIS — R079 Chest pain, unspecified: Secondary | ICD-10-CM | POA: Diagnosis not present

## 2015-02-13 NOTE — Telephone Encounter (Signed)
Hello   Per the referral that was sent through epic we have called to schedule this pt. We have spoken with the patients husband and he has chosen to decline the referral being that the pt is seen with [facility]. Please let us know if we can do anything further to assist you   Shanterra R  PCC-Scheduling

## 2015-02-18 ENCOUNTER — Telehealth: Payer: Self-pay

## 2015-02-18 DIAGNOSIS — I482 Chronic atrial fibrillation: Secondary | ICD-10-CM | POA: Diagnosis not present

## 2015-02-18 DIAGNOSIS — M1990 Unspecified osteoarthritis, unspecified site: Secondary | ICD-10-CM | POA: Diagnosis not present

## 2015-02-18 DIAGNOSIS — G8929 Other chronic pain: Secondary | ICD-10-CM | POA: Diagnosis not present

## 2015-02-18 DIAGNOSIS — I5042 Chronic combined systolic (congestive) and diastolic (congestive) heart failure: Secondary | ICD-10-CM | POA: Diagnosis not present

## 2015-02-18 DIAGNOSIS — I272 Other secondary pulmonary hypertension: Secondary | ICD-10-CM | POA: Diagnosis not present

## 2015-02-18 DIAGNOSIS — N184 Chronic kidney disease, stage 4 (severe): Secondary | ICD-10-CM | POA: Diagnosis not present

## 2015-02-18 NOTE — Telephone Encounter (Signed)
Pt has been discharged from [facility] with congestive heart failure. [facility] is requesting the following:  Skilled nursing, medication management and disease teachings  Frequency:  2 times/ week for 1 week 1 time/week for 3 weeks once every 2 weeks for 5 weeks  And referral for OT

## 2015-02-20 DIAGNOSIS — M1990 Unspecified osteoarthritis, unspecified site: Secondary | ICD-10-CM | POA: Diagnosis not present

## 2015-02-20 DIAGNOSIS — N184 Chronic kidney disease, stage 4 (severe): Secondary | ICD-10-CM | POA: Diagnosis not present

## 2015-02-20 DIAGNOSIS — I5042 Chronic combined systolic (congestive) and diastolic (congestive) heart failure: Secondary | ICD-10-CM | POA: Diagnosis not present

## 2015-02-20 DIAGNOSIS — G8929 Other chronic pain: Secondary | ICD-10-CM | POA: Diagnosis not present

## 2015-02-20 DIAGNOSIS — I272 Other secondary pulmonary hypertension: Secondary | ICD-10-CM | POA: Diagnosis not present

## 2015-02-20 DIAGNOSIS — I482 Chronic atrial fibrillation: Secondary | ICD-10-CM | POA: Diagnosis not present

## 2015-02-20 NOTE — Telephone Encounter (Signed)
Spoke with Diane Mejia this has been done and scheduled already. It was just FYI for Dr. Joseph Art.

## 2015-02-20 NOTE — Telephone Encounter (Signed)
Is this ok, I can fill out paperwork. Does she need to be seen for hospital follow up first?

## 2015-02-20 NOTE — Telephone Encounter (Signed)
Thanks.  If you would fill out the paper work, that would help me.  No need for office visit.

## 2015-02-21 DIAGNOSIS — M1990 Unspecified osteoarthritis, unspecified site: Secondary | ICD-10-CM | POA: Diagnosis not present

## 2015-02-21 DIAGNOSIS — N184 Chronic kidney disease, stage 4 (severe): Secondary | ICD-10-CM | POA: Diagnosis not present

## 2015-02-21 DIAGNOSIS — G8929 Other chronic pain: Secondary | ICD-10-CM | POA: Diagnosis not present

## 2015-02-21 DIAGNOSIS — I5042 Chronic combined systolic (congestive) and diastolic (congestive) heart failure: Secondary | ICD-10-CM | POA: Diagnosis not present

## 2015-02-21 DIAGNOSIS — I272 Other secondary pulmonary hypertension: Secondary | ICD-10-CM | POA: Diagnosis not present

## 2015-02-21 DIAGNOSIS — I482 Chronic atrial fibrillation: Secondary | ICD-10-CM | POA: Diagnosis not present

## 2015-02-24 ENCOUNTER — Telehealth: Payer: Self-pay

## 2015-02-24 DIAGNOSIS — N184 Chronic kidney disease, stage 4 (severe): Secondary | ICD-10-CM | POA: Diagnosis not present

## 2015-02-24 DIAGNOSIS — I482 Chronic atrial fibrillation: Secondary | ICD-10-CM | POA: Diagnosis not present

## 2015-02-24 DIAGNOSIS — R5381 Other malaise: Secondary | ICD-10-CM

## 2015-02-24 DIAGNOSIS — G8929 Other chronic pain: Secondary | ICD-10-CM | POA: Diagnosis not present

## 2015-02-24 DIAGNOSIS — I5042 Chronic combined systolic (congestive) and diastolic (congestive) heart failure: Secondary | ICD-10-CM | POA: Diagnosis not present

## 2015-02-24 DIAGNOSIS — M1990 Unspecified osteoarthritis, unspecified site: Secondary | ICD-10-CM | POA: Diagnosis not present

## 2015-02-24 DIAGNOSIS — I272 Other secondary pulmonary hypertension: Secondary | ICD-10-CM | POA: Diagnosis not present

## 2015-02-24 NOTE — Telephone Encounter (Signed)
Christine from [employer] called to get approval for Diane Mejia to receive physical therapy twice a week for three weeks. She also needs a verbal approval to check the pt's pulse, oxygen saturation, and to report any abnormalities to Dr. Carlean Jews.

## 2015-02-25 NOTE — Telephone Encounter (Signed)
Dr L- Can you help with this?

## 2015-02-27 DIAGNOSIS — G8929 Other chronic pain: Secondary | ICD-10-CM | POA: Diagnosis not present

## 2015-02-27 DIAGNOSIS — M1990 Unspecified osteoarthritis, unspecified site: Secondary | ICD-10-CM | POA: Diagnosis not present

## 2015-02-27 DIAGNOSIS — I5042 Chronic combined systolic (congestive) and diastolic (congestive) heart failure: Secondary | ICD-10-CM | POA: Diagnosis not present

## 2015-02-27 DIAGNOSIS — N184 Chronic kidney disease, stage 4 (severe): Secondary | ICD-10-CM | POA: Diagnosis not present

## 2015-02-27 DIAGNOSIS — I272 Other secondary pulmonary hypertension: Secondary | ICD-10-CM | POA: Diagnosis not present

## 2015-02-27 DIAGNOSIS — I482 Chronic atrial fibrillation: Secondary | ICD-10-CM | POA: Diagnosis not present

## 2015-03-01 DIAGNOSIS — N184 Chronic kidney disease, stage 4 (severe): Secondary | ICD-10-CM | POA: Diagnosis not present

## 2015-03-01 DIAGNOSIS — G8929 Other chronic pain: Secondary | ICD-10-CM | POA: Diagnosis not present

## 2015-03-01 DIAGNOSIS — I272 Other secondary pulmonary hypertension: Secondary | ICD-10-CM | POA: Diagnosis not present

## 2015-03-01 DIAGNOSIS — I5042 Chronic combined systolic (congestive) and diastolic (congestive) heart failure: Secondary | ICD-10-CM | POA: Diagnosis not present

## 2015-03-01 DIAGNOSIS — I482 Chronic atrial fibrillation: Secondary | ICD-10-CM | POA: Diagnosis not present

## 2015-03-01 DIAGNOSIS — M1990 Unspecified osteoarthritis, unspecified site: Secondary | ICD-10-CM | POA: Diagnosis not present

## 2015-03-03 DIAGNOSIS — I482 Chronic atrial fibrillation: Secondary | ICD-10-CM | POA: Diagnosis not present

## 2015-03-03 DIAGNOSIS — G8929 Other chronic pain: Secondary | ICD-10-CM | POA: Diagnosis not present

## 2015-03-03 DIAGNOSIS — I272 Other secondary pulmonary hypertension: Secondary | ICD-10-CM | POA: Diagnosis not present

## 2015-03-03 DIAGNOSIS — N184 Chronic kidney disease, stage 4 (severe): Secondary | ICD-10-CM | POA: Diagnosis not present

## 2015-03-03 DIAGNOSIS — I5042 Chronic combined systolic (congestive) and diastolic (congestive) heart failure: Secondary | ICD-10-CM | POA: Diagnosis not present

## 2015-03-03 DIAGNOSIS — M1990 Unspecified osteoarthritis, unspecified site: Secondary | ICD-10-CM | POA: Diagnosis not present

## 2015-03-08 ENCOUNTER — Telehealth: Payer: Self-pay

## 2015-03-08 DIAGNOSIS — N184 Chronic kidney disease, stage 4 (severe): Secondary | ICD-10-CM | POA: Diagnosis not present

## 2015-03-08 DIAGNOSIS — I272 Other secondary pulmonary hypertension: Secondary | ICD-10-CM | POA: Diagnosis not present

## 2015-03-08 DIAGNOSIS — I482 Chronic atrial fibrillation: Secondary | ICD-10-CM | POA: Diagnosis not present

## 2015-03-08 DIAGNOSIS — M1990 Unspecified osteoarthritis, unspecified site: Secondary | ICD-10-CM | POA: Diagnosis not present

## 2015-03-08 DIAGNOSIS — G8929 Other chronic pain: Secondary | ICD-10-CM | POA: Diagnosis not present

## 2015-03-08 DIAGNOSIS — I5042 Chronic combined systolic (congestive) and diastolic (congestive) heart failure: Secondary | ICD-10-CM | POA: Diagnosis not present

## 2015-03-08 NOTE — Telephone Encounter (Signed)
RN w/[facility] called to report that pt has been complaining of a feeling of SOB and when PT was there earlier pt's O2 sats were between 87 - 90 % and when he called here, he was told to have pt go to ER. RN stated that when she assessed pt the O2 sat was 94-97% with lying and sitting, but did drop to 83% w/walking. Pt had no visible SOB and did not seem in any distress although she did state that she did feel some SOB w/walking. RN also reported that the pt's O2 sat and BP both read significantly lower when taken in her right hand/arm than in left. RN advised that she doesn't feel that the pt needs to go to the ER unless she worsens but does think she should RTC to be seen. I gave her Dr Lenn Cal sch but advised another provider can see her if she wants to come in before Dr L is back. Also advised to have pt go to ER if her SOB worsens. Dr Carlean Jews, Jul[facility] Rainier

## 2015-03-08 NOTE — Telephone Encounter (Signed)
Physical therapist with [facility] called. Pt has been experiencing dyspnea and oxygen levels have dropped to 88%. I advised her that she needed to be seen right away, either ED, here, or call an ambulance. But pt's husband doesn't want to take her anywhere. I strongly urged them to get her seen, but they don't seem to want to do anything. FYI

## 2015-03-15 DIAGNOSIS — G8929 Other chronic pain: Secondary | ICD-10-CM | POA: Diagnosis not present

## 2015-03-15 DIAGNOSIS — I482 Chronic atrial fibrillation: Secondary | ICD-10-CM | POA: Diagnosis not present

## 2015-03-15 DIAGNOSIS — N184 Chronic kidney disease, stage 4 (severe): Secondary | ICD-10-CM | POA: Diagnosis not present

## 2015-03-15 DIAGNOSIS — M1990 Unspecified osteoarthritis, unspecified site: Secondary | ICD-10-CM | POA: Diagnosis not present

## 2015-03-15 DIAGNOSIS — I5042 Chronic combined systolic (congestive) and diastolic (congestive) heart failure: Secondary | ICD-10-CM | POA: Diagnosis not present

## 2015-03-15 DIAGNOSIS — I272 Other secondary pulmonary hypertension: Secondary | ICD-10-CM | POA: Diagnosis not present

## 2015-03-22 DIAGNOSIS — N184 Chronic kidney disease, stage 4 (severe): Secondary | ICD-10-CM | POA: Diagnosis not present

## 2015-03-22 DIAGNOSIS — I5042 Chronic combined systolic (congestive) and diastolic (congestive) heart failure: Secondary | ICD-10-CM | POA: Diagnosis not present

## 2015-03-22 DIAGNOSIS — G8929 Other chronic pain: Secondary | ICD-10-CM | POA: Diagnosis not present

## 2015-03-22 DIAGNOSIS — I482 Chronic atrial fibrillation: Secondary | ICD-10-CM | POA: Diagnosis not present

## 2015-03-22 DIAGNOSIS — I272 Other secondary pulmonary hypertension: Secondary | ICD-10-CM | POA: Diagnosis not present

## 2015-03-22 DIAGNOSIS — M1990 Unspecified osteoarthritis, unspecified site: Secondary | ICD-10-CM | POA: Diagnosis not present

## 2015-03-28 DIAGNOSIS — I482 Chronic atrial fibrillation: Secondary | ICD-10-CM | POA: Diagnosis not present

## 2015-03-28 DIAGNOSIS — D649 Anemia, unspecified: Secondary | ICD-10-CM | POA: Diagnosis not present

## 2015-03-28 DIAGNOSIS — F418 Other specified anxiety disorders: Secondary | ICD-10-CM | POA: Diagnosis not present

## 2015-03-28 DIAGNOSIS — D509 Iron deficiency anemia, unspecified: Secondary | ICD-10-CM | POA: Diagnosis not present

## 2015-03-28 DIAGNOSIS — G8929 Other chronic pain: Secondary | ICD-10-CM | POA: Diagnosis not present

## 2015-03-28 DIAGNOSIS — M549 Dorsalgia, unspecified: Secondary | ICD-10-CM | POA: Diagnosis not present

## 2015-03-28 DIAGNOSIS — I5042 Chronic combined systolic (congestive) and diastolic (congestive) heart failure: Secondary | ICD-10-CM | POA: Diagnosis not present

## 2015-03-28 DIAGNOSIS — Z9181 History of falling: Secondary | ICD-10-CM | POA: Diagnosis not present

## 2015-03-28 DIAGNOSIS — N179 Acute kidney failure, unspecified: Secondary | ICD-10-CM | POA: Diagnosis not present

## 2015-03-28 DIAGNOSIS — N184 Chronic kidney disease, stage 4 (severe): Secondary | ICD-10-CM | POA: Diagnosis not present

## 2015-04-05 DIAGNOSIS — M1990 Unspecified osteoarthritis, unspecified site: Secondary | ICD-10-CM | POA: Diagnosis not present

## 2015-04-05 DIAGNOSIS — I482 Chronic atrial fibrillation: Secondary | ICD-10-CM | POA: Diagnosis not present

## 2015-04-05 DIAGNOSIS — N184 Chronic kidney disease, stage 4 (severe): Secondary | ICD-10-CM | POA: Diagnosis not present

## 2015-04-05 DIAGNOSIS — I272 Other secondary pulmonary hypertension: Secondary | ICD-10-CM | POA: Diagnosis not present

## 2015-04-05 DIAGNOSIS — G8929 Other chronic pain: Secondary | ICD-10-CM | POA: Diagnosis not present

## 2015-04-05 DIAGNOSIS — I5042 Chronic combined systolic (congestive) and diastolic (congestive) heart failure: Secondary | ICD-10-CM | POA: Diagnosis not present

## 2015-04-07 DIAGNOSIS — I509 Heart failure, unspecified: Secondary | ICD-10-CM | POA: Diagnosis not present

## 2015-04-07 DIAGNOSIS — I4891 Unspecified atrial fibrillation: Secondary | ICD-10-CM | POA: Diagnosis not present

## 2015-04-07 DIAGNOSIS — F329 Major depressive disorder, single episode, unspecified: Secondary | ICD-10-CM | POA: Diagnosis not present

## 2015-04-07 DIAGNOSIS — F410 Panic disorder [episodic paroxysmal anxiety] without agoraphobia: Secondary | ICD-10-CM | POA: Diagnosis not present

## 2015-04-10 ENCOUNTER — Other Ambulatory Visit: Payer: Self-pay

## 2015-04-18 DIAGNOSIS — I272 Other secondary pulmonary hypertension: Secondary | ICD-10-CM | POA: Diagnosis not present

## 2015-04-18 DIAGNOSIS — G8929 Other chronic pain: Secondary | ICD-10-CM | POA: Diagnosis not present

## 2015-04-18 DIAGNOSIS — M1990 Unspecified osteoarthritis, unspecified site: Secondary | ICD-10-CM | POA: Diagnosis not present

## 2015-04-18 DIAGNOSIS — N184 Chronic kidney disease, stage 4 (severe): Secondary | ICD-10-CM | POA: Diagnosis not present

## 2015-04-18 DIAGNOSIS — I482 Chronic atrial fibrillation: Secondary | ICD-10-CM | POA: Diagnosis not present

## 2015-04-18 DIAGNOSIS — I5042 Chronic combined systolic (congestive) and diastolic (congestive) heart failure: Secondary | ICD-10-CM | POA: Diagnosis not present

## 2015-06-13 DIAGNOSIS — I5042 Chronic combined systolic (congestive) and diastolic (congestive) heart failure: Secondary | ICD-10-CM | POA: Diagnosis not present

## 2015-06-13 DIAGNOSIS — I482 Chronic atrial fibrillation: Secondary | ICD-10-CM | POA: Diagnosis not present

## 2015-07-05 DIAGNOSIS — M4316 Spondylolisthesis, lumbar region: Secondary | ICD-10-CM | POA: Diagnosis not present

## 2015-07-05 DIAGNOSIS — G894 Chronic pain syndrome: Secondary | ICD-10-CM | POA: Diagnosis not present

## 2015-07-05 DIAGNOSIS — M4806 Spinal stenosis, lumbar region: Secondary | ICD-10-CM | POA: Diagnosis not present

## 2015-07-05 DIAGNOSIS — Z79891 Long term (current) use of opiate analgesic: Secondary | ICD-10-CM | POA: Diagnosis not present

## 2015-07-27 NOTE — Patient Outreach (Signed)
[facility]) Care Management  07/27/2015  Diane Mejia 23-Nov-1922 [id number]   Referral from [place] 4 list, assigned [name], RN to outreach for [place] Management services.  Thanks, [name]. [place], [place] Assistant Phone: [phone] Fax: [phone]

## 2015-07-28 IMAGING — CR DG CHEST 1V PORT
1 series · 1 of 1 positions shown · non-contrast
Comparison: none

[AP]
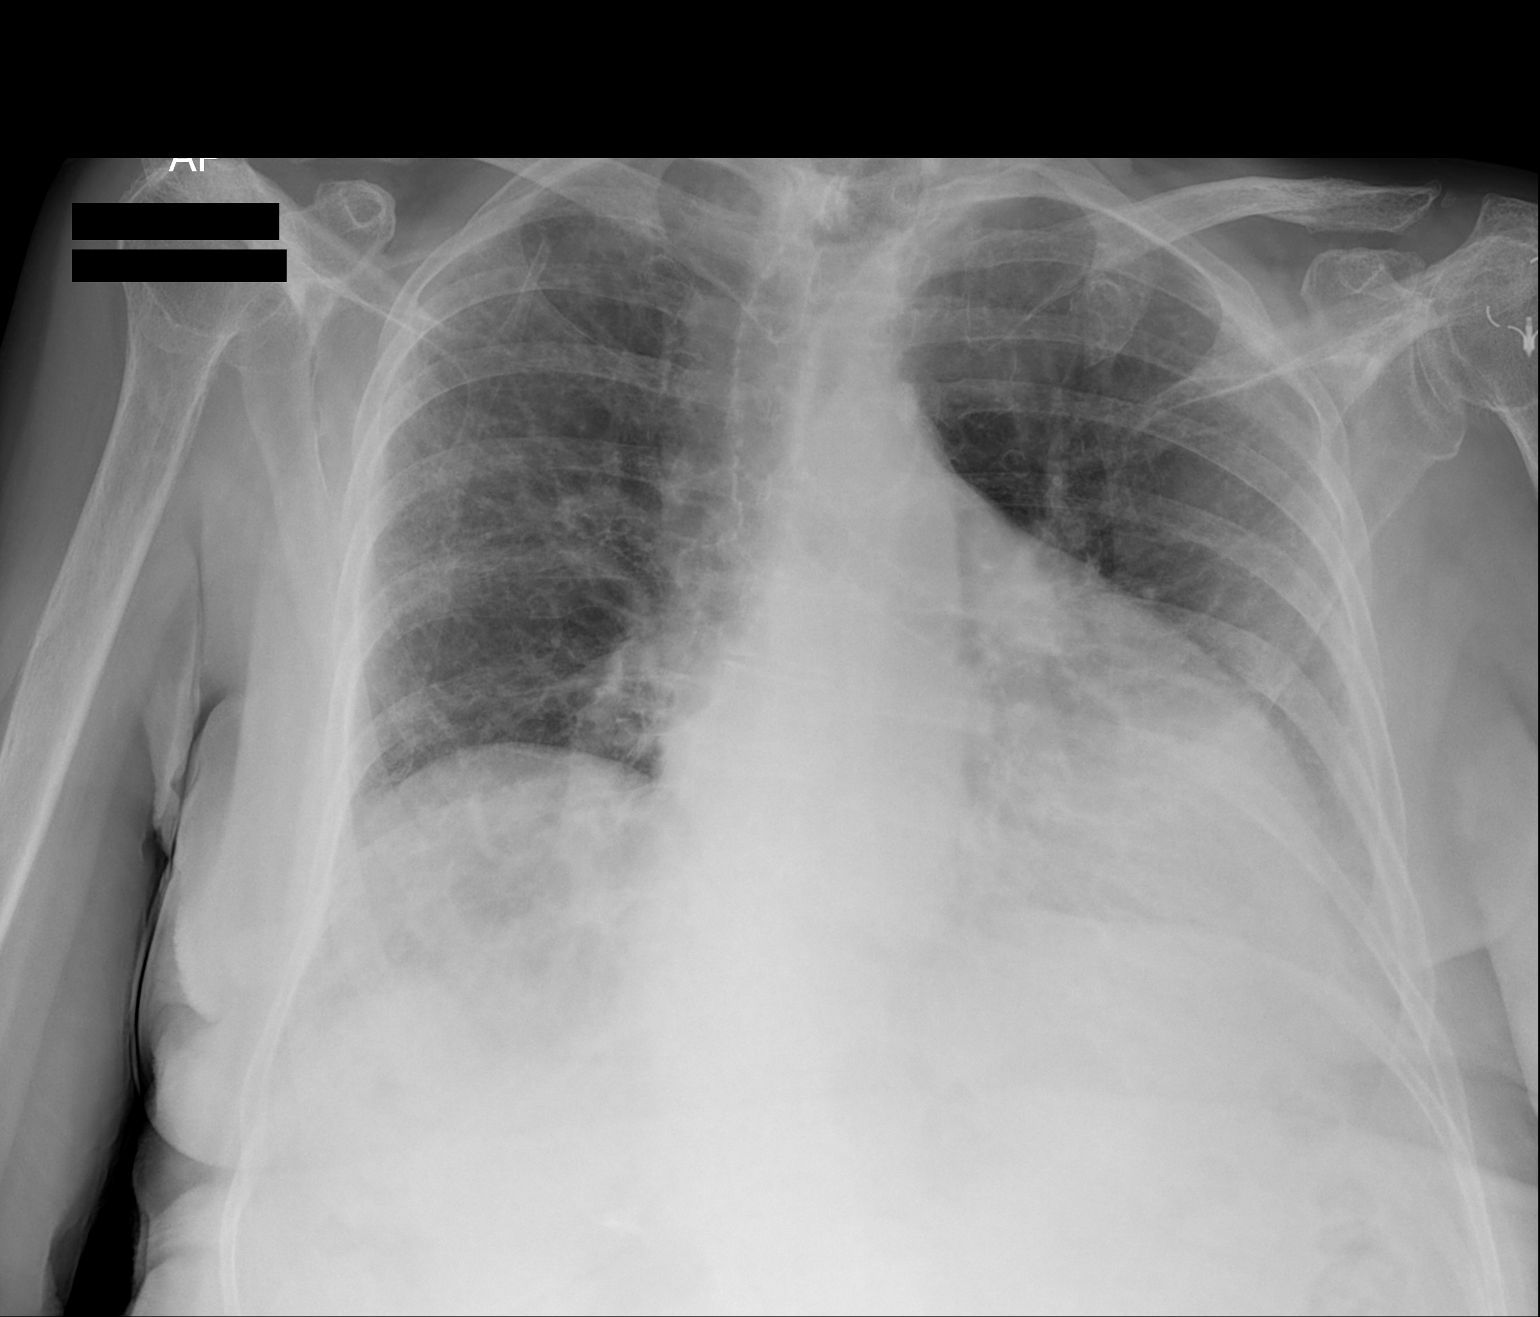

[1 of 1 positions shown; findings below may reference images not displayed]

CLINICAL DATA
Atrial fibrillation.

EXAM
PORTABLE CHEST - 1 VIEW

COMPARISON
August 04, 2013.

FINDINGS
Stable cardiomegaly. No acute pulmonary disease is noted. No pleural
effusion or pneumothorax is noted.

IMPRESSION
No acute cardiopulmonary abnormality seen.

SIGNATURE

## 2015-08-02 ENCOUNTER — Other Ambulatory Visit: Payer: Self-pay

## 2015-08-03 ENCOUNTER — Other Ambulatory Visit: Payer: Self-pay

## 2015-08-03 NOTE — Patient Outreach (Signed)
[facility]) Care Management  08/03/2015  Diane Mejia 10-Sep-1923 [id number]   SUBJECTIVE: Telephone call to patient regarding [facility] high risk referral.  HIPAA verified with patient.  Discussed and offered [facility] care management services to patient.  Patient refused services at this time stating she would like to discuss this further with her husband.  Patient verbalized agreement to received [facility] care management outreach letter and brochure.    PLAN: RNCM will send patient outreach letter and brochure as discussed.  RNCM will forward patient to [place] Del to close due to refusal of services.  RNCM will notify patients primary MD of refusal of services.   [name] RN,BSN,CCM [place] Coordinator [phone]

## 2015-08-07 NOTE — Patient Outreach (Signed)
[facility]) Care Management  08/07/2015  Diane Mejia 1923-09-02 [id number]   Notification from [name], RN to close case due to patient refused [place] Management services.  Thanks, [name]. [place], [place] Assistant Phone: [phone] Fax: [phone]

## 2015-08-10 ENCOUNTER — Telehealth: Payer: Self-pay | Admitting: Family Medicine

## 2015-10-11 ENCOUNTER — Other Ambulatory Visit: Payer: Self-pay | Admitting: Family Medicine

## 2015-10-31 ENCOUNTER — Emergency Department (HOSPITAL_COMMUNITY): Payer: Medicare Other

## 2015-10-31 ENCOUNTER — Encounter (HOSPITAL_COMMUNITY): Payer: Self-pay | Admitting: Emergency Medicine

## 2015-10-31 ENCOUNTER — Emergency Department (HOSPITAL_COMMUNITY)
Admission: EM | Admit: 2015-10-31 | Discharge: 2015-10-31 | Disposition: A | Discharge status: Home / Self Care | Payer: Medicare Other | Attending: Emergency Medicine | Admitting: Emergency Medicine

## 2015-10-31 DIAGNOSIS — Y998 Other external cause status: Secondary | ICD-10-CM | POA: Diagnosis not present

## 2015-10-31 DIAGNOSIS — S0292XA Unspecified fracture of facial bones, initial encounter for closed fracture: Secondary | ICD-10-CM | POA: Diagnosis not present

## 2015-10-31 DIAGNOSIS — Z8669 Personal history of other diseases of the nervous system and sense organs: Secondary | ICD-10-CM | POA: Insufficient documentation

## 2015-10-31 DIAGNOSIS — M1990 Unspecified osteoarthritis, unspecified site: Secondary | ICD-10-CM | POA: Insufficient documentation

## 2015-10-31 DIAGNOSIS — F329 Major depressive disorder, single episode, unspecified: Secondary | ICD-10-CM | POA: Diagnosis not present

## 2015-10-31 DIAGNOSIS — Y9289 Other specified places as the place of occurrence of the external cause: Secondary | ICD-10-CM | POA: Insufficient documentation

## 2015-10-31 DIAGNOSIS — Z7982 Long term (current) use of aspirin: Secondary | ICD-10-CM | POA: Insufficient documentation

## 2015-10-31 DIAGNOSIS — S52592A Other fractures of lower end of left radius, initial encounter for closed fracture: Secondary | ICD-10-CM | POA: Diagnosis not present

## 2015-10-31 DIAGNOSIS — Z7902 Long term (current) use of antithrombotics/antiplatelets: Secondary | ICD-10-CM | POA: Diagnosis not present

## 2015-10-31 DIAGNOSIS — S022XXA Fracture of nasal bones, initial encounter for closed fracture: Secondary | ICD-10-CM | POA: Diagnosis not present

## 2015-10-31 DIAGNOSIS — Z79899 Other long term (current) drug therapy: Secondary | ICD-10-CM | POA: Diagnosis not present

## 2015-10-31 DIAGNOSIS — Y9389 Activity, other specified: Secondary | ICD-10-CM | POA: Insufficient documentation

## 2015-10-31 DIAGNOSIS — W010XXA Fall on same level from slipping, tripping and stumbling without subsequent striking against object, initial encounter: Secondary | ICD-10-CM | POA: Diagnosis not present

## 2015-10-31 DIAGNOSIS — Z23 Encounter for immunization: Secondary | ICD-10-CM | POA: Insufficient documentation

## 2015-10-31 DIAGNOSIS — I4891 Unspecified atrial fibrillation: Secondary | ICD-10-CM | POA: Diagnosis not present

## 2015-10-31 DIAGNOSIS — Z862 Personal history of diseases of the blood and blood-forming organs and certain disorders involving the immune mechanism: Secondary | ICD-10-CM | POA: Diagnosis not present

## 2015-10-31 DIAGNOSIS — S0083XA Contusion of other part of head, initial encounter: Secondary | ICD-10-CM | POA: Insufficient documentation

## 2015-10-31 DIAGNOSIS — S0230XA Fracture of orbital floor, unspecified side, initial encounter for closed fracture: Secondary | ICD-10-CM | POA: Diagnosis not present

## 2015-10-31 DIAGNOSIS — S0993XA Unspecified injury of face, initial encounter: Secondary | ICD-10-CM | POA: Diagnosis present

## 2015-10-31 DIAGNOSIS — R011 Cardiac murmur, unspecified: Secondary | ICD-10-CM | POA: Diagnosis not present

## 2015-10-31 DIAGNOSIS — S52502A Unspecified fracture of the lower end of left radius, initial encounter for closed fracture: Secondary | ICD-10-CM

## 2015-10-31 DIAGNOSIS — S59292A Other physeal fracture of lower end of radius, left arm, initial encounter for closed fracture: Secondary | ICD-10-CM | POA: Diagnosis not present

## 2015-10-31 DIAGNOSIS — S199XXA Unspecified injury of neck, initial encounter: Secondary | ICD-10-CM | POA: Diagnosis not present

## 2015-10-31 LAB — PROTIME-INR
INR: 1.04 (ref 0.00–1.49)
PROTHROMBIN TIME: 13.8 s (ref 11.6–15.2)

## 2015-10-31 MED ORDER — OXYCODONE-ACETAMINOPHEN 5-325 MG PO TABS
2.0000 | ORAL_TABLET | Freq: Once | ORAL | Status: AC
  Administered 2015-10-31: 2 via ORAL
  Filled 2015-10-31: qty 2

## 2015-10-31 MED ORDER — TETANUS-DIPHTH-ACELL PERTUSSIS 5-2.5-18.5 LF-MCG/0.5 IM SUSP
0.5000 mL | Freq: Once | INTRAMUSCULAR | Status: AC
  Administered 2015-10-31: 0.5 mL via INTRAMUSCULAR
  Filled 2015-10-31: qty 0.5

## 2015-10-31 MED ORDER — OXYMETAZOLINE HCL 0.05 % NA SOLN
1.0000 | Freq: Once | NASAL | Status: AC
  Administered 2015-10-31: 1 via NASAL
  Filled 2015-10-31: qty 15

## 2015-10-31 MED ORDER — AMOXICILLIN-POT CLAVULANATE 875-125 MG PO TABS: 1.0000 | ORAL_TABLET | Freq: Two times a day (BID) | ORAL | Status: DC

## 2015-10-31 NOTE — ED Notes (Signed)
Ortho Tech paged.
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
## 2015-10-31 NOTE — ED Provider Notes (Signed)
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 precautions including any signs of infraorbital nerve impingement or EOM entrapment, as well as any signs of neurovasc compromise of hand. Reviewed supportive care instructions. Pt voiced understanding and was discharged in satisfactory condition.   I personally performed the services described in this documentation, which was scribed in my presence. The recorded information has been reviewed and is accurate.   Sharlett Iles, MD 11/01/15 2028

## 2015-10-31 NOTE — ED Notes (Signed)
Pt states she was in the restroom tonight and fell  Pt has bruising noted to her nose and the left side of her face with a small laceration noted under her left eye  Pt states her nose was bleeding but has got it to quit at this time  Pt is also c/o left wrist pain Pt denies LOC

## 2015-10-31 NOTE — Discharge Instructions (Signed)
You may use AFRIN 1 spray in each nostril twice a day FOR TWO DAYS ONLY  NO NOSE BLOWING FOR 2 WEEKS

## 2015-11-02 DIAGNOSIS — S52531A Colles' fracture of right radius, initial encounter for closed fracture: Secondary | ICD-10-CM | POA: Diagnosis not present

## 2015-11-09 DIAGNOSIS — S52531D Colles' fracture of right radius, subsequent encounter for closed fracture with routine healing: Secondary | ICD-10-CM | POA: Diagnosis not present

## 2015-11-15 DIAGNOSIS — S0230XA Fracture of orbital floor, unspecified side, initial encounter for closed fracture: Secondary | ICD-10-CM | POA: Diagnosis not present

## 2015-11-15 DIAGNOSIS — S0240DA Maxillary fracture, left side, initial encounter for closed fracture: Secondary | ICD-10-CM | POA: Diagnosis not present

## 2015-12-08 DIAGNOSIS — S52531D Colles' fracture of right radius, subsequent encounter for closed fracture with routine healing: Secondary | ICD-10-CM | POA: Diagnosis not present

## 2016-10-14 HISTORY — PX: OTHER SURGICAL HISTORY: SHX169

## 2016-12-16 ENCOUNTER — Ambulatory Visit (INDEPENDENT_AMBULATORY_CARE_PROVIDER_SITE_OTHER): Payer: Medicare Other

## 2016-12-16 ENCOUNTER — Ambulatory Visit (INDEPENDENT_AMBULATORY_CARE_PROVIDER_SITE_OTHER): Payer: Medicare Other | Admitting: Physician Assistant

## 2016-12-16 VITALS — BP 110/74 | HR 73 | Temp 97.5°F | Resp 16 | Ht 61.0 in | Wt 158.6 lb

## 2016-12-16 DIAGNOSIS — R1310 Dysphagia, unspecified: Secondary | ICD-10-CM

## 2016-12-16 DIAGNOSIS — F419 Anxiety disorder, unspecified: Secondary | ICD-10-CM

## 2016-12-16 DIAGNOSIS — K449 Diaphragmatic hernia without obstruction or gangrene: Secondary | ICD-10-CM | POA: Diagnosis not present

## 2016-12-16 DIAGNOSIS — F418 Other specified anxiety disorders: Secondary | ICD-10-CM | POA: Diagnosis not present

## 2016-12-16 DIAGNOSIS — R1013 Epigastric pain: Secondary | ICD-10-CM | POA: Diagnosis not present

## 2016-12-16 DIAGNOSIS — F32A Depression, unspecified: Secondary | ICD-10-CM

## 2016-12-16 DIAGNOSIS — F329 Major depressive disorder, single episode, unspecified: Secondary | ICD-10-CM

## 2016-12-16 MED ORDER — FLUOXETINE HCL 10 MG PO TABS: 10.0000 mg | ORAL_TABLET | Freq: Every day | ORAL | 3 refills | Status: DC

## 2016-12-16 MED ORDER — ALPRAZOLAM 0.25 MG PO TABS: ORAL_TABLET | ORAL | 3 refills | Status: DC

## 2016-12-16 MED ORDER — ALPRAZOLAM 0.25 MG PO TABS: ORAL_TABLET | ORAL | 0 refills | Status: DC

## 2016-12-16 MED ORDER — ALPRAZOLAM 0.25 MG PO TABS
ORAL_TABLET | ORAL | 3 refills | Status: DC
Start: 2016-12-16 — End: 2016-12-16

## 2016-12-16 NOTE — Progress Notes (Signed)
Diane Mejia  MRN: [id number] DOB: 09-11-1923  PCP: No PCP Per Patient  Subjective:  Pt is a pleasant 83 year old female PMH HTN, CHF, atrial fibrillation, GERD, anxiety and depression, polymyalgia rheumatica who presents to clinic for difficulty swallowing x 2 weeks. She is accompanied today by her grandson. She has a difficult time hearing clearly.  "I just feel like I need to swallow", Feels like there is "something in there and I just need to swallow". "it just feels tight".   She is eating fine - this morning she had scrambled eggs, yesterday she ate a TV dinner. Her grandson reports she doesn't eat very much. She drinks Ensure.  Symptoms seem to be worse at night and worse when she lays down - however when asked if symptoms improve when she sits up, she says no.  Her granddaughter gave her "a smidgeon of a Xanax" last night, that seemed to help her get to sleep.  She denies pain or difficulty swallowing, food getting stuck, dry mouth, bad taste in her mouth, regurgitation symptoms, cough, drooling, SOB, wheezing, chest tightness, weight loss, fever, chills. She denies increased worrying, anxiety, depression. However does suffer from depression. She has been out of her depression medications. She reports relief from "whatever Dr. Joseph Art gave me". This appears to be Fluoxetine.  Grandson notes she and her husband "don't get out much", they watch a lot of television. Pt's husband suffers from dementia.   Review of Systems  Constitutional: Negative for appetite change, chills, diaphoresis, fatigue, fever and unexpected weight change.  HENT: Positive for trouble swallowing. Negative for congestion, dental problem, drooling, postnasal drip, rhinorrhea, sore throat and voice change.   Respiratory: Negative for apnea, cough, choking, chest tightness, shortness of breath, wheezing and stridor.   Cardiovascular: Negative for chest pain and palpitations.  Gastrointestinal: Negative for  abdominal pain, constipation, diarrhea, nausea and vomiting.  Neurological: Negative for syncope, weakness, light-headedness and headaches.  Psychiatric/Behavioral: Positive for sleep disturbance. The patient is nervous/anxious.     Patient Active Problem List   Diagnosis Date Noted  . Chronic pain syndrome 06/14/2014  . CHF exacerbation ([place]) 08/05/2013  . Orthostatic hypotension 05/08/2013  . Depressive disorder, not elsewhere classified 04/27/2013  . Fall 03/30/2013  . Traumatic subdural hematoma ([place]) 03/30/2013  . Right wrist fracture 03/30/2013  . Herpes zoster ophthalmicus 11/19/2012  . Polymyalgia rheumatica ([place]) 12/08/2011  . Weakness 11/30/2011  . GERD (gastroesophageal reflux disease) 11/29/2011  . Hypertension 11/29/2011  . Sinus arrhythmia 11/29/2011  . Iron deficiency anemia 11/29/2011  . Anxiety and depression 11/29/2011  . Atrial fibrillation ([place]) 11/29/2011    Current Outpatient Prescriptions on File Prior to Visit  Medication Sig Dispense Refill  . ALPRAZolam (XANAX) 0.25 MG tablet TAKE 1 TABLET BY MOUTH 3 TIMES A DAY AS NEEDED FOR ANXIETY 100 tablet 3  . PLAVIX 75 MG tablet Take 75 mg by mouth daily.     Marland Kitchen albuterol (PROVENTIL HFA;VENTOLIN HFA) 108 (90 BASE) MCG/ACT inhaler Inhale 2 puffs into the lungs every 6 (six) hours as needed for wheezing or shortness of breath. (Patient not taking: Reported on 12/16/2016) 1 Inhaler 0  . aspirin EC 81 MG tablet Take 81 mg by mouth at bedtime.    Marland Kitchen diltiazem (CARDIZEM CD) 120 MG 24 hr capsule Take 1 capsule (120 mg total) by mouth daily. 90 capsule 3  . Fluoxetine HCl, PMDD, 10 MG TABS Take 1 tablet (10 mg total) by mouth daily. 30 tablet 3  .  lisinopril (PRINIVIL,ZESTRIL) 2.5 MG tablet Take 2.5 mg by mouth daily.    Marland Kitchen oxyCODONE-acetaminophen (PERCOCET) 7.5-325 MG per tablet Take 1 tablet by mouth every 4 (four) hours as needed for moderate pain.      No current facility-administered medications on file prior to visit.      Allergies  Allergen Reactions  . Dilaudid [Hydromorphone Hcl] Itching  . Sulfa Antibiotics Hives, Itching and Rash     Objective:  BP 110/74   Pulse 73   Temp 97.5 F (36.4 C) (Oral)   Resp 16   Ht 5\' 1"  (1.549 m)   Wt 158 lb 9.6 oz (71.9 kg)   SpO2 92%   BMI 29.97 kg/m   Physical Exam  Constitutional: She is oriented to person, place, and time and well-developed, well-nourished, and in no distress. No distress.  HENT:  Right Ear: Tympanic membrane normal.  Left Ear: Tympanic membrane normal.  Mouth/Throat: Uvula is midline, oropharynx is clear and moist and mucous membranes are normal. She does not have dentures. Abnormal dentition. No dental abscesses.  No thrush appreciated.   Neck: Trachea normal and full passive range of motion without pain. Neck supple.  No mass noted  Cardiovascular: Normal rate, regular rhythm and normal heart sounds.   Lymphadenopathy:       Head (right side): No submandibular, no preauricular and no posterior auricular adenopathy present.       Head (left side): No submandibular, no preauricular and no posterior auricular adenopathy present.    She has no cervical adenopathy.  Neurological: She is alert and oriented to person, place, and time. She has intact cranial nerves. GCS score is 15.  Skin: Skin is warm and dry.  Psychiatric: Mood, memory, affect and judgment normal.  Vitals reviewed.   Dg Neck Soft Tissue  Result Date: 12/16/2016 CLINICAL DATA:  Dysphagia EXAM: NECK SOFT TISSUES - 1+ VIEW COMPARISON:  None. FINDINGS: Marked degenerative changes of the upper cervical spine. Prevertebral soft tissue thickness appears normal. Epiglottis within normal limits. Subglottic trachea patent. No radiopaque foreign body is visualized. Atherosclerosis of the aortic arch. IMPRESSION: Negative. Electronically Signed   By: Donavan Foil M.D.   On: 12/16/2016 17:35   Dg Chest 2 View  Result Date: 12/16/2016 CLINICAL DATA:  83 year old female the with  dysphagia for 2 weeks. Initial encounter. EXAM: CHEST  2 VIEW COMPARISON:  Portable chest 05/08/2014. CTA chest 08/04/2013 and earlier. FINDINGS: PA and lateral views of the chest. Moderate to large gastric hiatal hernia appears increased since 2014 (moderate at that time). Other mediastinal contours are within normal limits. Visualized tracheal air column is within normal limits. Similar lung volumes. Mild eventration of the right hemidiaphragm. Chronic mild peripheral reticular opacity in both lungs, maximal in the peripheral right upper lobe. No pneumothorax or pleural effusion. No acute or confluent pulmonary opacity identified. Osteopenia. Chronic postoperative changes to the proximal left humerus. No definite acute osseous abnormality identified. Stable cholecystectomy clips. Negative visible bowel gas pattern. IMPRESSION: 1. Moderate to large gastric hiatal hernia is chronic but progressed since 2014. 2. No acute cardiopulmonary abnormality. Calcified aortic atherosclerosis. Electronically Signed   By: Genevie Ann M.D.   On: 12/16/2016 17:47    Assessment and Plan :  This case was discussed with Dr. Mitchel Honour  1. Dysphagia, unspecified type 2. Hiatal hernia - DG Neck Soft Tissue; Future - DG Chest 2 View; Future - Ambulatory referral to Gastroenterology - Ambulatory referral to ENT - Ambulatory referral to Gastroenterology - Concern  for worsening hiatal hernia vs. malignancy vs anxiety vs GERD. Will refer to GI for work-up of worsening hiatal hernia, and possible endoscopy. ENT referral for work-up. RTC in 4-6 weeks.   3. Anxiety and depression - FLUoxetine (PROZAC) 10 MG tablet; Take 1 tablet (10 mg total) by mouth daily.  Dispense: 30 tablet; Refill: 3 - ALPRAZolam (XANAX) 0.25 MG tablet; Take 0.5-1 tablet as needed for anxiety.  Dispense: 30 tablet; Refill: 0 - Pt's grandson says she will take her medications "exactly as directions on the bottle indicate", not PRN. He requests no refills - his  aunt will refill the medications when due, and pt will continue to take. Pt's family will call when refill needed.    Mercer Pod, PA-C  [facility] 12/16/2016 5:02 PM

## 2016-12-16 NOTE — Patient Instructions (Addendum)
She needs to taper off Prozac if/when she comes off it. GI will call you to schedule an appointment to assess your problem swallowing and hiatal hernia.    Thank you for coming in today. I hope you feel we met your needs.  Feel free to call [facility] if you have any questions or further requests.  Please consider signing up for MyChart if you do not already have it, as this is a great way to communicate with me.  Best,  Whitney McVey, PA-C  IF you received an x-ray today, you will receive an invoice from [facility]. Please contact [facility] at [phone] with questions or concerns regarding your invoice.   IF you received labwork today, you will receive an invoice from [facility]. Please contact [facility] at [phone] with questions or concerns regarding your invoice.   Our billing staff will not be able to assist you with questions regarding bills from these companies.  You will be contacted with the lab results as soon as they are available. The fastest way to get your results is to activate your My Chart account. Instructions are located on the last page of this paperwork. If you have not heard from [place] regarding the results in 2 weeks, please contact this office.

## 2016-12-18 DIAGNOSIS — Z85828 Personal history of other malignant neoplasm of skin: Secondary | ICD-10-CM | POA: Diagnosis not present

## 2016-12-18 DIAGNOSIS — C44622 Squamous cell carcinoma of skin of right upper limb, including shoulder: Secondary | ICD-10-CM | POA: Diagnosis not present

## 2016-12-18 DIAGNOSIS — D485 Neoplasm of uncertain behavior of skin: Secondary | ICD-10-CM | POA: Diagnosis not present

## 2017-01-24 DIAGNOSIS — M25511 Pain in right shoulder: Secondary | ICD-10-CM | POA: Diagnosis not present

## 2017-01-24 DIAGNOSIS — G894 Chronic pain syndrome: Secondary | ICD-10-CM | POA: Diagnosis not present

## 2017-01-24 DIAGNOSIS — Z79891 Long term (current) use of opiate analgesic: Secondary | ICD-10-CM | POA: Diagnosis not present

## 2017-01-24 DIAGNOSIS — M4316 Spondylolisthesis, lumbar region: Secondary | ICD-10-CM | POA: Diagnosis not present

## 2017-01-27 ENCOUNTER — Ambulatory Visit: Payer: Self-pay | Admitting: Physician Assistant

## 2017-02-17 ENCOUNTER — Encounter: Payer: Self-pay | Admitting: Physician Assistant

## 2017-02-19 DIAGNOSIS — R221 Localized swelling, mass and lump, neck: Secondary | ICD-10-CM | POA: Diagnosis not present

## 2017-04-09 ENCOUNTER — Other Ambulatory Visit: Payer: Self-pay | Admitting: Physician Assistant

## 2017-04-09 DIAGNOSIS — F419 Anxiety disorder, unspecified: Principal | ICD-10-CM

## 2017-04-09 DIAGNOSIS — F329 Major depressive disorder, single episode, unspecified: Secondary | ICD-10-CM

## 2017-04-10 NOTE — Telephone Encounter (Signed)
Please advise 

## 2017-06-30 ENCOUNTER — Ambulatory Visit: Payer: Medicare Other | Admitting: Nurse Practitioner

## 2017-07-01 ENCOUNTER — Ambulatory Visit: Payer: Self-pay | Admitting: Internal Medicine

## 2017-07-15 ENCOUNTER — Ambulatory Visit (INDEPENDENT_AMBULATORY_CARE_PROVIDER_SITE_OTHER): Payer: Medicare Other | Admitting: Sports Medicine

## 2017-07-15 ENCOUNTER — Encounter: Payer: Self-pay | Admitting: Sports Medicine

## 2017-07-15 VITALS — BP 119/64 | HR 68 | Resp 16

## 2017-07-15 DIAGNOSIS — B351 Tinea unguium: Secondary | ICD-10-CM

## 2017-07-15 DIAGNOSIS — L853 Xerosis cutis: Secondary | ICD-10-CM | POA: Diagnosis not present

## 2017-07-15 DIAGNOSIS — I739 Peripheral vascular disease, unspecified: Secondary | ICD-10-CM | POA: Diagnosis not present

## 2017-07-15 DIAGNOSIS — M79675 Pain in left toe(s): Secondary | ICD-10-CM

## 2017-07-15 DIAGNOSIS — M79674 Pain in right toe(s): Secondary | ICD-10-CM | POA: Diagnosis not present

## 2017-07-15 NOTE — Progress Notes (Signed)
Subjective: Diane Mejia is a 83 y.o. female patient seen today in office with complaint of painful thickened and elongated toenails; unable to trim. Patient denies history of Diabetes, Neuropathy, or Vascular disease. Patient is assisted by grandson who states that she hasnt had nails cut in years. Patient has no other pedal complaints at this time.   Review of Systems  All other systems reviewed and are negative.   Patient Active Problem List   Diagnosis Date Noted  . Chronic pain syndrome 06/14/2014  . CHF exacerbation ([place]) 08/05/2013  . Orthostatic hypotension 05/08/2013  . Depressive disorder, not elsewhere classified 04/27/2013  . Fall 03/30/2013  . Traumatic subdural hematoma ([place]) 03/30/2013  . Right wrist fracture 03/30/2013  . Herpes zoster ophthalmicus 11/19/2012  . Polymyalgia rheumatica ([place]) 12/08/2011  . Weakness 11/30/2011  . GERD (gastroesophageal reflux disease) 11/29/2011  . Hypertension 11/29/2011  . Sinus arrhythmia 11/29/2011  . Iron deficiency anemia 11/29/2011  . Anxiety and depression 11/29/2011  . Atrial fibrillation ([place]) 11/29/2011    Current Outpatient Prescriptions on File Prior to Visit  Medication Sig Dispense Refill  . albuterol (PROVENTIL HFA;VENTOLIN HFA) 108 (90 BASE) MCG/ACT inhaler Inhale 2 puffs into the lungs every 6 (six) hours as needed for wheezing or shortness of breath. 1 Inhaler 0  . ALPRAZolam (XANAX) 0.25 MG tablet Take 0.5-1 tablet as needed for anxiety. 30 tablet 0  . aspirin EC 81 MG tablet Take 81 mg by mouth at bedtime.    Marland Kitchen diltiazem (CARDIZEM CD) 120 MG 24 hr capsule Take 1 capsule (120 mg total) by mouth daily. 90 capsule 3  . FLUoxetine (PROZAC) 10 MG tablet TAKE 1 TABLET (10 MG TOTAL) BY MOUTH DAILY. 30 tablet 3  . Fluoxetine HCl, PMDD, 10 MG TABS Take 1 tablet (10 mg total) by mouth daily. 30 tablet 3  . furosemide (LASIX) 40 MG tablet Take 40 mg by mouth.    Marland Kitchen lisinopril (PRINIVIL,ZESTRIL) 2.5 MG tablet Take 2.5 mg by  mouth daily.    . metoprolol succinate (TOPROL-XL) 100 MG 24 hr tablet Take 100 mg by mouth daily. Take with or immediately following a meal.    . oxyCODONE-acetaminophen (PERCOCET) 7.5-325 MG per tablet Take 1 tablet by mouth every 4 (four) hours as needed for moderate pain.     Marland Kitchen PLAVIX 75 MG tablet Take 75 mg by mouth daily.      No current facility-administered medications on file prior to visit.     Allergies  Allergen Reactions  . Dilaudid [Hydromorphone Hcl] Itching  . Sulfa Antibiotics Hives, Itching and Rash    Objective: Physical Exam  General: Well developed, nourished, no acute distress, awake, alert and oriented x 3  Vascular: Dorsalis pedis artery 1/4 bilateral, Posterior tibial artery 1/4 bilateral, skin temperature warm to warm proximal to distal bilateral lower extremities, + varicosities, no pedal hair present bilateral.  Neurological: Gross sensation present via light touch bilateral.   Dermatological: Skin is warm, dry, and supple bilateral, Nails 1-10 are tender, long, thick, and discolored with mild subungal debris, no webspace macerations present bilateral, no open lesions present bilateral, no callus/corns/hyperkeratotic tissue present bilateral. No signs of infection bilateral.  Musculoskeletal: Asymptomatic bunion boney deformities noted bilateral. Muscular strength within normal limits without painon range of motion. No pain with calf compression bilateral.  Assessment and Plan:  Problem List Items Addressed This Visit    None    Visit Diagnoses    Pain due to onychomycosis of toenails of both  feet    -  Primary   PVD (peripheral vascular disease) (HCC)       Dry skin          -Examined patient.  -Discussed treatment options for painful mycotic nails. -Mechanically debrided and reduced mycotic nails with sterile nail nipper and dremel nail file without incident. -Recommend daily skin emollients  -Patient to return in 3 months for follow up  evaluation or sooner if symptoms worsen.  Landis Martins, DPM

## 2017-07-28 DIAGNOSIS — R05 Cough: Secondary | ICD-10-CM | POA: Diagnosis not present

## 2017-08-05 DIAGNOSIS — I4891 Unspecified atrial fibrillation: Secondary | ICD-10-CM | POA: Diagnosis not present

## 2017-08-05 DIAGNOSIS — Z23 Encounter for immunization: Secondary | ICD-10-CM | POA: Diagnosis not present

## 2017-08-11 ENCOUNTER — Other Ambulatory Visit: Payer: Self-pay | Admitting: Physician Assistant

## 2017-08-11 DIAGNOSIS — F329 Major depressive disorder, single episode, unspecified: Secondary | ICD-10-CM

## 2017-08-11 DIAGNOSIS — F419 Anxiety disorder, unspecified: Principal | ICD-10-CM

## 2017-08-11 NOTE — Telephone Encounter (Signed)
Please advise/refill FLUoxetine (PROZAC)

## 2017-08-12 ENCOUNTER — Ambulatory Visit: Payer: Medicare Other | Admitting: Nurse Practitioner

## 2017-08-26 ENCOUNTER — Telehealth: Payer: Self-pay

## 2017-08-26 ENCOUNTER — Ambulatory Visit: Payer: Medicare Other | Admitting: Nurse Practitioner

## 2017-08-26 NOTE — Telephone Encounter (Signed)
I left a message asking that patient call the office to reschedule new patient appointment that was missed today.

## 2017-08-28 DIAGNOSIS — R296 Repeated falls: Secondary | ICD-10-CM | POA: Diagnosis not present

## 2017-08-28 DIAGNOSIS — F321 Major depressive disorder, single episode, moderate: Secondary | ICD-10-CM | POA: Diagnosis not present

## 2017-08-28 DIAGNOSIS — I4891 Unspecified atrial fibrillation: Secondary | ICD-10-CM | POA: Diagnosis not present

## 2017-08-28 DIAGNOSIS — R5381 Other malaise: Secondary | ICD-10-CM | POA: Diagnosis not present

## 2017-09-01 ENCOUNTER — Telehealth: Payer: Self-pay | Admitting: Nurse Practitioner

## 2017-09-01 NOTE — Telephone Encounter (Signed)
Spoke with Dr. Radene Ou office our last labs are 83 years old.

## 2017-09-01 NOTE — Telephone Encounter (Signed)
Copied from [place] [id number]. Topic: Quick Communication - See Telephone Encounter >> Sep 01, 2017 10:28 AM Percell Belt A wrote: CRM for notification. See Telephone encounter for:   Dr Radene Ou office called and is needing the most recent labs faxed over to them: Fax number [phone] Attn Tiffinae  09/01/17.

## 2017-09-02 DIAGNOSIS — Z0389 Encounter for observation for other suspected diseases and conditions ruled out: Secondary | ICD-10-CM | POA: Diagnosis not present

## 2017-09-13 DIAGNOSIS — I509 Heart failure, unspecified: Secondary | ICD-10-CM | POA: Diagnosis not present

## 2017-09-13 DIAGNOSIS — R296 Repeated falls: Secondary | ICD-10-CM | POA: Diagnosis not present

## 2017-09-13 DIAGNOSIS — R5381 Other malaise: Secondary | ICD-10-CM | POA: Diagnosis not present

## 2017-09-13 DIAGNOSIS — F321 Major depressive disorder, single episode, moderate: Secondary | ICD-10-CM | POA: Diagnosis not present

## 2017-09-13 DIAGNOSIS — D649 Anemia, unspecified: Secondary | ICD-10-CM | POA: Diagnosis not present

## 2017-09-13 DIAGNOSIS — I4891 Unspecified atrial fibrillation: Secondary | ICD-10-CM | POA: Diagnosis not present

## 2017-09-17 DIAGNOSIS — F321 Major depressive disorder, single episode, moderate: Secondary | ICD-10-CM | POA: Diagnosis not present

## 2017-09-17 DIAGNOSIS — I4891 Unspecified atrial fibrillation: Secondary | ICD-10-CM | POA: Diagnosis not present

## 2017-09-17 DIAGNOSIS — R296 Repeated falls: Secondary | ICD-10-CM | POA: Diagnosis not present

## 2017-09-17 DIAGNOSIS — I509 Heart failure, unspecified: Secondary | ICD-10-CM | POA: Diagnosis not present

## 2017-09-17 DIAGNOSIS — R5381 Other malaise: Secondary | ICD-10-CM | POA: Diagnosis not present

## 2017-09-17 DIAGNOSIS — D649 Anemia, unspecified: Secondary | ICD-10-CM | POA: Diagnosis not present

## 2017-09-24 ENCOUNTER — Telehealth: Payer: Self-pay

## 2017-09-24 NOTE — Telephone Encounter (Signed)
I spoke with patient's grandson to inform him that patient cannot be re-scheduled for a new patient appointment due to multiple no show and cancelled appointments with Dr. Gildardo Cranker and Sherrie Mustache, NP.   Pt no showed on 06/30/17 Pt canceled on the following days: 07/01/17, 08/12/17, 08/26/17, 09/26/17.   Patient's grandson verbalized understanding.

## 2017-09-24 NOTE — Telephone Encounter (Signed)
Noted  

## 2017-09-25 DIAGNOSIS — R5381 Other malaise: Secondary | ICD-10-CM | POA: Diagnosis not present

## 2017-09-25 DIAGNOSIS — F321 Major depressive disorder, single episode, moderate: Secondary | ICD-10-CM | POA: Diagnosis not present

## 2017-09-25 DIAGNOSIS — D649 Anemia, unspecified: Secondary | ICD-10-CM | POA: Diagnosis not present

## 2017-09-25 DIAGNOSIS — I509 Heart failure, unspecified: Secondary | ICD-10-CM | POA: Diagnosis not present

## 2017-09-25 DIAGNOSIS — I4891 Unspecified atrial fibrillation: Secondary | ICD-10-CM | POA: Diagnosis not present

## 2017-09-25 DIAGNOSIS — R296 Repeated falls: Secondary | ICD-10-CM | POA: Diagnosis not present

## 2017-09-26 ENCOUNTER — Ambulatory Visit: Payer: Self-pay | Admitting: Internal Medicine

## 2017-09-30 DIAGNOSIS — R296 Repeated falls: Secondary | ICD-10-CM | POA: Diagnosis not present

## 2017-09-30 DIAGNOSIS — R5381 Other malaise: Secondary | ICD-10-CM | POA: Diagnosis not present

## 2017-09-30 DIAGNOSIS — D649 Anemia, unspecified: Secondary | ICD-10-CM | POA: Diagnosis not present

## 2017-09-30 DIAGNOSIS — N189 Chronic kidney disease, unspecified: Secondary | ICD-10-CM | POA: Diagnosis not present

## 2017-09-30 DIAGNOSIS — I4891 Unspecified atrial fibrillation: Secondary | ICD-10-CM | POA: Diagnosis not present

## 2017-09-30 DIAGNOSIS — I509 Heart failure, unspecified: Secondary | ICD-10-CM | POA: Diagnosis not present

## 2017-09-30 DIAGNOSIS — D72829 Elevated white blood cell count, unspecified: Secondary | ICD-10-CM | POA: Diagnosis not present

## 2017-09-30 DIAGNOSIS — F321 Major depressive disorder, single episode, moderate: Secondary | ICD-10-CM | POA: Diagnosis not present

## 2017-10-01 DIAGNOSIS — R5381 Other malaise: Secondary | ICD-10-CM | POA: Diagnosis not present

## 2017-10-01 DIAGNOSIS — R296 Repeated falls: Secondary | ICD-10-CM | POA: Diagnosis not present

## 2017-10-01 DIAGNOSIS — F321 Major depressive disorder, single episode, moderate: Secondary | ICD-10-CM | POA: Diagnosis not present

## 2017-10-01 DIAGNOSIS — I4891 Unspecified atrial fibrillation: Secondary | ICD-10-CM | POA: Diagnosis not present

## 2017-10-01 DIAGNOSIS — D649 Anemia, unspecified: Secondary | ICD-10-CM | POA: Diagnosis not present

## 2017-10-01 DIAGNOSIS — I509 Heart failure, unspecified: Secondary | ICD-10-CM | POA: Diagnosis not present

## 2017-10-02 DIAGNOSIS — D649 Anemia, unspecified: Secondary | ICD-10-CM | POA: Diagnosis not present

## 2017-10-02 DIAGNOSIS — I509 Heart failure, unspecified: Secondary | ICD-10-CM | POA: Diagnosis not present

## 2017-10-02 DIAGNOSIS — R296 Repeated falls: Secondary | ICD-10-CM | POA: Diagnosis not present

## 2017-10-02 DIAGNOSIS — I4891 Unspecified atrial fibrillation: Secondary | ICD-10-CM | POA: Diagnosis not present

## 2017-10-02 DIAGNOSIS — F321 Major depressive disorder, single episode, moderate: Secondary | ICD-10-CM | POA: Diagnosis not present

## 2017-10-02 DIAGNOSIS — R5381 Other malaise: Secondary | ICD-10-CM | POA: Diagnosis not present

## 2017-10-10 DIAGNOSIS — I4891 Unspecified atrial fibrillation: Secondary | ICD-10-CM | POA: Diagnosis not present

## 2017-10-10 DIAGNOSIS — F321 Major depressive disorder, single episode, moderate: Secondary | ICD-10-CM | POA: Diagnosis not present

## 2017-10-10 DIAGNOSIS — I509 Heart failure, unspecified: Secondary | ICD-10-CM | POA: Diagnosis not present

## 2017-10-10 DIAGNOSIS — R5381 Other malaise: Secondary | ICD-10-CM | POA: Diagnosis not present

## 2017-10-10 DIAGNOSIS — R296 Repeated falls: Secondary | ICD-10-CM | POA: Diagnosis not present

## 2017-10-10 DIAGNOSIS — D649 Anemia, unspecified: Secondary | ICD-10-CM | POA: Diagnosis not present

## 2017-10-16 DIAGNOSIS — R296 Repeated falls: Secondary | ICD-10-CM | POA: Diagnosis not present

## 2017-10-16 DIAGNOSIS — I509 Heart failure, unspecified: Secondary | ICD-10-CM | POA: Diagnosis not present

## 2017-10-16 DIAGNOSIS — D649 Anemia, unspecified: Secondary | ICD-10-CM | POA: Diagnosis not present

## 2017-10-16 DIAGNOSIS — F321 Major depressive disorder, single episode, moderate: Secondary | ICD-10-CM | POA: Diagnosis not present

## 2017-10-16 DIAGNOSIS — R5381 Other malaise: Secondary | ICD-10-CM | POA: Diagnosis not present

## 2017-10-16 DIAGNOSIS — I4891 Unspecified atrial fibrillation: Secondary | ICD-10-CM | POA: Diagnosis not present

## 2017-10-17 DIAGNOSIS — D649 Anemia, unspecified: Secondary | ICD-10-CM | POA: Diagnosis not present

## 2017-10-17 DIAGNOSIS — F321 Major depressive disorder, single episode, moderate: Secondary | ICD-10-CM | POA: Diagnosis not present

## 2017-10-17 DIAGNOSIS — R296 Repeated falls: Secondary | ICD-10-CM | POA: Diagnosis not present

## 2017-10-17 DIAGNOSIS — I4891 Unspecified atrial fibrillation: Secondary | ICD-10-CM | POA: Diagnosis not present

## 2017-10-17 DIAGNOSIS — I509 Heart failure, unspecified: Secondary | ICD-10-CM | POA: Diagnosis not present

## 2017-10-17 DIAGNOSIS — R5381 Other malaise: Secondary | ICD-10-CM | POA: Diagnosis not present

## 2017-10-21 ENCOUNTER — Ambulatory Visit: Payer: Medicare Other | Admitting: Sports Medicine

## 2017-10-21 DIAGNOSIS — D649 Anemia, unspecified: Secondary | ICD-10-CM | POA: Diagnosis not present

## 2017-10-21 DIAGNOSIS — R296 Repeated falls: Secondary | ICD-10-CM | POA: Diagnosis not present

## 2017-10-21 DIAGNOSIS — I509 Heart failure, unspecified: Secondary | ICD-10-CM | POA: Diagnosis not present

## 2017-10-21 DIAGNOSIS — I4891 Unspecified atrial fibrillation: Secondary | ICD-10-CM | POA: Diagnosis not present

## 2017-10-21 DIAGNOSIS — R5381 Other malaise: Secondary | ICD-10-CM | POA: Diagnosis not present

## 2017-10-21 DIAGNOSIS — F321 Major depressive disorder, single episode, moderate: Secondary | ICD-10-CM | POA: Diagnosis not present

## 2017-10-23 ENCOUNTER — Telehealth: Payer: Self-pay | Admitting: Physician Assistant

## 2017-10-23 DIAGNOSIS — D649 Anemia, unspecified: Secondary | ICD-10-CM | POA: Diagnosis not present

## 2017-10-23 DIAGNOSIS — R296 Repeated falls: Secondary | ICD-10-CM | POA: Diagnosis not present

## 2017-10-23 DIAGNOSIS — I509 Heart failure, unspecified: Secondary | ICD-10-CM | POA: Diagnosis not present

## 2017-10-23 DIAGNOSIS — F321 Major depressive disorder, single episode, moderate: Secondary | ICD-10-CM | POA: Diagnosis not present

## 2017-10-23 DIAGNOSIS — I4891 Unspecified atrial fibrillation: Secondary | ICD-10-CM | POA: Diagnosis not present

## 2017-10-23 DIAGNOSIS — R5381 Other malaise: Secondary | ICD-10-CM | POA: Diagnosis not present

## 2017-10-23 NOTE — Telephone Encounter (Signed)
Copied from [place] [id number] [id number]. Topic: Quick Communication - Rx Refill/Question >> Oct 23, 2017 12:28 PM Cecelia Byars, NT wrote: Medication: fluoxentine 40 mg Has the patient contacted their pharmacy? {yes  (Agent: If no, request that the patient contact the pharmacy for the refill.) Preferred Pharmacy (with phone number or street name):[facility] [address]  [phone] Agent: Please be advised that RX refills may take up to 3 business days. We ask that you follow-up with your pharmacy. Pharmacy called and  said patient wants prescription changed to a 90 day supply

## 2017-10-23 NOTE — Telephone Encounter (Signed)
Please review for refill on fluoxentine. Last office visit was 12/16/16.

## 2017-10-24 ENCOUNTER — Other Ambulatory Visit: Payer: Self-pay | Admitting: *Deleted

## 2017-10-24 DIAGNOSIS — F329 Major depressive disorder, single episode, unspecified: Secondary | ICD-10-CM

## 2017-10-24 DIAGNOSIS — F419 Anxiety disorder, unspecified: Principal | ICD-10-CM

## 2017-10-24 DIAGNOSIS — F32A Depression, unspecified: Secondary | ICD-10-CM

## 2017-10-24 MED ORDER — FLUOXETINE HCL 10 MG PO TABS: 10.0000 mg | ORAL_TABLET | Freq: Every day | ORAL | 2 refills | Status: DC

## 2017-10-30 DIAGNOSIS — D649 Anemia, unspecified: Secondary | ICD-10-CM | POA: Diagnosis not present

## 2017-10-30 DIAGNOSIS — R5381 Other malaise: Secondary | ICD-10-CM | POA: Diagnosis not present

## 2017-10-30 DIAGNOSIS — I4891 Unspecified atrial fibrillation: Secondary | ICD-10-CM | POA: Diagnosis not present

## 2017-10-30 DIAGNOSIS — R296 Repeated falls: Secondary | ICD-10-CM | POA: Diagnosis not present

## 2017-10-30 DIAGNOSIS — I509 Heart failure, unspecified: Secondary | ICD-10-CM | POA: Diagnosis not present

## 2017-10-30 DIAGNOSIS — F321 Major depressive disorder, single episode, moderate: Secondary | ICD-10-CM | POA: Diagnosis not present

## 2017-11-05 DIAGNOSIS — N184 Chronic kidney disease, stage 4 (severe): Secondary | ICD-10-CM | POA: Diagnosis not present

## 2017-11-05 DIAGNOSIS — D631 Anemia in chronic kidney disease: Secondary | ICD-10-CM | POA: Diagnosis not present

## 2017-11-05 DIAGNOSIS — I4891 Unspecified atrial fibrillation: Secondary | ICD-10-CM | POA: Diagnosis not present

## 2017-11-05 DIAGNOSIS — I509 Heart failure, unspecified: Secondary | ICD-10-CM | POA: Diagnosis not present

## 2017-11-11 ENCOUNTER — Ambulatory Visit: Payer: Medicare Other | Admitting: Sports Medicine

## 2017-11-25 ENCOUNTER — Ambulatory Visit: Payer: Medicare Other | Admitting: Sports Medicine

## 2018-03-06 ENCOUNTER — Other Ambulatory Visit: Payer: Self-pay | Admitting: Physician Assistant

## 2018-03-06 DIAGNOSIS — F419 Anxiety disorder, unspecified: Principal | ICD-10-CM

## 2018-03-06 DIAGNOSIS — F329 Major depressive disorder, single episode, unspecified: Secondary | ICD-10-CM

## 2018-03-06 DIAGNOSIS — F32A Depression, unspecified: Secondary | ICD-10-CM

## 2018-03-06 MED ORDER — FLUOXETINE HCL 10 MG PO TABS: 10.0000 mg | ORAL_TABLET | Freq: Every day | ORAL | 0 refills | Status: AC

## 2018-05-16 ENCOUNTER — Other Ambulatory Visit: Payer: Self-pay | Admitting: Physician Assistant

## 2018-05-16 DIAGNOSIS — F419 Anxiety disorder, unspecified: Principal | ICD-10-CM

## 2018-05-16 DIAGNOSIS — F329 Major depressive disorder, single episode, unspecified: Secondary | ICD-10-CM

## 2018-05-27 DIAGNOSIS — I4891 Unspecified atrial fibrillation: Secondary | ICD-10-CM | POA: Diagnosis not present

## 2018-05-27 DIAGNOSIS — Q845 Enlarged and hypertrophic nails: Secondary | ICD-10-CM | POA: Diagnosis not present

## 2018-05-27 DIAGNOSIS — F3289 Other specified depressive episodes: Secondary | ICD-10-CM | POA: Diagnosis not present

## 2018-05-27 DIAGNOSIS — I5089 Other heart failure: Secondary | ICD-10-CM | POA: Diagnosis not present

## 2018-05-29 DIAGNOSIS — I509 Heart failure, unspecified: Secondary | ICD-10-CM | POA: Diagnosis not present

## 2018-05-29 DIAGNOSIS — Z79899 Other long term (current) drug therapy: Secondary | ICD-10-CM | POA: Diagnosis not present

## 2018-05-29 DIAGNOSIS — F339 Major depressive disorder, recurrent, unspecified: Secondary | ICD-10-CM | POA: Diagnosis not present

## 2018-06-01 DIAGNOSIS — Z79899 Other long term (current) drug therapy: Secondary | ICD-10-CM | POA: Diagnosis not present

## 2018-06-03 DIAGNOSIS — Z79891 Long term (current) use of opiate analgesic: Secondary | ICD-10-CM | POA: Diagnosis not present

## 2018-06-03 DIAGNOSIS — Q845 Enlarged and hypertrophic nails: Secondary | ICD-10-CM | POA: Diagnosis not present

## 2018-06-03 DIAGNOSIS — Z9181 History of falling: Secondary | ICD-10-CM | POA: Diagnosis not present

## 2018-06-03 DIAGNOSIS — I4891 Unspecified atrial fibrillation: Secondary | ICD-10-CM | POA: Diagnosis not present

## 2018-06-03 DIAGNOSIS — D649 Anemia, unspecified: Secondary | ICD-10-CM | POA: Diagnosis not present

## 2018-06-03 DIAGNOSIS — F3289 Other specified depressive episodes: Secondary | ICD-10-CM | POA: Diagnosis not present

## 2018-06-03 DIAGNOSIS — E669 Obesity, unspecified: Secondary | ICD-10-CM | POA: Diagnosis not present

## 2018-06-03 DIAGNOSIS — Z7902 Long term (current) use of antithrombotics/antiplatelets: Secondary | ICD-10-CM | POA: Diagnosis not present

## 2018-06-03 DIAGNOSIS — H9190 Unspecified hearing loss, unspecified ear: Secondary | ICD-10-CM | POA: Diagnosis not present

## 2018-06-03 DIAGNOSIS — M545 Low back pain: Secondary | ICD-10-CM | POA: Diagnosis not present

## 2018-06-03 DIAGNOSIS — I5089 Other heart failure: Secondary | ICD-10-CM | POA: Diagnosis not present

## 2018-06-03 DIAGNOSIS — Z6826 Body mass index (BMI) 26.0-26.9, adult: Secondary | ICD-10-CM | POA: Diagnosis not present

## 2018-06-03 DIAGNOSIS — R4189 Other symptoms and signs involving cognitive functions and awareness: Secondary | ICD-10-CM | POA: Diagnosis not present

## 2018-06-05 DIAGNOSIS — I4891 Unspecified atrial fibrillation: Secondary | ICD-10-CM | POA: Diagnosis not present

## 2018-06-05 DIAGNOSIS — R4189 Other symptoms and signs involving cognitive functions and awareness: Secondary | ICD-10-CM | POA: Diagnosis not present

## 2018-06-05 DIAGNOSIS — I5089 Other heart failure: Secondary | ICD-10-CM | POA: Diagnosis not present

## 2018-06-05 DIAGNOSIS — Q845 Enlarged and hypertrophic nails: Secondary | ICD-10-CM | POA: Diagnosis not present

## 2018-06-05 DIAGNOSIS — M545 Low back pain: Secondary | ICD-10-CM | POA: Diagnosis not present

## 2018-06-05 DIAGNOSIS — D649 Anemia, unspecified: Secondary | ICD-10-CM | POA: Diagnosis not present

## 2018-06-09 DIAGNOSIS — I4891 Unspecified atrial fibrillation: Secondary | ICD-10-CM | POA: Diagnosis not present

## 2018-06-09 DIAGNOSIS — I5089 Other heart failure: Secondary | ICD-10-CM | POA: Diagnosis not present

## 2018-06-09 DIAGNOSIS — Q845 Enlarged and hypertrophic nails: Secondary | ICD-10-CM | POA: Diagnosis not present

## 2018-06-09 DIAGNOSIS — R4189 Other symptoms and signs involving cognitive functions and awareness: Secondary | ICD-10-CM | POA: Diagnosis not present

## 2018-06-09 DIAGNOSIS — D649 Anemia, unspecified: Secondary | ICD-10-CM | POA: Diagnosis not present

## 2018-06-09 DIAGNOSIS — M545 Low back pain: Secondary | ICD-10-CM | POA: Diagnosis not present

## 2018-06-11 DIAGNOSIS — I4891 Unspecified atrial fibrillation: Secondary | ICD-10-CM | POA: Diagnosis not present

## 2018-06-11 DIAGNOSIS — D649 Anemia, unspecified: Secondary | ICD-10-CM | POA: Diagnosis not present

## 2018-06-11 DIAGNOSIS — R4189 Other symptoms and signs involving cognitive functions and awareness: Secondary | ICD-10-CM | POA: Diagnosis not present

## 2018-06-11 DIAGNOSIS — I5089 Other heart failure: Secondary | ICD-10-CM | POA: Diagnosis not present

## 2018-06-11 DIAGNOSIS — Q845 Enlarged and hypertrophic nails: Secondary | ICD-10-CM | POA: Diagnosis not present

## 2018-06-11 DIAGNOSIS — M545 Low back pain: Secondary | ICD-10-CM | POA: Diagnosis not present

## 2018-06-16 DIAGNOSIS — I5089 Other heart failure: Secondary | ICD-10-CM | POA: Diagnosis not present

## 2018-06-16 DIAGNOSIS — D649 Anemia, unspecified: Secondary | ICD-10-CM | POA: Diagnosis not present

## 2018-06-16 DIAGNOSIS — R4189 Other symptoms and signs involving cognitive functions and awareness: Secondary | ICD-10-CM | POA: Diagnosis not present

## 2018-06-16 DIAGNOSIS — M545 Low back pain: Secondary | ICD-10-CM | POA: Diagnosis not present

## 2018-06-16 DIAGNOSIS — Q845 Enlarged and hypertrophic nails: Secondary | ICD-10-CM | POA: Diagnosis not present

## 2018-06-16 DIAGNOSIS — I4891 Unspecified atrial fibrillation: Secondary | ICD-10-CM | POA: Diagnosis not present

## 2018-06-17 DIAGNOSIS — Z79899 Other long term (current) drug therapy: Secondary | ICD-10-CM | POA: Diagnosis not present

## 2018-06-18 DIAGNOSIS — D649 Anemia, unspecified: Secondary | ICD-10-CM | POA: Diagnosis not present

## 2018-06-18 DIAGNOSIS — I5089 Other heart failure: Secondary | ICD-10-CM | POA: Diagnosis not present

## 2018-06-18 DIAGNOSIS — R4189 Other symptoms and signs involving cognitive functions and awareness: Secondary | ICD-10-CM | POA: Diagnosis not present

## 2018-06-18 DIAGNOSIS — M545 Low back pain: Secondary | ICD-10-CM | POA: Diagnosis not present

## 2018-06-18 DIAGNOSIS — Q845 Enlarged and hypertrophic nails: Secondary | ICD-10-CM | POA: Diagnosis not present

## 2018-06-18 DIAGNOSIS — I4891 Unspecified atrial fibrillation: Secondary | ICD-10-CM | POA: Diagnosis not present

## 2018-06-22 DIAGNOSIS — R4189 Other symptoms and signs involving cognitive functions and awareness: Secondary | ICD-10-CM | POA: Diagnosis not present

## 2018-06-22 DIAGNOSIS — M79673 Pain in unspecified foot: Secondary | ICD-10-CM | POA: Diagnosis not present

## 2018-06-22 DIAGNOSIS — M545 Low back pain: Secondary | ICD-10-CM | POA: Diagnosis not present

## 2018-06-22 DIAGNOSIS — Q845 Enlarged and hypertrophic nails: Secondary | ICD-10-CM | POA: Diagnosis not present

## 2018-06-22 DIAGNOSIS — I5089 Other heart failure: Secondary | ICD-10-CM | POA: Diagnosis not present

## 2018-06-22 DIAGNOSIS — B351 Tinea unguium: Secondary | ICD-10-CM | POA: Diagnosis not present

## 2018-06-22 DIAGNOSIS — D649 Anemia, unspecified: Secondary | ICD-10-CM | POA: Diagnosis not present

## 2018-06-22 DIAGNOSIS — I4891 Unspecified atrial fibrillation: Secondary | ICD-10-CM | POA: Diagnosis not present

## 2018-06-24 DIAGNOSIS — Q845 Enlarged and hypertrophic nails: Secondary | ICD-10-CM | POA: Diagnosis not present

## 2018-06-24 DIAGNOSIS — I5089 Other heart failure: Secondary | ICD-10-CM | POA: Diagnosis not present

## 2018-06-24 DIAGNOSIS — M545 Low back pain: Secondary | ICD-10-CM | POA: Diagnosis not present

## 2018-06-24 DIAGNOSIS — D649 Anemia, unspecified: Secondary | ICD-10-CM | POA: Diagnosis not present

## 2018-06-24 DIAGNOSIS — R4189 Other symptoms and signs involving cognitive functions and awareness: Secondary | ICD-10-CM | POA: Diagnosis not present

## 2018-06-24 DIAGNOSIS — I4891 Unspecified atrial fibrillation: Secondary | ICD-10-CM | POA: Diagnosis not present

## 2018-06-29 DIAGNOSIS — D649 Anemia, unspecified: Secondary | ICD-10-CM | POA: Diagnosis not present

## 2018-06-29 DIAGNOSIS — Q845 Enlarged and hypertrophic nails: Secondary | ICD-10-CM | POA: Diagnosis not present

## 2018-06-29 DIAGNOSIS — M545 Low back pain: Secondary | ICD-10-CM | POA: Diagnosis not present

## 2018-06-29 DIAGNOSIS — I5089 Other heart failure: Secondary | ICD-10-CM | POA: Diagnosis not present

## 2018-06-29 DIAGNOSIS — I4891 Unspecified atrial fibrillation: Secondary | ICD-10-CM | POA: Diagnosis not present

## 2018-06-29 DIAGNOSIS — R4189 Other symptoms and signs involving cognitive functions and awareness: Secondary | ICD-10-CM | POA: Diagnosis not present

## 2018-06-30 DIAGNOSIS — M545 Low back pain: Secondary | ICD-10-CM | POA: Diagnosis not present

## 2018-06-30 DIAGNOSIS — I5089 Other heart failure: Secondary | ICD-10-CM | POA: Diagnosis not present

## 2018-06-30 DIAGNOSIS — D649 Anemia, unspecified: Secondary | ICD-10-CM | POA: Diagnosis not present

## 2018-06-30 DIAGNOSIS — Q845 Enlarged and hypertrophic nails: Secondary | ICD-10-CM | POA: Diagnosis not present

## 2018-06-30 DIAGNOSIS — I4891 Unspecified atrial fibrillation: Secondary | ICD-10-CM | POA: Diagnosis not present

## 2018-06-30 DIAGNOSIS — R4189 Other symptoms and signs involving cognitive functions and awareness: Secondary | ICD-10-CM | POA: Diagnosis not present

## 2018-07-01 DIAGNOSIS — Z79899 Other long term (current) drug therapy: Secondary | ICD-10-CM | POA: Diagnosis not present

## 2018-07-01 DIAGNOSIS — M545 Low back pain: Secondary | ICD-10-CM | POA: Diagnosis not present

## 2018-07-01 DIAGNOSIS — F3289 Other specified depressive episodes: Secondary | ICD-10-CM | POA: Diagnosis not present

## 2018-07-01 DIAGNOSIS — G308 Other Alzheimer's disease: Secondary | ICD-10-CM | POA: Diagnosis not present

## 2018-07-01 DIAGNOSIS — R51 Headache: Secondary | ICD-10-CM | POA: Diagnosis not present

## 2018-07-04 DIAGNOSIS — R51 Headache: Secondary | ICD-10-CM | POA: Diagnosis not present

## 2018-07-04 DIAGNOSIS — M545 Low back pain: Secondary | ICD-10-CM | POA: Diagnosis not present

## 2018-07-04 DIAGNOSIS — F339 Major depressive disorder, recurrent, unspecified: Secondary | ICD-10-CM | POA: Diagnosis not present

## 2018-07-04 DIAGNOSIS — F0281 Dementia in other diseases classified elsewhere with behavioral disturbance: Secondary | ICD-10-CM | POA: Diagnosis not present

## 2018-07-06 DIAGNOSIS — D649 Anemia, unspecified: Secondary | ICD-10-CM | POA: Diagnosis not present

## 2018-07-06 DIAGNOSIS — I4891 Unspecified atrial fibrillation: Secondary | ICD-10-CM | POA: Diagnosis not present

## 2018-07-06 DIAGNOSIS — Q845 Enlarged and hypertrophic nails: Secondary | ICD-10-CM | POA: Diagnosis not present

## 2018-07-06 DIAGNOSIS — R4189 Other symptoms and signs involving cognitive functions and awareness: Secondary | ICD-10-CM | POA: Diagnosis not present

## 2018-07-06 DIAGNOSIS — I5089 Other heart failure: Secondary | ICD-10-CM | POA: Diagnosis not present

## 2018-07-06 DIAGNOSIS — M545 Low back pain: Secondary | ICD-10-CM | POA: Diagnosis not present

## 2018-07-08 DIAGNOSIS — R4189 Other symptoms and signs involving cognitive functions and awareness: Secondary | ICD-10-CM | POA: Diagnosis not present

## 2018-07-08 DIAGNOSIS — I4891 Unspecified atrial fibrillation: Secondary | ICD-10-CM | POA: Diagnosis not present

## 2018-07-08 DIAGNOSIS — I5089 Other heart failure: Secondary | ICD-10-CM | POA: Diagnosis not present

## 2018-07-08 DIAGNOSIS — Q845 Enlarged and hypertrophic nails: Secondary | ICD-10-CM | POA: Diagnosis not present

## 2018-07-08 DIAGNOSIS — D649 Anemia, unspecified: Secondary | ICD-10-CM | POA: Diagnosis not present

## 2018-07-08 DIAGNOSIS — M545 Low back pain: Secondary | ICD-10-CM | POA: Diagnosis not present

## 2018-07-13 DIAGNOSIS — I5089 Other heart failure: Secondary | ICD-10-CM | POA: Diagnosis not present

## 2018-07-13 DIAGNOSIS — Q845 Enlarged and hypertrophic nails: Secondary | ICD-10-CM | POA: Diagnosis not present

## 2018-07-13 DIAGNOSIS — R4189 Other symptoms and signs involving cognitive functions and awareness: Secondary | ICD-10-CM | POA: Diagnosis not present

## 2018-07-13 DIAGNOSIS — M545 Low back pain: Secondary | ICD-10-CM | POA: Diagnosis not present

## 2018-07-13 DIAGNOSIS — I4891 Unspecified atrial fibrillation: Secondary | ICD-10-CM | POA: Diagnosis not present

## 2018-07-13 DIAGNOSIS — D649 Anemia, unspecified: Secondary | ICD-10-CM | POA: Diagnosis not present

## 2018-07-15 DIAGNOSIS — Q845 Enlarged and hypertrophic nails: Secondary | ICD-10-CM | POA: Diagnosis not present

## 2018-07-15 DIAGNOSIS — I5089 Other heart failure: Secondary | ICD-10-CM | POA: Diagnosis not present

## 2018-07-15 DIAGNOSIS — M545 Low back pain: Secondary | ICD-10-CM | POA: Diagnosis not present

## 2018-07-15 DIAGNOSIS — I4891 Unspecified atrial fibrillation: Secondary | ICD-10-CM | POA: Diagnosis not present

## 2018-07-15 DIAGNOSIS — R4189 Other symptoms and signs involving cognitive functions and awareness: Secondary | ICD-10-CM | POA: Diagnosis not present

## 2018-07-15 DIAGNOSIS — D649 Anemia, unspecified: Secondary | ICD-10-CM | POA: Diagnosis not present

## 2018-07-20 DIAGNOSIS — Z111 Encounter for screening for respiratory tuberculosis: Secondary | ICD-10-CM | POA: Diagnosis not present

## 2018-07-20 DIAGNOSIS — K219 Gastro-esophageal reflux disease without esophagitis: Secondary | ICD-10-CM | POA: Diagnosis not present

## 2018-07-20 DIAGNOSIS — M545 Low back pain: Secondary | ICD-10-CM | POA: Diagnosis not present

## 2018-07-20 DIAGNOSIS — D631 Anemia in chronic kidney disease: Secondary | ICD-10-CM | POA: Diagnosis not present

## 2018-07-20 DIAGNOSIS — R4189 Other symptoms and signs involving cognitive functions and awareness: Secondary | ICD-10-CM | POA: Diagnosis not present

## 2018-07-20 DIAGNOSIS — I509 Heart failure, unspecified: Secondary | ICD-10-CM | POA: Diagnosis not present

## 2018-07-20 DIAGNOSIS — Z23 Encounter for immunization: Secondary | ICD-10-CM | POA: Diagnosis not present

## 2018-07-20 DIAGNOSIS — Q845 Enlarged and hypertrophic nails: Secondary | ICD-10-CM | POA: Diagnosis not present

## 2018-07-20 DIAGNOSIS — I5089 Other heart failure: Secondary | ICD-10-CM | POA: Diagnosis not present

## 2018-07-20 DIAGNOSIS — D649 Anemia, unspecified: Secondary | ICD-10-CM | POA: Diagnosis not present

## 2018-07-20 DIAGNOSIS — N184 Chronic kidney disease, stage 4 (severe): Secondary | ICD-10-CM | POA: Diagnosis not present

## 2018-07-20 DIAGNOSIS — I4891 Unspecified atrial fibrillation: Secondary | ICD-10-CM | POA: Diagnosis not present

## 2018-07-22 DIAGNOSIS — R4189 Other symptoms and signs involving cognitive functions and awareness: Secondary | ICD-10-CM | POA: Diagnosis not present

## 2018-07-22 DIAGNOSIS — Q845 Enlarged and hypertrophic nails: Secondary | ICD-10-CM | POA: Diagnosis not present

## 2018-07-22 DIAGNOSIS — M545 Low back pain: Secondary | ICD-10-CM | POA: Diagnosis not present

## 2018-07-22 DIAGNOSIS — I4891 Unspecified atrial fibrillation: Secondary | ICD-10-CM | POA: Diagnosis not present

## 2018-07-22 DIAGNOSIS — D649 Anemia, unspecified: Secondary | ICD-10-CM | POA: Diagnosis not present

## 2018-07-22 DIAGNOSIS — I5089 Other heart failure: Secondary | ICD-10-CM | POA: Diagnosis not present

## 2018-07-29 DIAGNOSIS — D649 Anemia, unspecified: Secondary | ICD-10-CM | POA: Diagnosis not present

## 2018-07-29 DIAGNOSIS — Q845 Enlarged and hypertrophic nails: Secondary | ICD-10-CM | POA: Diagnosis not present

## 2018-07-29 DIAGNOSIS — R4189 Other symptoms and signs involving cognitive functions and awareness: Secondary | ICD-10-CM | POA: Diagnosis not present

## 2018-07-29 DIAGNOSIS — M545 Low back pain: Secondary | ICD-10-CM | POA: Diagnosis not present

## 2018-07-29 DIAGNOSIS — I5089 Other heart failure: Secondary | ICD-10-CM | POA: Diagnosis not present

## 2018-07-29 DIAGNOSIS — I4891 Unspecified atrial fibrillation: Secondary | ICD-10-CM | POA: Diagnosis not present

## 2018-07-30 DIAGNOSIS — I5089 Other heart failure: Secondary | ICD-10-CM | POA: Diagnosis not present

## 2018-07-30 DIAGNOSIS — I4891 Unspecified atrial fibrillation: Secondary | ICD-10-CM | POA: Diagnosis not present

## 2018-07-30 DIAGNOSIS — D649 Anemia, unspecified: Secondary | ICD-10-CM | POA: Diagnosis not present

## 2018-07-30 DIAGNOSIS — R4189 Other symptoms and signs involving cognitive functions and awareness: Secondary | ICD-10-CM | POA: Diagnosis not present

## 2018-07-30 DIAGNOSIS — M545 Low back pain: Secondary | ICD-10-CM | POA: Diagnosis not present

## 2018-07-30 DIAGNOSIS — Q845 Enlarged and hypertrophic nails: Secondary | ICD-10-CM | POA: Diagnosis not present

## 2018-10-22 ENCOUNTER — Inpatient Hospital Stay (HOSPITAL_COMMUNITY): Payer: Medicare Other

## 2018-10-22 ENCOUNTER — Inpatient Hospital Stay (HOSPITAL_COMMUNITY)
Admission: EM | Admit: 2018-10-22 | Discharge: 2018-11-14 | DRG: 023 | Disposition: E | Payer: Medicare Other | Attending: Neurology | Admitting: Neurology

## 2018-10-22 ENCOUNTER — Inpatient Hospital Stay (HOSPITAL_COMMUNITY): Payer: Medicare Other | Admitting: Registered Nurse

## 2018-10-22 ENCOUNTER — Encounter (HOSPITAL_COMMUNITY): Admission: EM | Disposition: E | Payer: Self-pay | Source: Home / Self Care | Attending: Neurology

## 2018-10-22 ENCOUNTER — Emergency Department (HOSPITAL_COMMUNITY): Payer: Medicare Other

## 2018-10-22 ENCOUNTER — Other Ambulatory Visit: Payer: Self-pay

## 2018-10-22 ENCOUNTER — Encounter (HOSPITAL_COMMUNITY): Payer: Self-pay | Admitting: Emergency Medicine

## 2018-10-22 DIAGNOSIS — E876 Hypokalemia: Secondary | ICD-10-CM | POA: Diagnosis present

## 2018-10-22 DIAGNOSIS — Z7982 Long term (current) use of aspirin: Secondary | ICD-10-CM

## 2018-10-22 DIAGNOSIS — I959 Hypotension, unspecified: Secondary | ICD-10-CM | POA: Diagnosis not present

## 2018-10-22 DIAGNOSIS — R062 Wheezing: Secondary | ICD-10-CM

## 2018-10-22 DIAGNOSIS — E878 Other disorders of electrolyte and fluid balance, not elsewhere classified: Secondary | ICD-10-CM | POA: Diagnosis not present

## 2018-10-22 DIAGNOSIS — Z882 Allergy status to sulfonamides status: Secondary | ICD-10-CM

## 2018-10-22 DIAGNOSIS — Z515 Encounter for palliative care: Secondary | ICD-10-CM | POA: Diagnosis not present

## 2018-10-22 DIAGNOSIS — R29718 NIHSS score 18: Secondary | ICD-10-CM | POA: Diagnosis present

## 2018-10-22 DIAGNOSIS — Y92003 Bedroom of unspecified non-institutional (private) residence as the place of occurrence of the external cause: Secondary | ICD-10-CM

## 2018-10-22 DIAGNOSIS — I63411 Cerebral infarction due to embolism of right middle cerebral artery: Secondary | ICD-10-CM | POA: Diagnosis present

## 2018-10-22 DIAGNOSIS — J690 Pneumonitis due to inhalation of food and vomit: Secondary | ICD-10-CM | POA: Diagnosis not present

## 2018-10-22 DIAGNOSIS — Z7902 Long term (current) use of antithrombotics/antiplatelets: Secondary | ICD-10-CM

## 2018-10-22 DIAGNOSIS — I5021 Acute systolic (congestive) heart failure: Secondary | ICD-10-CM | POA: Diagnosis not present

## 2018-10-22 DIAGNOSIS — G8194 Hemiplegia, unspecified affecting left nondominant side: Secondary | ICD-10-CM | POA: Diagnosis present

## 2018-10-22 DIAGNOSIS — Z79899 Other long term (current) drug therapy: Secondary | ICD-10-CM

## 2018-10-22 DIAGNOSIS — I370 Nonrheumatic pulmonary valve stenosis: Secondary | ICD-10-CM | POA: Diagnosis not present

## 2018-10-22 DIAGNOSIS — R578 Other shock: Secondary | ICD-10-CM | POA: Diagnosis not present

## 2018-10-22 DIAGNOSIS — R29810 Facial weakness: Secondary | ICD-10-CM | POA: Diagnosis present

## 2018-10-22 DIAGNOSIS — I361 Nonrheumatic tricuspid (valve) insufficiency: Secondary | ICD-10-CM

## 2018-10-22 DIAGNOSIS — R911 Solitary pulmonary nodule: Secondary | ICD-10-CM | POA: Diagnosis present

## 2018-10-22 DIAGNOSIS — N179 Acute kidney failure, unspecified: Secondary | ICD-10-CM | POA: Diagnosis present

## 2018-10-22 DIAGNOSIS — F329 Major depressive disorder, single episode, unspecified: Secondary | ICD-10-CM | POA: Diagnosis present

## 2018-10-22 DIAGNOSIS — J9601 Acute respiratory failure with hypoxia: Secondary | ICD-10-CM | POA: Diagnosis not present

## 2018-10-22 DIAGNOSIS — I5022 Chronic systolic (congestive) heart failure: Secondary | ICD-10-CM | POA: Diagnosis present

## 2018-10-22 DIAGNOSIS — I639 Cerebral infarction, unspecified: Secondary | ICD-10-CM | POA: Diagnosis present

## 2018-10-22 DIAGNOSIS — R6521 Severe sepsis with septic shock: Secondary | ICD-10-CM | POA: Diagnosis not present

## 2018-10-22 DIAGNOSIS — A419 Sepsis, unspecified organism: Secondary | ICD-10-CM | POA: Diagnosis not present

## 2018-10-22 DIAGNOSIS — I130 Hypertensive heart and chronic kidney disease with heart failure and stage 1 through stage 4 chronic kidney disease, or unspecified chronic kidney disease: Secondary | ICD-10-CM | POA: Diagnosis present

## 2018-10-22 DIAGNOSIS — I080 Rheumatic disorders of both mitral and aortic valves: Secondary | ICD-10-CM | POA: Diagnosis present

## 2018-10-22 DIAGNOSIS — I480 Paroxysmal atrial fibrillation: Secondary | ICD-10-CM | POA: Diagnosis not present

## 2018-10-22 DIAGNOSIS — R4701 Aphasia: Secondary | ICD-10-CM | POA: Diagnosis present

## 2018-10-22 DIAGNOSIS — Z8249 Family history of ischemic heart disease and other diseases of the circulatory system: Secondary | ICD-10-CM

## 2018-10-22 DIAGNOSIS — J181 Lobar pneumonia, unspecified organism: Secondary | ICD-10-CM | POA: Diagnosis not present

## 2018-10-22 DIAGNOSIS — K219 Gastro-esophageal reflux disease without esophagitis: Secondary | ICD-10-CM | POA: Diagnosis present

## 2018-10-22 DIAGNOSIS — R414 Neurologic neglect syndrome: Secondary | ICD-10-CM | POA: Diagnosis present

## 2018-10-22 DIAGNOSIS — Z6830 Body mass index (BMI) 30.0-30.9, adult: Secondary | ICD-10-CM | POA: Diagnosis not present

## 2018-10-22 DIAGNOSIS — M1990 Unspecified osteoarthritis, unspecified site: Secondary | ICD-10-CM | POA: Diagnosis present

## 2018-10-22 DIAGNOSIS — Z452 Encounter for adjustment and management of vascular access device: Secondary | ICD-10-CM

## 2018-10-22 DIAGNOSIS — Z66 Do not resuscitate: Secondary | ICD-10-CM | POA: Diagnosis not present

## 2018-10-22 DIAGNOSIS — N183 Chronic kidney disease, stage 3 (moderate): Secondary | ICD-10-CM | POA: Diagnosis present

## 2018-10-22 DIAGNOSIS — D509 Iron deficiency anemia, unspecified: Secondary | ICD-10-CM | POA: Diagnosis present

## 2018-10-22 DIAGNOSIS — R4781 Slurred speech: Secondary | ICD-10-CM | POA: Diagnosis present

## 2018-10-22 DIAGNOSIS — E869 Volume depletion, unspecified: Secondary | ICD-10-CM | POA: Diagnosis not present

## 2018-10-22 DIAGNOSIS — E1122 Type 2 diabetes mellitus with diabetic chronic kidney disease: Secondary | ICD-10-CM | POA: Diagnosis present

## 2018-10-22 DIAGNOSIS — Z96652 Presence of left artificial knee joint: Secondary | ICD-10-CM

## 2018-10-22 DIAGNOSIS — F0390 Unspecified dementia without behavioral disturbance: Secondary | ICD-10-CM | POA: Diagnosis present

## 2018-10-22 DIAGNOSIS — R451 Restlessness and agitation: Secondary | ICD-10-CM | POA: Diagnosis not present

## 2018-10-22 DIAGNOSIS — K7200 Acute and subacute hepatic failure without coma: Secondary | ICD-10-CM | POA: Diagnosis not present

## 2018-10-22 DIAGNOSIS — D631 Anemia in chronic kidney disease: Secondary | ICD-10-CM | POA: Diagnosis present

## 2018-10-22 DIAGNOSIS — Z8041 Family history of malignant neoplasm of ovary: Secondary | ICD-10-CM

## 2018-10-22 DIAGNOSIS — E785 Hyperlipidemia, unspecified: Secondary | ICD-10-CM | POA: Diagnosis present

## 2018-10-22 DIAGNOSIS — F419 Anxiety disorder, unspecified: Secondary | ICD-10-CM | POA: Diagnosis present

## 2018-10-22 DIAGNOSIS — I6601 Occlusion and stenosis of right middle cerebral artery: Secondary | ICD-10-CM | POA: Diagnosis not present

## 2018-10-22 DIAGNOSIS — E669 Obesity, unspecified: Secondary | ICD-10-CM | POA: Diagnosis present

## 2018-10-22 DIAGNOSIS — Z888 Allergy status to other drugs, medicaments and biological substances status: Secondary | ICD-10-CM

## 2018-10-22 DIAGNOSIS — I272 Pulmonary hypertension, unspecified: Secondary | ICD-10-CM | POA: Diagnosis present

## 2018-10-22 DIAGNOSIS — Z9049 Acquired absence of other specified parts of digestive tract: Secondary | ICD-10-CM

## 2018-10-22 DIAGNOSIS — I444 Left anterior fascicular block: Secondary | ICD-10-CM | POA: Diagnosis present

## 2018-10-22 DIAGNOSIS — I63511 Cerebral infarction due to unspecified occlusion or stenosis of right middle cerebral artery: Secondary | ICD-10-CM | POA: Diagnosis present

## 2018-10-22 DIAGNOSIS — G894 Chronic pain syndrome: Secondary | ICD-10-CM | POA: Diagnosis present

## 2018-10-22 DIAGNOSIS — W06XXXA Fall from bed, initial encounter: Secondary | ICD-10-CM | POA: Diagnosis present

## 2018-10-22 DIAGNOSIS — I9589 Other hypotension: Secondary | ICD-10-CM | POA: Diagnosis not present

## 2018-10-22 DIAGNOSIS — R011 Cardiac murmur, unspecified: Secondary | ICD-10-CM | POA: Diagnosis present

## 2018-10-22 DIAGNOSIS — J189 Pneumonia, unspecified organism: Secondary | ICD-10-CM

## 2018-10-22 DIAGNOSIS — E872 Acidosis: Secondary | ICD-10-CM | POA: Diagnosis not present

## 2018-10-22 DIAGNOSIS — D72829 Elevated white blood cell count, unspecified: Secondary | ICD-10-CM | POA: Diagnosis not present

## 2018-10-22 DIAGNOSIS — I4891 Unspecified atrial fibrillation: Secondary | ICD-10-CM | POA: Diagnosis not present

## 2018-10-22 DIAGNOSIS — H9190 Unspecified hearing loss, unspecified ear: Secondary | ICD-10-CM | POA: Diagnosis present

## 2018-10-22 DIAGNOSIS — Z7401 Bed confinement status: Secondary | ICD-10-CM

## 2018-10-22 DIAGNOSIS — Z90710 Acquired absence of both cervix and uterus: Secondary | ICD-10-CM

## 2018-10-22 DIAGNOSIS — R471 Dysarthria and anarthria: Secondary | ICD-10-CM | POA: Diagnosis present

## 2018-10-22 HISTORY — PX: IR ANGIO VERTEBRAL SEL SUBCLAVIAN INNOMINATE UNI R MOD SED: IMG5365

## 2018-10-22 HISTORY — PX: IR PERCUTANEOUS ART THROMBECTOMY/INFUSION INTRACRANIAL INC DIAG ANGIO: IMG6087

## 2018-10-22 HISTORY — PX: IR CT HEAD LTD: IMG2386

## 2018-10-22 HISTORY — PX: RADIOLOGY WITH ANESTHESIA: SHX6223

## 2018-10-22 LAB — COMPREHENSIVE METABOLIC PANEL
ALBUMIN: 3 g/dL — AB (ref 3.5–5.0)
ALT: 9 U/L (ref 0–44)
AST: 21 U/L (ref 15–41)
Alkaline Phosphatase: 59 U/L (ref 38–126)
Anion gap: 11 (ref 5–15)
BUN: 19 mg/dL (ref 8–23)
CO2: 22 mmol/L (ref 22–32)
Calcium: 8.6 mg/dL — ABNORMAL LOW (ref 8.9–10.3)
Chloride: 105 mmol/L (ref 98–111)
Creatinine, Ser: 1.82 mg/dL — ABNORMAL HIGH (ref 0.44–1.00)
GFR calc Af Amer: 27 mL/min — ABNORMAL LOW (ref >60)
GFR calc non Af Amer: 24 mL/min — ABNORMAL LOW (ref >60)
Glucose, Bld: 134 mg/dL — ABNORMAL HIGH (ref 70–99)
POTASSIUM: 3.7 mmol/L (ref 3.5–5.1)
Sodium: 138 mmol/L (ref 135–145)
Total Bilirubin: 0.8 mg/dL (ref 0.3–1.2)
Total Protein: 6.8 g/dL (ref 6.5–8.1)

## 2018-10-22 LAB — I-STAT TROPONIN, ED: Troponin i, poc: 0.02 ng/mL (ref 0.00–0.08)

## 2018-10-22 LAB — TRIGLYCERIDES: Triglycerides: 154 mg/dL — ABNORMAL HIGH (ref <150)

## 2018-10-22 LAB — POCT I-STAT 3, ART BLOOD GAS (G3+)
Acid-base deficit: 1 mmol/L (ref 0.0–2.0)
Bicarbonate: 23.2 mmol/L (ref 20.0–28.0)
O2 Saturation: 100 %
TCO2: 24 mmol/L (ref 22–32)
pCO2 arterial: 34.1 mmHg (ref 32.0–48.0)
pH, Arterial: 7.44 (ref 7.350–7.450)
pO2, Arterial: 498 mmHg — ABNORMAL HIGH (ref 83.0–108.0)

## 2018-10-22 LAB — DIFFERENTIAL
Abs Immature Granulocytes: 0.04 10*3/uL (ref 0.00–0.07)
Basophils Absolute: 0.1 10*3/uL (ref 0.0–0.1)
Basophils Relative: 1 %
Eosinophils Absolute: 0.6 10*3/uL — ABNORMAL HIGH (ref 0.0–0.5)
Eosinophils Relative: 7 %
IMMATURE GRANULOCYTES: 1 %
Lymphocytes Relative: 20 %
Lymphs Abs: 1.7 10*3/uL (ref 0.7–4.0)
Monocytes Absolute: 0.7 10*3/uL (ref 0.1–1.0)
Monocytes Relative: 8 %
NEUTROS PCT: 63 %
Neutro Abs: 5.7 10*3/uL (ref 1.7–7.7)

## 2018-10-22 LAB — CBC
HCT: 44.4 % (ref 36.0–46.0)
Hemoglobin: 13.2 g/dL (ref 12.0–15.0)
MCH: 29.5 pg (ref 26.0–34.0)
MCHC: 29.7 g/dL — ABNORMAL LOW (ref 30.0–36.0)
MCV: 99.1 fL (ref 80.0–100.0)
PLATELETS: 259 10*3/uL (ref 150–400)
RBC: 4.48 MIL/uL (ref 3.87–5.11)
RDW: 14.6 % (ref 11.5–15.5)
WBC: 8.9 10*3/uL (ref 4.0–10.5)
nRBC: 0 % (ref 0.0–0.2)

## 2018-10-22 LAB — PROTIME-INR
INR: 1.05
Prothrombin Time: 13.6 seconds (ref 11.4–15.2)

## 2018-10-22 LAB — ECHOCARDIOGRAM COMPLETE
Height: 63.5 in
Weight: 2843.05 oz

## 2018-10-22 LAB — I-STAT CHEM 8, ED
BUN: 22 mg/dL (ref 8–23)
Calcium, Ion: 1.09 mmol/L — ABNORMAL LOW (ref 1.15–1.40)
Chloride: 103 mmol/L (ref 98–111)
Creatinine, Ser: 1.7 mg/dL — ABNORMAL HIGH (ref 0.44–1.00)
Glucose, Bld: 132 mg/dL — ABNORMAL HIGH (ref 70–99)
HCT: 42 % (ref 36.0–46.0)
HEMOGLOBIN: 14.3 g/dL (ref 12.0–15.0)
Potassium: 3.7 mmol/L (ref 3.5–5.1)
Sodium: 140 mmol/L (ref 135–145)
TCO2: 28 mmol/L (ref 22–32)

## 2018-10-22 LAB — MRSA PCR SCREENING: MRSA by PCR: NEGATIVE

## 2018-10-22 LAB — APTT: aPTT: 28 seconds (ref 24–36)

## 2018-10-22 LAB — CBG MONITORING, ED: GLUCOSE-CAPILLARY: 120 mg/dL — AB (ref 70–99)

## 2018-10-22 LAB — GLUCOSE, CAPILLARY
Glucose-Capillary: 120 mg/dL — ABNORMAL HIGH (ref 70–99)
Glucose-Capillary: 96 mg/dL (ref 70–99)

## 2018-10-22 SURGERY — IR WITH ANESTHESIA
Anesthesia: General

## 2018-10-22 MED ORDER — EPTIFIBATIDE 20 MG/10ML IV SOLN
INTRAVENOUS | Status: AC
  Filled 2018-10-22: qty 10

## 2018-10-22 MED ORDER — PHENTOLAMINE MESYLATE 5 MG IJ SOLR
10.0000 mg | Freq: Once | INTRAMUSCULAR | Status: AC
  Administered 2018-10-22: 5 mg via SUBCUTANEOUS
  Filled 2018-10-22: qty 10

## 2018-10-22 MED ORDER — SENNOSIDES-DOCUSATE SODIUM 8.6-50 MG PO TABS: 1.0000 | ORAL_TABLET | Freq: Every evening | ORAL | Status: DC | PRN

## 2018-10-22 MED ORDER — PHENYLEPHRINE HCL-NACL 10-0.9 MG/250ML-% IV SOLN
0.0000 ug/min | INTRAVENOUS | Status: DC
  Administered 2018-10-22: 200 ug/min via INTRAVENOUS
  Administered 2018-10-22: 180 ug/min via INTRAVENOUS
  Administered 2018-10-22 (×2): 130 ug/min via INTRAVENOUS
  Administered 2018-10-22: 150 ug/min via INTRAVENOUS
  Administered 2018-10-23: 160 ug/min via INTRAVENOUS
  Administered 2018-10-23: 300 ug/min via INTRAVENOUS
  Administered 2018-10-23 (×2): 200 ug/min via INTRAVENOUS
  Administered 2018-10-23: 170 ug/min via INTRAVENOUS
  Administered 2018-10-23: 180 ug/min via INTRAVENOUS
  Administered 2018-10-23: 160 ug/min via INTRAVENOUS
  Administered 2018-10-23 (×2): 13.333 ug/min via INTRAVENOUS
  Administered 2018-10-23: 180 ug/min via INTRAVENOUS
  Administered 2018-10-23: 200 ug/min via INTRAVENOUS
  Administered 2018-10-23: 180 ug/min via INTRAVENOUS
  Administered 2018-10-23: 160 ug/min via INTRAVENOUS
  Filled 2018-10-22 (×6): qty 250
  Filled 2018-10-22: qty 500
  Filled 2018-10-22: qty 250
  Filled 2018-10-22: qty 500
  Filled 2018-10-22 (×8): qty 250

## 2018-10-22 MED ORDER — EPHEDRINE SULFATE-NACL 50-0.9 MG/10ML-% IV SOSY
PREFILLED_SYRINGE | INTRAVENOUS | Status: DC | PRN
  Administered 2018-10-22: 5 mg via INTRAVENOUS

## 2018-10-22 MED ORDER — ROCURONIUM BROMIDE 10 MG/ML (PF) SYRINGE
PREFILLED_SYRINGE | INTRAVENOUS | Status: DC | PRN
  Administered 2018-10-22: 20 mg via INTRAVENOUS
  Administered 2018-10-22: 50 mg via INTRAVENOUS

## 2018-10-22 MED ORDER — IOPAMIDOL (ISOVUE-370) INJECTION 76%
50.0000 mL | Freq: Once | INTRAVENOUS | Status: AC | PRN
  Administered 2018-10-22: 50 mL via INTRAVENOUS

## 2018-10-22 MED ORDER — SODIUM CHLORIDE 0.9 % IV SOLN
INTRAVENOUS | Status: DC
  Administered 2018-10-22 – 2018-10-24 (×5): via INTRAVENOUS

## 2018-10-22 MED ORDER — ORAL CARE MOUTH RINSE
15.0000 mL | OROMUCOSAL | Status: DC
  Administered 2018-10-22 – 2018-10-26 (×30): 15 mL via OROMUCOSAL

## 2018-10-22 MED ORDER — FENTANYL CITRATE (PF) 250 MCG/5ML IJ SOLN
INTRAMUSCULAR | Status: DC | PRN
  Administered 2018-10-22 (×2): 25 ug via INTRAVENOUS

## 2018-10-22 MED ORDER — PANTOPRAZOLE SODIUM 40 MG IV SOLR
40.0000 mg | Freq: Every day | INTRAVENOUS | Status: DC
  Administered 2018-10-22 – 2018-10-25 (×4): 40 mg via INTRAVENOUS
  Filled 2018-10-22 (×5): qty 40

## 2018-10-22 MED ORDER — IOHEXOL 300 MG/ML  SOLN
300.0000 mL | Freq: Once | INTRAMUSCULAR | Status: AC | PRN
  Administered 2018-10-22: 60 mL via INTRA_ARTERIAL

## 2018-10-22 MED ORDER — ALTEPLASE (STROKE) FULL DOSE INFUSION
0.9000 mg/kg | Freq: Once | INTRAVENOUS | Status: AC
  Administered 2018-10-22: 72.5 mg via INTRAVENOUS
  Filled 2018-10-22: qty 100

## 2018-10-22 MED ORDER — NITROGLYCERIN 1 MG/10 ML FOR IR/CATH LAB
INTRA_ARTERIAL | Status: AC
  Filled 2018-10-22: qty 10

## 2018-10-22 MED ORDER — TICAGRELOR 90 MG PO TABS
ORAL_TABLET | ORAL | Status: AC
  Filled 2018-10-22: qty 2

## 2018-10-22 MED ORDER — STROKE: EARLY STAGES OF RECOVERY BOOK: Freq: Once | Status: DC

## 2018-10-22 MED ORDER — CEFAZOLIN SODIUM-DEXTROSE 2-3 GM-%(50ML) IV SOLR
INTRAVENOUS | Status: DC | PRN
  Administered 2018-10-22: 2 g via INTRAVENOUS

## 2018-10-22 MED ORDER — ASPIRIN 325 MG PO TABS
ORAL_TABLET | ORAL | Status: AC
  Filled 2018-10-22: qty 1

## 2018-10-22 MED ORDER — FENTANYL CITRATE (PF) 100 MCG/2ML IJ SOLN
50.0000 ug | INTRAMUSCULAR | Status: DC | PRN
  Administered 2018-10-22 – 2018-10-23 (×3): 50 ug via INTRAVENOUS
  Filled 2018-10-22 (×4): qty 2

## 2018-10-22 MED ORDER — DOCUSATE SODIUM 50 MG/5ML PO LIQD: 100.0000 mg | Freq: Two times a day (BID) | ORAL | Status: DC | PRN

## 2018-10-22 MED ORDER — ETOMIDATE 2 MG/ML IV SOLN
INTRAVENOUS | Status: AC | PRN
  Administered 2018-10-22: 20 mg via INTRAVENOUS

## 2018-10-22 MED ORDER — CLOPIDOGREL BISULFATE 300 MG PO TABS
ORAL_TABLET | ORAL | Status: AC
  Filled 2018-10-22: qty 1

## 2018-10-22 MED ORDER — CEFAZOLIN SODIUM-DEXTROSE 2-4 GM/100ML-% IV SOLN
INTRAVENOUS | Status: AC
  Filled 2018-10-22: qty 100

## 2018-10-22 MED ORDER — PHENYLEPHRINE 40 MCG/ML (10ML) SYRINGE FOR IV PUSH (FOR BLOOD PRESSURE SUPPORT)
PREFILLED_SYRINGE | INTRAVENOUS | Status: DC | PRN
  Administered 2018-10-22: 80 ug via INTRAVENOUS
  Administered 2018-10-22: 40 ug via INTRAVENOUS

## 2018-10-22 MED ORDER — SODIUM CHLORIDE 0.9 % IV SOLN
50.0000 mL | Freq: Once | INTRAVENOUS | Status: AC
  Administered 2018-10-22: 50 mL via INTRAVENOUS

## 2018-10-22 MED ORDER — CHLORHEXIDINE GLUCONATE 0.12% ORAL RINSE (MEDLINE KIT)
15.0000 mL | Freq: Two times a day (BID) | OROMUCOSAL | Status: DC
  Administered 2018-10-22 – 2018-10-28 (×9): 15 mL via OROMUCOSAL

## 2018-10-22 MED ORDER — SUCCINYLCHOLINE CHLORIDE 20 MG/ML IJ SOLN
INTRAMUSCULAR | Status: AC | PRN
  Administered 2018-10-22: 100 mg via INTRAVENOUS

## 2018-10-22 MED ORDER — PROPOFOL 500 MG/50ML IV EMUL
INTRAVENOUS | Status: DC | PRN
  Administered 2018-10-22: 50 ug/kg/min via INTRAVENOUS

## 2018-10-22 MED ORDER — FENTANYL CITRATE (PF) 100 MCG/2ML IJ SOLN
50.0000 ug | INTRAMUSCULAR | Status: DC | PRN
  Administered 2018-10-22 – 2018-10-23 (×3): 50 ug via INTRAVENOUS
  Filled 2018-10-22 (×2): qty 2

## 2018-10-22 MED ORDER — PROPOFOL 1000 MG/100ML IV EMUL
0.0000 ug/kg/min | INTRAVENOUS | Status: DC
  Administered 2018-10-22: 5 ug/kg/min via INTRAVENOUS
  Administered 2018-10-23 (×2): 30 ug/kg/min via INTRAVENOUS
  Filled 2018-10-22 (×3): qty 100

## 2018-10-22 MED ORDER — PROPOFOL 10 MG/ML IV BOLUS
INTRAVENOUS | Status: AC
  Administered 2018-10-22: 30 mg
  Filled 2018-10-22: qty 20

## 2018-10-22 MED ORDER — TIROFIBAN HCL IN NACL 5-0.9 MG/100ML-% IV SOLN
INTRAVENOUS | Status: AC
  Filled 2018-10-22: qty 100

## 2018-10-22 MED ORDER — CLEVIDIPINE BUTYRATE 0.5 MG/ML IV EMUL: 0.0000 mg/h | INTRAVENOUS | Status: DC

## 2018-10-22 MED ORDER — SODIUM CHLORIDE 0.9 % IV BOLUS
500.0000 mL | Freq: Once | INTRAVENOUS | Status: AC
  Administered 2018-10-22: 500 mL via INTRAVENOUS

## 2018-10-22 MED ORDER — SODIUM CHLORIDE 0.9 % IV SOLN
INTRAVENOUS | Status: DC | PRN
  Administered 2018-10-22: 15 ug/min via INTRAVENOUS

## 2018-10-22 MED ORDER — PROPOFOL 1000 MG/100ML IV EMUL
INTRAVENOUS | Status: AC
  Filled 2018-10-22: qty 100

## 2018-10-22 NOTE — Progress Notes (Signed)
PAtient taken to ICU by this nurse and CRNA. Patient stable at  transfer. Right groin checked, level 0. VSS.

## 2018-10-22 NOTE — Progress Notes (Signed)
Patient ID: Diane Mejia, female   DOB: August 09, 1923, 85 y.o.   MRN: [id number] INR. 8 Y RT H F LSW 9am today . Found by husband  With left sided weakness. Rt gaze preference and dysarthria . CT Brain . No ICH ASPECTS 10 RT MCA hyperdense sign. CTA Occluded RT MCA M1 seg. IV tpa given. Option of revascularization  of occluded RT MCA using the IRB protocol,given patients age and MRSS of 3?  D/W son and husband.. Procedure,risks reviewed.zRisks of ICH of 10 % ,worseing neuro deficit,vent dependency ,death ,inability to revascularize vascular injury discussed . Questions answered to their understanding. Informed witnessed consent obtained. S.Elie Leppo MD

## 2018-10-22 NOTE — Progress Notes (Signed)
eLink Physician-Brief Progress Note Patient Name: Diane Mejia DOB: 1923-01-10 MRN: [id number]   Date of Service  11/11/2018  HPI/Events of Note  Pt's neosynephrine IV noted to have infiltrated. Neosynephrine transferred to a different IV site.  eICU Interventions  Phentolamine ordered.     Intervention Category Major Interventions: Other:  Diane Mejia 10/19/2018, 8:04 PM

## 2018-10-22 NOTE — Progress Notes (Signed)
Spoke with MD Kipp Brood on the phone in regards to pt's low BP and agitation. Order placed to start neo starting at 171mcg/min and titrate according to response and initiate a 500 ml 0.9% saline bolus.

## 2018-10-22 NOTE — Progress Notes (Signed)
Patient's son Josph Macho called for update. Informed that patient remained on ventilator but was moving all extremities though was weaker on left. Also informed of neo synephrine extravasation to right arm.

## 2018-10-22 NOTE — Procedures (Signed)
S/P RT common carotid arteriogram folloed by complete revascularization of occluded RT M1 seg with x 2 passes with solitaire X 76mm x 40 mm retriever device achieving a TICI 3 revascularization

## 2018-10-22 NOTE — ED Notes (Signed)
Pt transferred to IR with stroke team, Marliss Czar, RN, Lexine Baton, [place] therapist. Care handed off to team.

## 2018-10-22 NOTE — Progress Notes (Signed)
  Echocardiogram 2D Echocardiogram has been performed.  Randa Lynn Arion Morgan 10/26/2018, 4:49 PM

## 2018-10-22 NOTE — Transfer of Care (Signed)
Immediate Anesthesia Transfer of Care Note  Patient: Diane Mejia  Procedure(s) Performed: IR WITH ANESTHESIA (N/A )  Patient Location: ICU  Anesthesia Type:General  Level of Consciousness: Patient remains intubated per anesthesia plan  Airway & Oxygen Therapy: Patient remains intubated per anesthesia plan and Patient placed on Ventilator (see vital sign flow sheet for setting)  Post-op Assessment: Report given to RN and Post -op Vital signs reviewed and stable  Post vital signs: Reviewed and stable  Last Vitals:  Vitals Value Taken Time  BP 110/64 (ABP 117/50) 10/17/2018  3:09 PM  Temp    Pulse 81 10/14/2018  3:17 PM  Resp 12 11/05/2018  3:17 PM  SpO2 99 % 11/08/2018  3:17 PM  Vitals shown include unvalidated device data.  Last Pain:  Vitals:   10/15/2018 1217  TempSrc: Axillary       Report to Claiborne Billings RN in ICU - patient remained intubated per anesthesia plan, RT at bedside, applied to ventilator support, SIMV 12/5 100% FiO2, ventilation confirmed. Full report given, questions answered, patient currently on propofol gtt.  Complications: No apparent anesthesia complications

## 2018-10-22 NOTE — Anesthesia Postprocedure Evaluation (Signed)
Anesthesia Post Note  Patient: Diane Mejia  Procedure(s) Performed: IR WITH ANESTHESIA (N/A )     Patient location during evaluation: ICU Anesthesia Type: General Level of consciousness: sedated and patient remains intubated per anesthesia plan Pain management: pain level controlled Vital Signs Assessment: post-procedure vital signs reviewed and stable Respiratory status: patient remains intubated per anesthesia plan Cardiovascular status: stable Postop Assessment: no apparent nausea or vomiting Anesthetic complications: no    Last Vitals:  Vitals:   11/05/2018 1255 10/30/2018 1300  BP: (!) 137/107 110/64  Pulse: 94 90  Resp: 17 18  Temp:    SpO2:  100%    Last Pain:  Vitals:   10/20/2018 1217  TempSrc: Axillary                 Audry Pili
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
## 2018-10-22 NOTE — Anesthesia Preprocedure Evaluation (Signed)
Anesthesia Evaluation  Preop documentation limited or incomplete due to emergent nature of procedure.  Airway Mallampati: Intubated       Dental   Pulmonary    Pulmonary exam normal        Cardiovascular hypertension, +CHF (combined left and right HF, EF 35-40 (2016))  Normal cardiovascular exam+ dysrhythmias Atrial Fibrillation + Valvular Problems/Murmurs MR      Neuro/Psych PSYCHIATRIC DISORDERS Depression CVA    GI/Hepatic   Endo/Other    Renal/GU      Musculoskeletal   Abdominal   Peds  Hematology   Anesthesia Other Findings Code stroke, arrived from ER intubated  Reproductive/Obstetrics                             Anesthesia Physical Anesthesia Plan  ASA: IV and emergent  Anesthesia Plan: General   Post-op Pain Management:    Induction: Inhalational  PONV Risk Score and Plan: 3 and Ondansetron, Dexamethasone and Treatment may vary due to age or medical condition  Airway Management Planned: Oral ETT  Additional Equipment: None  Intra-op Plan:   Post-operative Plan: Post-operative intubation/ventilation  Informed Consent:   History available from chart only and Only emergency history available  Plan Discussed with:   Anesthesia Plan Comments:         Anesthesia Quick Evaluation

## 2018-10-22 NOTE — Progress Notes (Signed)
Report called to Claiborne Billings, RN, ICU.

## 2018-10-22 NOTE — Progress Notes (Signed)
Pharmacist Code Stroke Response  Notified to mix tPA at 1153 by Dr. Lorraine Lax Delivered tPA to RN at 1157  tPA dose = 7.3mg  bolus over 1 minute followed by 65.2mg  for a total dose of 72.5mg  over 1 hour  Issues/delays encountered (if applicable): Needed to obtain consent from family  Diane Mejia, [name] 10/16/2018 11:54 AM

## 2018-10-22 NOTE — Progress Notes (Signed)
eLink Physician-Brief Progress Note Patient Name: Diane Mejia DOB: 06-Feb-1923 MRN: [id number]   Date of Service  11/13/2018  HPI/Events of Note  Pt with no urine output.  Bladder scan reveals more than 343ml.  eICU Interventions  Straight cath ordered.     Intervention Category Intermediate Interventions: Other:  Elsie Lincoln 11/10/2018, 10:58 PM

## 2018-10-22 NOTE — ED Triage Notes (Signed)
Pt seen ambulating at home in bedroom with husband around [place] a fall at 9am, unwitnessed, follows commands, not moving left side with right facial droop. Code stroke activated. 18g LAC, 18g R FA. CBG 162.

## 2018-10-22 NOTE — Progress Notes (Signed)
Patient does not meet criteria for catheter placement due to TPA infusing at this time.

## 2018-10-22 NOTE — Code Documentation (Signed)
Pt being bagged by RT

## 2018-10-22 NOTE — ED Notes (Signed)
ED physician preparing to intubate patient.

## 2018-10-22 NOTE — Progress Notes (Signed)
Patient ID: Diane Mejia, female   DOB: 1923/09/12, 85 y.o.   MRN: [id number] INR. Post treatment CT of brain NO Gross ICH ,mass effect or midline shift. Probable contrast strain in the RT BG region. Pupils 2 mm equal. Patient leftv intubated. RT groin soft. Distal pulses RT DP 2+limited DP + Both PTs non dopplerable (unchanged.) SDeveshwar MD
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
## 2018-10-22 NOTE — Code Documentation (Signed)
85yo female arriving to [facility] via [place] at 76. Patient from home where she was reportedly up and walking about the house at 0800. Around 0900 she went back to bed which is not unusual for her. Later her husband heard her fall out of bed. EMS called out for a fall and assessed left sided weakness and activated a code stroke. Stroke team at the bedside on patient arrival. Labs drawn and patient cleared for CT by Dr. Dayna Barker. Patient to CT with team. CT followed by CTA completed. CTA showing embolic occlusion of the right M1 segment. NIHSS 18, see documentation for details and code stroke times. Patient with right gaze preference but can cross midline to the left, no blink to threat on the left, left facial droop, left hemiparesis (arm>leg) and dysarthria on exam. Patient has difficulty following commands, however, answers questions appropriately. She is extremely hard of hearing. She has a history of dementia and atrial fibrillation not on anticoagulation. Patient transported back to the ED. Decision to proceed with tPA after multiple discussions with family. 7.3mg  tPA bolus given over 1 minute at 1204 followed by 65.2mg /hr for a total of 72.5mg  per pharmacy dosing. Patient with some improvement in LUE weakness. Patient reporting headache and lower abdominal/groin pain from fall. Dr. Lorraine Lax made aware. Additional conversation with patient's family regarding endovascular intervention. Family agreeable to proceed. IR notified, however, patient currently on the IR table. Patient frequently monitored in the ED by staff and prepped for IR in the ED including intubated by the EDP. Patient then transported to IR, handoff with IR RN.

## 2018-10-22 NOTE — Anesthesia Procedure Notes (Signed)
Arterial Line Insertion Start/End01/26/2020 1:10 PM, 10/22/2018 1:20 PM Performed by: Wilburn Cornelia, CRNA, CRNA  Patient location: OOR procedure area. Emergency situation Left, radial was placed Catheter size: 20 G Hand hygiene performed , maximum sterile barriers used  and Seldinger technique used Allen's test indicative of satisfactory collateral circulation Attempts: 3 Procedure performed without using ultrasound guided technique. Following insertion, dressing applied. Post procedure complications: local hematoma.

## 2018-10-23 ENCOUNTER — Inpatient Hospital Stay (HOSPITAL_COMMUNITY): Payer: Medicare Other

## 2018-10-23 ENCOUNTER — Encounter (HOSPITAL_COMMUNITY): Payer: Self-pay | Admitting: Interventional Radiology

## 2018-10-23 DIAGNOSIS — R578 Other shock: Secondary | ICD-10-CM

## 2018-10-23 DIAGNOSIS — I63511 Cerebral infarction due to unspecified occlusion or stenosis of right middle cerebral artery: Secondary | ICD-10-CM

## 2018-10-23 LAB — BASIC METABOLIC PANEL
ANION GAP: 8 (ref 5–15)
BUN: 18 mg/dL (ref 8–23)
CO2: 17 mmol/L — ABNORMAL LOW (ref 22–32)
Calcium: 7.1 mg/dL — ABNORMAL LOW (ref 8.9–10.3)
Chloride: 116 mmol/L — ABNORMAL HIGH (ref 98–111)
Creatinine, Ser: 1.5 mg/dL — ABNORMAL HIGH (ref 0.44–1.00)
GFR calc Af Amer: 34 mL/min — ABNORMAL LOW (ref >60)
GFR calc non Af Amer: 30 mL/min — ABNORMAL LOW (ref >60)
Glucose, Bld: 106 mg/dL — ABNORMAL HIGH (ref 70–99)
Potassium: 3.4 mmol/L — ABNORMAL LOW (ref 3.5–5.1)
Sodium: 141 mmol/L (ref 135–145)

## 2018-10-23 LAB — URINALYSIS, ROUTINE W REFLEX MICROSCOPIC
Bilirubin Urine: NEGATIVE
Glucose, UA: NEGATIVE mg/dL
Hgb urine dipstick: NEGATIVE
Ketones, ur: NEGATIVE mg/dL
Nitrite: NEGATIVE
Protein, ur: 30 mg/dL — AB
Specific Gravity, Urine: 1.04 — ABNORMAL HIGH (ref 1.005–1.030)
pH: 5 (ref 5.0–8.0)

## 2018-10-23 LAB — GLUCOSE, CAPILLARY
GLUCOSE-CAPILLARY: 97 mg/dL (ref 70–99)
GLUCOSE-CAPILLARY: 102 mg/dL — AB (ref 70–99)
Glucose-Capillary: 92 mg/dL (ref 70–99)
Glucose-Capillary: 97 mg/dL (ref 70–99)
Glucose-Capillary: 105 mg/dL — ABNORMAL HIGH (ref 70–99)
Glucose-Capillary: 87 mg/dL (ref 70–99)

## 2018-10-23 LAB — CBC
HCT: 30.8 % — ABNORMAL LOW (ref 36.0–46.0)
Hemoglobin: 9.7 g/dL — ABNORMAL LOW (ref 12.0–15.0)
MCH: 31.2 pg (ref 26.0–34.0)
MCHC: 31.5 g/dL (ref 30.0–36.0)
MCV: 99 fL (ref 80.0–100.0)
Platelets: 282 10*3/uL (ref 150–400)
RBC: 3.11 MIL/uL — AB (ref 3.87–5.11)
RDW: 15 % (ref 11.5–15.5)
WBC: 14.6 10*3/uL — ABNORMAL HIGH (ref 4.0–10.5)
nRBC: 0 % (ref 0.0–0.2)

## 2018-10-23 LAB — HEMOGLOBIN AND HEMATOCRIT, BLOOD
HCT: 36 % (ref 36.0–46.0)
HEMATOCRIT: 29 % — AB (ref 36.0–46.0)
Hemoglobin: 8.8 g/dL — ABNORMAL LOW (ref 12.0–15.0)
Hemoglobin: 10.9 g/dL — ABNORMAL LOW (ref 12.0–15.0)

## 2018-10-23 LAB — LIPID PANEL
Cholesterol: 127 mg/dL (ref 0–200)
HDL: 24 mg/dL — ABNORMAL LOW (ref >40)
LDL Cholesterol: 58 mg/dL (ref 0–99)
TRIGLYCERIDES: 224 mg/dL — AB (ref <150)
Total CHOL/HDL Ratio: 5.3 RATIO
VLDL: 45 mg/dL — ABNORMAL HIGH (ref 0–40)

## 2018-10-23 LAB — MAGNESIUM: Magnesium: 1.3 mg/dL — ABNORMAL LOW (ref 1.7–2.4)

## 2018-10-23 LAB — PROCALCITONIN: PROCALCITONIN: 0.27 ng/mL

## 2018-10-23 LAB — HEMOGLOBIN A1C
Hgb A1c MFr Bld: 5.6 % (ref 4.8–5.6)
Mean Plasma Glucose: 114.02 mg/dL

## 2018-10-23 LAB — PREPARE RBC (CROSSMATCH)

## 2018-10-23 MED ORDER — MAGNESIUM SULFATE 2 GM/50ML IV SOLN
2.0000 g | Freq: Once | INTRAVENOUS | Status: AC
  Administered 2018-10-23: 2 g via INTRAVENOUS
  Filled 2018-10-23: qty 50

## 2018-10-23 MED ORDER — MIDAZOLAM HCL 2 MG/2ML IJ SOLN
INTRAMUSCULAR | Status: AC
  Filled 2018-10-23: qty 2

## 2018-10-23 MED ORDER — FENTANYL 2500MCG IN NS 250ML (10MCG/ML) PREMIX INFUSION
25.0000 ug/h | INTRAVENOUS | Status: DC
  Administered 2018-10-23: 25 ug/h via INTRAVENOUS
  Filled 2018-10-23 (×2): qty 250

## 2018-10-23 MED ORDER — ASPIRIN EC 325 MG PO TBEC
325.0000 mg | DELAYED_RELEASE_TABLET | Freq: Every day | ORAL | Status: DC
  Filled 2018-10-23: qty 1

## 2018-10-23 MED ORDER — METOPROLOL TARTRATE 5 MG/5ML IV SOLN
2.5000 mg | INTRAVENOUS | Status: DC | PRN
  Administered 2018-10-24 – 2018-10-25 (×3): 5 mg via INTRAVENOUS
  Administered 2018-10-25: 2.5 mg via INTRAVENOUS
  Filled 2018-10-23 (×5): qty 5

## 2018-10-23 MED ORDER — SODIUM CHLORIDE 0.9% FLUSH: 10.0000 mL | INTRAVENOUS | Status: DC | PRN

## 2018-10-23 MED ORDER — MIDAZOLAM HCL 2 MG/2ML IJ SOLN
1.0000 mg | INTRAMUSCULAR | Status: AC | PRN
  Administered 2018-10-23 – 2018-10-24 (×3): 1 mg via INTRAVENOUS
  Filled 2018-10-23 (×2): qty 2

## 2018-10-23 MED ORDER — FENTANYL CITRATE (PF) 100 MCG/2ML IJ SOLN: 50.0000 ug | Freq: Once | INTRAMUSCULAR | Status: AC

## 2018-10-23 MED ORDER — MIDAZOLAM HCL 2 MG/2ML IJ SOLN
1.0000 mg | INTRAMUSCULAR | Status: DC | PRN
  Administered 2018-10-24 (×2): 1 mg via INTRAVENOUS
  Filled 2018-10-23 (×3): qty 2

## 2018-10-23 MED ORDER — PHENYLEPHRINE HCL-NACL 40-0.9 MG/250ML-% IV SOLN
0.0000 ug/min | INTRAVENOUS | Status: DC
  Administered 2018-10-23: 300 ug/min via INTRAVENOUS
  Administered 2018-10-23: 210 ug/min via INTRAVENOUS
  Administered 2018-10-23: 300 ug/min via INTRAVENOUS
  Administered 2018-10-23: 225 ug/min via INTRAVENOUS
  Administered 2018-10-24: 210 ug/min via INTRAVENOUS
  Administered 2018-10-24: 310 ug/min via INTRAVENOUS
  Administered 2018-10-24: 290 ug/min via INTRAVENOUS
  Administered 2018-10-24: 230 ug/min via INTRAVENOUS
  Administered 2018-10-24: 210 ug/min via INTRAVENOUS
  Administered 2018-10-24: 280 ug/min via INTRAVENOUS
  Administered 2018-10-25: 230 ug/min via INTRAVENOUS
  Administered 2018-10-25: 275 ug/min via INTRAVENOUS
  Administered 2018-10-25 (×2): 310 ug/min via INTRAVENOUS
  Administered 2018-10-25: 250 ug/min via INTRAVENOUS
  Administered 2018-10-25: 240 ug/min via INTRAVENOUS
  Administered 2018-10-25: 260 ug/min via INTRAVENOUS
  Administered 2018-10-25: 250 ug/min via INTRAVENOUS
  Administered 2018-10-25 – 2018-10-26 (×4): 275 ug/min via INTRAVENOUS
  Filled 2018-10-23 (×28): qty 250

## 2018-10-23 MED ORDER — ASPIRIN 300 MG RE SUPP
300.0000 mg | Freq: Every day | RECTAL | Status: DC
  Administered 2018-10-23 – 2018-10-25 (×3): 300 mg via RECTAL
  Filled 2018-10-23 (×4): qty 1

## 2018-10-23 MED ORDER — FENTANYL BOLUS VIA INFUSION
25.0000 ug | INTRAVENOUS | Status: DC | PRN
  Filled 2018-10-23: qty 25

## 2018-10-23 MED ORDER — CHLORHEXIDINE GLUCONATE CLOTH 2 % EX PADS
6.0000 | MEDICATED_PAD | Freq: Every day | CUTANEOUS | Status: DC
  Administered 2018-10-23 – 2018-10-25 (×2): 6 via TOPICAL

## 2018-10-23 MED ORDER — SODIUM CHLORIDE 0.9% FLUSH
10.0000 mL | Freq: Two times a day (BID) | INTRAVENOUS | Status: DC
  Administered 2018-10-23 – 2018-10-24 (×2): 10 mL
  Administered 2018-10-24: 30 mL
  Administered 2018-10-25 (×2): 10 mL

## 2018-10-23 MED ORDER — SODIUM CHLORIDE 0.9% IV SOLUTION: Freq: Once | INTRAVENOUS | Status: AC

## 2018-10-23 MED ORDER — SODIUM CHLORIDE 0.9 % IV BOLUS
500.0000 mL | Freq: Once | INTRAVENOUS | Status: AC
  Administered 2018-10-23: 500 mL via INTRAVENOUS

## 2018-10-23 MED ORDER — FENTANYL CITRATE (PF) 100 MCG/2ML IJ SOLN
INTRAMUSCULAR | Status: AC
  Filled 2018-10-23: qty 2

## 2018-10-23 NOTE — Progress Notes (Signed)
Referring Physician(s): CODE STROKE- Aroor, Lanice Schwab  Supervising Physician: Luanne Bras  Patient Status:  [facility] - In-pt  Chief Complaint: None  Subjective:  Right MCA M1 segment occlusion s/p emergent mechanical thrombectomy achieving a TICI 3 revascularization 11/03/2018 by Dr. Estanislado Pandy. Patient laying in bed intubated and sedated. RN reports spontaneous movement of all extremities with sedation turned down. Right groin incision c/d/i.   Allergies: Dilaudid [hydromorphone hcl] and Sulfa antibiotics  Medications: Prior to Admission medications   Medication Sig Start Date End Date Taking? Authorizing Provider  clopidogrel (PLAVIX) 75 MG tablet Take 75 mg by mouth daily.  06/09/17  Yes [provider]  FLUoxetine (PROZAC) 10 MG tablet Take 1 tablet (10 mg total) by mouth daily. Office visit needed for refills 03/06/18  Yes McVey, Gelene Mink, PA-C  furosemide (LASIX) 40 MG tablet Take 40 mg by mouth 2 (two) times daily.  03/12/17  Yes [provider]  metoprolol succinate (TOPROL-XL) 50 MG 24 hr tablet Take 50 mg by mouth daily. Take with or immediately following a meal.    Yes [provider]  Vitamin D, Cholecalciferol, 10 MCG (400 UNIT) TABS Take 400 Units by mouth daily.   Yes [provider]  aspirin EC 81 MG tablet Take 81 mg by mouth at bedtime.    [provider]  promethazine (PHENERGAN) 12.5 MG tablet Take 12.5 mg by mouth every 8 (eight) hours as needed for nausea.    [provider]  sertraline (ZOLOFT) 50 MG tablet Take 25-50 mg by mouth See admin instructions. Take 1/2 tablet (25mg ) by mouth daily for 7 days, then 1 tablet (50mg ) daily for 23 days    [provider]     Vital Signs: BP 109/66   Pulse 92   Temp 97.7 F (36.5 C) (Axillary)   Resp 17   Ht 5' 3.5" (1.613 m)   Wt 177 lb 11.1 oz (80.6 kg)   SpO2 100%   BMI 30.98 kg/m   Physical Exam Vitals signs and nursing note  reviewed.  Constitutional:      General: She is not in acute distress.    Appearance: Normal appearance.     Comments: Intubated and sedated.  Pulmonary:     Effort: Pulmonary effort is normal. No respiratory distress.     Comments: Intubated and sedated. Skin:    General: Skin is warm and dry.     Comments: Right groin incision soft without active bleeding or hematoma.  Neurological:     Comments: Intubated and sedated. Speech and comprehension not assessed due to intubation/sedation. PERRL bilaterally. EOMs not assessed. Visual fields not assessed. Facial asymmetry not assessed due to ET tube. Tongue protrusion not assessed due to ET tube.  RN reports spontaneous movements of all extremities with sedation turned down. Pronator drift not assessed. Fine motor and coordination not assessed. Gait not assessed. Romberg not assessed. Heel to toe not assessed. Distal pulses 1+ bilaterally.  Psychiatric:     Comments: Intubated and sedated.     Imaging: Ct Angio Head W Or Wo Contrast  Result Date:  CLINICAL DATA:  Acute presentation with left-sided weakness. Hyperdense right middle cerebral artery. EXAM: CT ANGIOGRAPHY HEAD AND NECK TECHNIQUE: Multidetector CT imaging of the head and neck was performed using the standard protocol during bolus administration of intravenous contrast. Multiplanar CT image reconstructions and MIPs were obtained to evaluate the vascular anatomy. Carotid stenosis measurements (when applicable) are obtained utilizing NASCET criteria, using the distal  internal carotid diameter as the denominator. CONTRAST:  76mL ISOVUE-370 IOPAMIDOL (ISOVUE-370) INJECTION 76% COMPARISON:  Head CT earlier same day FINDINGS: CTA NECK FINDINGS Aortic arch: Aortic atherosclerosis. Brachiocephalic vessel origins widely patent. Right carotid system: Common carotid artery tortuous but widely patent to the bifurcation. Soft and calcified plaque at the carotid bifurcation and ICA  bulb. No ICA stenosis. Left carotid system: Common carotid artery widely patent to the bifurcation region. Minimal atherosclerotic plaque at the bifurcation. No ICA stenosis. Vertebral arteries: Both vertebral arteries are widely patent at their origins and through the cervical region to the foramen magnum. Skeleton: Advanced cervical spondylosis. Other neck: No mass or lymphadenopathy. Upper chest: Pulmonary fibrotic markings. Pericardial effusion. Question 1 cm mass in the right upper lobe. Review of the MIP images confirms the above findings CTA HEAD FINDINGS Anterior circulation: Both internal carotid arteries are patent through the skull base and siphon regions. No stenosis. The left anterior and middle cerebral vessels are widely patent and normal. On the right, the anterior cerebral division is widely patent and normal. There is occlusion of the M1 segment due to an embolus immediately beyond its origin. Some distal vessel collateral opacification is noted. Posterior circulation: Both vertebral arteries are widely patent through the foramen magnum to the basilar. No basilar stenosis. Mild bulbous configuration of the distal basilar artery without a true berry aneurysm. Venous sinuses: Patent and normal. Anatomic variants: None significant. Delayed phase: No abnormal enhancement. Review of the MIP images confirms the above findings IMPRESSION: Embolic occlusion of the right M1 segment. Minimal atherosclerotic change at the carotid bifurcations without stenosis. These results were communicated to Dr. Lorraine Lax at 12:17 pmon 01/19/2020by text page via the [facility] messaging system. Pulmonary fibrotic pattern.  Question 1 cm nodule right upper lobe. Electronically Signed   By: Nelson Chimes M.D.   On: 11/11/2018 12:23   Ct Angio Neck W Or Wo Contrast  Result Date: 10/22/2018 CLINICAL DATA:  Acute presentation with left-sided weakness. Hyperdense right middle cerebral artery. EXAM: CT ANGIOGRAPHY HEAD AND NECK TECHNIQUE:  Multidetector CT imaging of the head and neck was performed using the standard protocol during bolus administration of intravenous contrast. Multiplanar CT image reconstructions and MIPs were obtained to evaluate the vascular anatomy. Carotid stenosis measurements (when applicable) are obtained utilizing NASCET criteria, using the distal internal carotid diameter as the denominator. CONTRAST:  65mL ISOVUE-370 IOPAMIDOL (ISOVUE-370) INJECTION 76% COMPARISON:  Head CT earlier same day FINDINGS: CTA NECK FINDINGS Aortic arch: Aortic atherosclerosis. Brachiocephalic vessel origins widely patent. Right carotid system: Common carotid artery tortuous but widely patent to the bifurcation. Soft and calcified plaque at the carotid bifurcation and ICA bulb. No ICA stenosis. Left carotid system: Common carotid artery widely patent to the bifurcation region. Minimal atherosclerotic plaque at the bifurcation. No ICA stenosis. Vertebral arteries: Both vertebral arteries are widely patent at their origins and through the cervical region to the foramen magnum. Skeleton: Advanced cervical spondylosis. Other neck: No mass or lymphadenopathy. Upper chest: Pulmonary fibrotic markings. Pericardial effusion. Question 1 cm mass in the right upper lobe. Review of the MIP images confirms the above findings CTA HEAD FINDINGS Anterior circulation: Both internal carotid arteries are patent through the skull base and siphon regions. No stenosis. The left anterior and middle cerebral vessels are widely patent and normal. On the right, the anterior cerebral division is widely patent and normal. There is occlusion of the M1 segment due to an embolus immediately beyond its origin. Some distal vessel collateral opacification is  noted. Posterior circulation: Both vertebral arteries are widely patent through the foramen magnum to the basilar. No basilar stenosis. Mild bulbous configuration of the distal basilar artery without a true berry aneurysm.  Venous sinuses: Patent and normal. Anatomic variants: None significant. Delayed phase: No abnormal enhancement. Review of the MIP images confirms the above findings IMPRESSION: Embolic occlusion of the right M1 segment. Minimal atherosclerotic change at the carotid bifurcations without stenosis. These results were communicated to Dr. Lorraine Lax at 12:17 pmon 01/04/2020by text page via the [facility] messaging system. Pulmonary fibrotic pattern.  Question 1 cm nodule right upper lobe. Electronically Signed   By: Nelson Chimes M.D.   On: 11/07/2018 12:23   Dg Chest Portable 1 View  Result Date:  CLINICAL DATA:  Post intubation EXAM: PORTABLE CHEST 1 VIEW COMPARISON:  Portable exam 1516 hours FINDINGS: Tip of endotracheal tube projects 2.3 cm above carina. Enlargement of cardiac silhouette. Mediastinal contours and pulmonary vascularity normal. Large hiatal hernia seen on previous exam not demonstrated on current study. Atherosclerotic calcification aorta. Chronic interstitial disease changes again identified, progressive since prior study. Difficult to completely exclude superimposed acute infiltrates in the mid to lower lungs bilaterally though this could represent progression of chronic interstitial disease. No pleural effusion or pneumothorax. Bones demineralized. IMPRESSION: Enlargement of cardiac silhouette. Diffuse interstitial infiltrates, potentially due to progression of chronic interstitial lung disease though can not exclude acute superimposed infiltrates at the lower lungs. Electronically Signed   By: Lavonia Dana M.D.   On: 10/29/2018 15:27   Ct Head Code Stroke Wo Contrast  Addendum Date: 11/10/2018   ADDENDUM REPORT: 10/20/2018 11:59 ADDENDUM: These results were called by telephone at the time of interpretation on 10/21/2018 at 11:55 am to Dr. Lorraine Lax, who verbally acknowledged these results. Electronically Signed   By: Franchot Gallo M.D.   On: 11/07/2018 11:59   Result Date: 10/27/2018 CLINICAL DATA:   Code stroke.  Left-sided weakness EXAM: CT HEAD WITHOUT CONTRAST TECHNIQUE: Contiguous axial images were obtained from the base of the skull through the vertex without intravenous contrast. COMPARISON:  CT head 10/31/2015 FINDINGS: Brain: Moderate to advanced atrophy. Chronic white matter changes stable from the prior study and moderate in degree. Negative for acute infarct, hemorrhage, mass.  No midline shift. Vascular: Hyperdense right MCA may respect reflect thrombosis. CTA recommended. Skull: Negative Sinuses/Orbits: Mild mucosal edema left sphenoid sinus. Otherwise sinuses are clear. Negative orbit Other: None ASPECTS ([place] Stroke Program Early CT Score) - Ganglionic level infarction (caudate, lentiform nuclei, internal capsule, insula, M1-M3 cortex): 7 - Supraganglionic infarction (M4-M6 cortex): 3 Total score (0-10 with 10 being normal): 10 IMPRESSION: 1. Hyperdense right MCA suggesting thrombosis. CTA recommended for further evaluation 2. No evidence of acute infarct or hemorrhage 3. ASPECTS is 10 Electronically Signed: By: Franchot Gallo M.D. On: 11/05/2018 11:53    Labs:  CBC: Recent Labs    10/24/2018 1144 10/27/2018 1146 10/23/18 0550  WBC 8.9  --  14.6*  HGB 13.2 14.3 9.7*  HCT 44.4 42.0 30.8*  PLT 259  --  282    COAGS: Recent Labs    10/14/2018 1144  INR 1.05  APTT 28    BMP: Recent Labs    11/01/2018 1144 10/22/18 1146 10/23/18 0550  NA 138 140 141  K 3.7 3.7 3.4*  CL 105 103 116*  CO2 22  --  17*  GLUCOSE 134* 132* 106*  BUN 19 22 18   CALCIUM 8.6*  --  7.1*  CREATININE 1.82* 1.70* 1.50*  GFRNONAA 24*  --  30*  GFRAA 27*  --  34*    LIVER FUNCTION TESTS: Recent Labs    11/05/2018 1144  BILITOT 0.8  AST 21  ALT 9  ALKPHOS 59  PROT 6.8  ALBUMIN 3.0*    Assessment and Plan:  Right MCA M1 segment occlusion s/p emergent mechanical thrombectomy achieving a TICI 3 revascularization 10/21/2018 by Dr. Estanislado Pandy. Patient's condition stable- she remains  intubated/sedated, RN reports spontaneous movement of all extremities with sedation turned down. Right groin incision stable. Extubation plans per CCM. Appreciate and agree with neurology and CCM management. IR to follow.   Electronically Signed: Earley Abide, PA-C 10/23/2018, 9:13 AM   I spent a total of 25 Minutes at the the patient's bedside AND on the patient's hospital floor or unit, greater than 50% of which was counseling/coordinating care for right MCA M1 segment occlusion s/p revascularization.

## 2018-10-23 NOTE — Procedures (Signed)
Central Venous Catheter Insertion Procedure Note KALEIA LONGHI [id number] 07-30-1923  Procedure: Insertion of Central Venous Catheter Indications: Assessment of intravascular volume, Drug and/or fluid administration and Frequent blood sampling  Procedure Details Consent: Risks of procedure as well as the alternatives and risks of each were explained to the (patient/caregiver).  Consent for procedure obtained. Time Out: Verified patient identification, verified procedure, site/side was marked, verified correct patient position, special equipment/implants available, medications/allergies/relevent history reviewed, required imaging and test results available.  Performed  Maximum sterile technique was used including antiseptics, cap, gloves, gown, hand hygiene, mask and sheet. Skin prep: Chlorhexidine; local anesthetic administered A antimicrobial bonded/coated triple lumen catheter was placed in the right internal jugular vein using the Seldinger technique. Ultrasound guidance used.Yes.   Catheter placed to 20 cm. Blood aspirated via all 3 ports and then flushed x 3. Line sutured x 2 and dressing applied.  Evaluation Blood flow good Complications: No apparent complications Patient did tolerate procedure well. Chest X-ray ordered to verify placement.  CXR: pending.  Rt IJV  Richardson Landry Yatzari Jonsson ACNP Maryanna Shape PCCM Pager [phone] till 1 pm If no answer page [phone] 10/23/2018, 1:01 PM

## 2018-10-23 NOTE — Progress Notes (Addendum)
Initial Nutrition Assessment  DOCUMENTATION CODES:   Not applicable  INTERVENTION:   If pt unable to extubated in 24-48 hrs:  Vital AF 1.2 @ 45 ml/hr via OGT (1080 ml) 30 ml Prostat once daily  Provides: 1396 kcals, 96 grams protein, 876 ml free water.   Monitor magnesium, potassium, and phosphorus daily for at least 3 days, MD to replete as needed, as pt is at risk for refeeding syndrome.   NUTRITION DIAGNOSIS:   Inadequate oral intake related to inability to eat as evidenced by NPO status.  GOAL:   Patient will meet greater than or equal to 90% of their needs  MONITOR:   Weight trends, Diet advancement, Vent status, TF tolerance, Labs, I & O's  REASON FOR ASSESSMENT:   Ventilator    ASSESSMENT:   Patient with PMH significant for Afib, HTN, hard of hearing, CHF, and dementia. Presents this admission with R M1 occlusion s/p TPA.    1/9- thrombectomy, complete revascularization   Pt discussed during ICU rounds and with RN. Originally planned for extubation today, but pt became hemodynamically unstable after MRI. Concern for retroperitoneal bleed.   No family at bedside to provide nutrition history. Per H&P, pt capable of performing ADLs. Weight history limited. Will use weight of 80.6 kg to estimated needs, suspect some fluid accumulation.   Patient is currently intubated on ventilator support MV: 7.9 L/min Temp (24hrs), Avg:97.4 F (36.3 C), Min:94.9 F (34.9 C), Max:98.2 F (36.8 C) BP: 111/88 MAP: 94 Propofol: discontinued  I/O: +6951 ml since admit UOP: 550 ml x 24 hrs   Medications reviewed and include: NS @ 50 ml/hr, neosynephrine Labs reviewed: K 3.4 (L) Mg 1.3 (L)   NUTRITION - FOCUSED PHYSICAL EXAM:    Most Recent Value  Orbital Region  No depletion  Upper Arm Region  No depletion  Thoracic and Lumbar Region  Unable to assess  Buccal Region  No depletion  Temple Region  Mild depletion  Clavicle Bone Region  No depletion  Clavicle and Acromion  Bone Region  No depletion  Scapular Bone Region  Unable to assess  Dorsal Hand  Unable to assess [mits]  Patellar Region  No depletion  Anterior Thigh Region  No depletion  Posterior Calf Region  No depletion  Edema (RD Assessment)  Mild     Diet Order:   Diet Order            Diet NPO time specified  Diet effective now              EDUCATION NEEDS:   Not appropriate for education at this time  Skin:  Skin Assessment: Reviewed RN Assessment  Last BM:  PTA  Height:   Ht Readings from Last 1 Encounters:  10/30/2018 5' 3.5" (1.613 m)    Weight:   Wt Readings from Last 1 Encounters:  10/14/2018 80.6 kg    Ideal Body Weight:  54.5 kg  BMI:  Body mass index is 30.98 kg/m.  Estimated Nutritional Needs:   Kcal:  1350 kcal  Protein:  85-100 grams  Fluid:  >/= 1.3 L/day    Mariana Single RD, LDN Clinical Nutrition Pager # - [phone]

## 2018-10-23 NOTE — Progress Notes (Signed)
PT Cancellation Note  Patient Details Name: Diane Mejia MRN: [id number] DOB: 01-27-1923   Cancelled Treatment:    Reason Eval/Treat Not Completed: Patient not medically ready(on bedrest and vent)   Consuela Widener B Meriam Chojnowski 10/23/2018, 7:06 AM  Elwyn Reach, PT Acute Rehabilitation Services Pager: [phone] Office: [phone]

## 2018-10-23 NOTE — Progress Notes (Addendum)
STROKE TEAM PROGRESS NOTE   INTERVAL HISTORY Her RN is at the bedside.  Patient intubated, NAD, resting comfortably. Discussed with CCM the plan to extubate. RN to take patient to MRI and/or CT. Stopped propofol drip. Plan to wean neo for BP control.   Vitals:   10/23/18 0600 10/23/18 0700 10/23/18 0800 10/23/18 0806  BP: 103/88 (!) 83/58 (!) 89/62 109/66  Pulse: 84 (!) 106 (!) 103 92  Resp: 16 16 17    Temp:      TempSrc:      SpO2: 100% 100% 100%   Weight:      Height:        CBC:  Recent Labs  Lab 11/02/2018 1144 11/10/2018 1146 10/23/18 0550  WBC 8.9  --  14.6*  NEUTROABS 5.7  --   --   HGB 13.2 14.3 9.7*  HCT 44.4 42.0 30.8*  MCV 99.1  --  99.0  PLT 259  --  384    Basic Metabolic Panel:  Recent Labs  Lab 10/29/2018 1144 11/02/2018 1146 10/23/18 0550  NA 138 140 141  K 3.7 3.7 3.4*  CL 105 103 116*  CO2 22  --  17*  GLUCOSE 134* 132* 106*  BUN 19 22 18   CREATININE 1.82* 1.70* 1.50*  CALCIUM 8.6*  --  7.1*   Lipid Panel:     Component Value Date/Time   CHOL 127 10/23/2018 0550   TRIG 224 (H) 10/23/2018 0550   HDL 24 (L) 10/23/2018 0550   CHOLHDL 5.3 10/23/2018 0550   VLDL 45 (H) 10/23/2018 0550   LDLCALC 58 10/23/2018 0550   HgbA1c:  Lab Results  Component Value Date   HGBA1C 5.6 10/23/2018   Urine Drug Screen:     Component Value Date/Time   LABOPIA NONE DETECTED 12/23/2013 0032   COCAINSCRNUR NONE DETECTED 12/23/2013 0032   LABBENZ NONE DETECTED 12/23/2013 0032   AMPHETMU NONE DETECTED 12/23/2013 0032   THCU NONE DETECTED 12/23/2013 0032   LABBARB NONE DETECTED 12/23/2013 0032    Alcohol Level     Component Value Date/Time   ETH <11 12/22/2013 2315    IMAGING Ct Angio Head W Or Wo Contrast  Result Date:  CLINICAL DATA:  Acute presentation with left-sided weakness. Hyperdense right middle cerebral artery. EXAM: CT ANGIOGRAPHY HEAD AND NECK TECHNIQUE: Multidetector CT imaging of the head and neck was performed using the standard  protocol during bolus administration of intravenous contrast. Multiplanar CT image reconstructions and MIPs were obtained to evaluate the vascular anatomy. Carotid stenosis measurements (when applicable) are obtained utilizing NASCET criteria, using the distal internal carotid diameter as the denominator. CONTRAST:  41mL ISOVUE-370 IOPAMIDOL (ISOVUE-370) INJECTION 76% COMPARISON:  Head CT earlier same day FINDINGS: CTA NECK FINDINGS Aortic arch: Aortic atherosclerosis. Brachiocephalic vessel origins widely patent. Right carotid system: Common carotid artery tortuous but widely patent to the bifurcation. Soft and calcified plaque at the carotid bifurcation and ICA bulb. No ICA stenosis. Left carotid system: Common carotid artery widely patent to the bifurcation region. Minimal atherosclerotic plaque at the bifurcation. No ICA stenosis. Vertebral arteries: Both vertebral arteries are widely patent at their origins and through the cervical region to the foramen magnum. Skeleton: Advanced cervical spondylosis. Other neck: No mass or lymphadenopathy. Upper chest: Pulmonary fibrotic markings. Pericardial effusion. Question 1 cm mass in the right upper lobe. Review of the MIP images confirms the above findings CTA HEAD FINDINGS Anterior circulation: Both internal carotid arteries are patent through the skull base and siphon regions.  No stenosis. The left anterior and middle cerebral vessels are widely patent and normal. On the right, the anterior cerebral division is widely patent and normal. There is occlusion of the M1 segment due to an embolus immediately beyond its origin. Some distal vessel collateral opacification is noted. Posterior circulation: Both vertebral arteries are widely patent through the foramen magnum to the basilar. No basilar stenosis. Mild bulbous configuration of the distal basilar artery without a true berry aneurysm. Venous sinuses: Patent and normal. Anatomic variants: None significant. Delayed  phase: No abnormal enhancement. Review of the MIP images confirms the above findings IMPRESSION: Embolic occlusion of the right M1 segment. Minimal atherosclerotic change at the carotid bifurcations without stenosis. These results were communicated to Dr. Lorraine Lax at 12:17 pmon 01/14/2020by text page via the [facility] messaging system. Pulmonary fibrotic pattern.  Question 1 cm nodule right upper lobe. Electronically Signed   By: Nelson Chimes M.D.   On: 10/21/2018 12:23   Ct Angio Neck W Or Wo Contrast  Result Date: 10/29/2018 CLINICAL DATA:  Acute presentation with left-sided weakness. Hyperdense right middle cerebral artery. EXAM: CT ANGIOGRAPHY HEAD AND NECK TECHNIQUE: Multidetector CT imaging of the head and neck was performed using the standard protocol during bolus administration of intravenous contrast. Multiplanar CT image reconstructions and MIPs were obtained to evaluate the vascular anatomy. Carotid stenosis measurements (when applicable) are obtained utilizing NASCET criteria, using the distal internal carotid diameter as the denominator. CONTRAST:  52mL ISOVUE-370 IOPAMIDOL (ISOVUE-370) INJECTION 76% COMPARISON:  Head CT earlier same day FINDINGS: CTA NECK FINDINGS Aortic arch: Aortic atherosclerosis. Brachiocephalic vessel origins widely patent. Right carotid system: Common carotid artery tortuous but widely patent to the bifurcation. Soft and calcified plaque at the carotid bifurcation and ICA bulb. No ICA stenosis. Left carotid system: Common carotid artery widely patent to the bifurcation region. Minimal atherosclerotic plaque at the bifurcation. No ICA stenosis. Vertebral arteries: Both vertebral arteries are widely patent at their origins and through the cervical region to the foramen magnum. Skeleton: Advanced cervical spondylosis. Other neck: No mass or lymphadenopathy. Upper chest: Pulmonary fibrotic markings. Pericardial effusion. Question 1 cm mass in the right upper lobe. Review of the MIP images  confirms the above findings CTA HEAD FINDINGS Anterior circulation: Both internal carotid arteries are patent through the skull base and siphon regions. No stenosis. The left anterior and middle cerebral vessels are widely patent and normal. On the right, the anterior cerebral division is widely patent and normal. There is occlusion of the M1 segment due to an embolus immediately beyond its origin. Some distal vessel collateral opacification is noted. Posterior circulation: Both vertebral arteries are widely patent through the foramen magnum to the basilar. No basilar stenosis. Mild bulbous configuration of the distal basilar artery without a true berry aneurysm. Venous sinuses: Patent and normal. Anatomic variants: None significant. Delayed phase: No abnormal enhancement. Review of the MIP images confirms the above findings IMPRESSION: Embolic occlusion of the right M1 segment. Minimal atherosclerotic change at the carotid bifurcations without stenosis. These results were communicated to Dr. Lorraine Lax at 12:17 pmon 01/17/2020by text page via the [facility] messaging system. Pulmonary fibrotic pattern.  Question 1 cm nodule right upper lobe. Electronically Signed   By: Nelson Chimes M.D.   On: 10/22/2018 12:23   Dg Chest Portable 1 View  Result Date: 10/22/2018 CLINICAL DATA:  Post intubation EXAM: PORTABLE CHEST 1 VIEW COMPARISON:  Portable exam 1516 hours FINDINGS: Tip of endotracheal tube projects 2.3 cm above carina. Enlargement of cardiac  silhouette. Mediastinal contours and pulmonary vascularity normal. Large hiatal hernia seen on previous exam not demonstrated on current study. Atherosclerotic calcification aorta. Chronic interstitial disease changes again identified, progressive since prior study. Difficult to completely exclude superimposed acute infiltrates in the mid to lower lungs bilaterally though this could represent progression of chronic interstitial disease. No pleural effusion or pneumothorax. Bones  demineralized. IMPRESSION: Enlargement of cardiac silhouette. Diffuse interstitial infiltrates, potentially due to progression of chronic interstitial lung disease though can not exclude acute superimposed infiltrates at the lower lungs. Electronically Signed   By: Lavonia Dana M.D.   On: 10/27/2018 15:27   Ct Head Code Stroke Wo Contrast  Addendum Date: 10/19/2018   ADDENDUM REPORT: 11/12/2018 11:59 ADDENDUM: These results were called by telephone at the time of interpretation on 11/02/2018 at 11:55 am to Dr. Lorraine Lax, who verbally acknowledged these results. Electronically Signed   By: Franchot Gallo M.D.   On: 10/26/2018 11:59   Result Date: 10/20/2018 CLINICAL DATA:  Code stroke.  Left-sided weakness EXAM: CT HEAD WITHOUT CONTRAST TECHNIQUE: Contiguous axial images were obtained from the base of the skull through the vertex without intravenous contrast. COMPARISON:  CT head 10/31/2015 FINDINGS: Brain: Moderate to advanced atrophy. Chronic white matter changes stable from the prior study and moderate in degree. Negative for acute infarct, hemorrhage, mass.  No midline shift. Vascular: Hyperdense right MCA may respect reflect thrombosis. CTA recommended. Skull: Negative Sinuses/Orbits: Mild mucosal edema left sphenoid sinus. Otherwise sinuses are clear. Negative orbit Other: None ASPECTS (Gillespie Stroke Program Early CT Score) - Ganglionic level infarction (caudate, lentiform nuclei, internal capsule, insula, M1-M3 cortex): 7 - Supraganglionic infarction (M4-M6 cortex): 3 Total score (0-10 with 10 being normal): 10 IMPRESSION: 1. Hyperdense right MCA suggesting thrombosis. CTA recommended for further evaluation 2. No evidence of acute infarct or hemorrhage 3. ASPECTS is 10 Electronically Signed: By: Franchot Gallo M.D. On: 11/05/2018 11:53    PHYSICAL EXAM : elderly Caucasian lady who is intubated and sedated. Not in distress. . Afebrile. Head is nontraumatic. Neck is supple without bruit.    Cardiac exam no  murmur or gallop. Lungs are clear to auscultation. Distal pulses are well felt. Neurological Exam :  Divided sedated. Eyes are closed. Pupils irregular 3 mm sluggishly reactive. Doll's eye movements are present. Fundi could not be visualized. No spontaneous extremity movement but does withdraw all 4 extremities to painful stimuli and right more than left. The tendon reflexes are present. Plantars are downgoing. ASSESSMENT/PLAN Diane Mejia is a 85 y.o. female with history of dementia, a. Fib ( plavix only, no OAC), HTN, CHF ( lasix and BB), depression ( prozac) presenting with  Fall.   "I fell"  (Code Stroke per EMS) HPI: Diane Mejia is an 85 y.o. female with some dementia at baseline, but still lives at home and is able to do all her ADLs, care for husband, walks and prepares food (mRS 1, but some debility for cognitive issues prevents her from doing bills, driving etc). PMH Afib, only on Plavix, no OAC, HTN, arthritis, CHF on lasix and BB. Depression on Prozac. This morning she woke up and made breakfast and was ambulating/speaking normally per family. She was last seen normal at 9am when she went back to bed. About an hour later she fell when she got up to stand out of bed. Apparently she hit the bedside table with her right side abd and head. Upon arrival in the EMS bay, her main complaint was that  she fell and her head hurt. Unable to score h/a on scale 1-10. She is very HOH. She was unable to lift left arm/leg, primary gaze to right, but eventually able to cross midline. Attends poorly on left and has speech deficits. She was emergently sent to CT. CTH normal, no bleed, ASPECTS 10. CTA showed R M1 occlusion. No contraindication to IV Alteplase. Family consented to IV Alteplase and IA thrombectomy. Pt continued to c/o right lower abd and h/a during IV tPA infusion, but without change in quality of initial h/a and no neuro changes.    Stroke:  right MCA infarct embolic secondary to a fib. not on  anticoagulation.due to right M1 occlusion status post mechanical thrombectomy with successful recanalization  Code Stroke CT head: 1. Hyperdense right MCA suggesting thrombosis. CTA recommended for further evaluation. No evidence of acute infarct or hemorrhage 3. ASPECTS is 10   CTA head & neck :Embolic occlusion of the right M1 segment. Minimal atherosclerotic change at the carotid bifurcations without stenosis.  MRI small multifocal areas of acute nonhemorrhagic infarcts in the right hemisphere. No hemorrhagic transformation, cytotoxic edema or midline shift.  2D Echo   Left ventricle: Diffuse hypokinesis worse in the inferior wall   and septum. The cavity size was normal. Wall thickness was   increased in a pattern of moderate LVH. Systolic function was   mildly reduced. The estimated ejection fraction was in the range   of 45% to 50%. Left ventricular diastolic function parameters   were normal. - Aortic valve: Sclerosis without stenosis. - Mitral valve: Calcified annulus. Moderately thickened leaflets .   There was mild regurgitation. - Left atrium: The atrium was mildly dilated. - Pulmonary arteries: PA peak pressure: 37 mm Hg (S).  LDL 45  HgbA1c 5.8  SCD's for VTE prophylaxis Diet Order            Diet NPO time specified  Diet effective now               clopidogrel 75 mg daily prior to admission, now on nothing d/t receiving IV TPA. Plan to start [facility].  Therapy recommendations:  Pending   Disposition:  Pending   Hypertension  Unstable; on Neo . Permissive hypertension (OK if < 220/120) but gradually normalize in 5-7 days . Long-term BP goal normotensive  Hyperlipidemia  Home meds:  none,  Not resumed in hospital  LDL 45, goal < 70  Diabetes type II  HgbA1c 5.8, goal < 7.0  Controlled  Other Stroke Risk Factors  Advanced age  Obesity, Body mass index is 30.98 kg/m., recommend weight loss, diet and exercise as appropriate   Congestive heart  failure  Other Active Problems  Dementia  Depression    Laurey Morale, MSN, NP-C [facility] day # 1 I have personally obtained history,examined this patient, reviewed notes, independently viewed imaging studies, participated in medical decision making and plan of care.ROS completed by me personally and pertinent positives fully documented  I have made any additions or clarifications directly to the above note. Agree with note above.  She presented with right M1 occlusion and underwent thrombectomywith successful recanalization. Continue strict blood pressure control as per post intervention protocol. Wean off ventilatory support as tolerated. No family available at the bedside for discussion. Discussed with Dr. Lake Bells This patient is critically ill and at significant risk of neurological worsening, death and care requires constant monitoring of vital signs, hemodynamics,respiratory and cardiac monitoring, extensive review of multiple databases,  frequent neurological assessment, discussion with family, other specialists and medical decision making of high complexity.I have made any additions or clarifications directly to the above note.This critical care time does not reflect procedure time, or teaching time or supervisory time of PA/NP/Med Resident etc but could involve care discussion time.  I spent 35 minutes of neurocritical care time  in the care of  this patient.      Antony Contras, MD Medical Director [facility] Pager: [phone] 10/23/2018 4:01 PM    To contact Stroke Continuity provider, please refer to [email or web address] After hours, contact General Neurology

## 2018-10-23 NOTE — Progress Notes (Signed)
Pt transported to MRI on full vent support without complication.  Pt placed back on CPAP/PS at this time tolerating well.

## 2018-10-23 NOTE — Progress Notes (Addendum)
eLink Physician-Brief Progress Note Patient Name: Diane Mejia DOB: November 17, 1922 MRN: [id number]   Date of Service  10/23/2018  HPI/Events of Note  Pt went into rapid atrial fibrillation into the 150s-160s per RN but was agitated at that point.  Fentanyl and Versed given.  eICU Interventions  HR improved now into the 80s.  She remained hemodynamically stable.  Can give Lopressor IV prn.     Intervention Category Major Interventions: Arrhythmia - evaluation and management  Elsie Lincoln 10/23/2018, 8:20 PM   2:16 AM Informed by bedside ICU RN that pt has not had any urine output.  Pt had straight cath atleast 2x now.  Pt has NS@50ml /hr.  Insert foley cath for I&O monitoring.

## 2018-10-23 NOTE — Progress Notes (Signed)
SLP Cancellation Note  Patient Details Name: ISABELLAROSE KOPE MRN: [id number] DOB: 11-14-1922   Cancelled treatment:        Orders received for speech-language-cognitive assessment. Cancel, presently on vent. Will follow for extubation.   Houston Siren 10/23/2018, 8:37 AM   Orbie Pyo Colvin Caroli.Ed Risk analyst [phone] Office [phone]

## 2018-10-23 NOTE — Progress Notes (Addendum)
NAME:  Diane Mejia, MRN:  [id number], DOB:  May 18, 1923, LOS: 1 ADMISSION DATE:  10/20/2018, CONSULTATION DATE:  10/14/2018 REFERRING MD:  Dr. Lita Mains, CHIEF COMPLAINT:  CVA   Brief History   This is a 85 year old female with a history of atrial fibrillation only on Plavix, HTN, HFrHF, depression, and dementia who came in after a fall at home, LKN was 9AM. Found to have left upper and lower extremity weakness, decreased sensation, and right sided gaze preference. Found to have an M1 occlusion on the CTA. She had tPA at 1204 with little improvement in symptoms. Patient was intubated and taken for an thrombectomy s/p complete revascularization.   Past Medical History   Dementia, hypertension, arthritis, CHF, depression  Significant Hospital Events   10/31/2018 > TPA/ Thrombectomy , intubated  Consults:  PCCM  Procedures:  10/14/2018 Thrombectomy w/ complete revascularization Right M1 1/9 ETT 1/9 Left radial aline  Significant Diagnostic Tests:  10/23/2018: CT head IMPRESSION: 1. Hyperdense right MCA suggesting thrombosis. CTA recommended for further evaluation 2. No evidence of acute infarct or hemorrhage 3. ASPECTS is 10  10/14/2018 CTA Head IMPRESSION: Embolic occlusion of the right M1 segment. Minimal atherosclerotic change at the carotid bifurcations without stenosis. Pulmonary fibrotic pattern.  Question 1 cm nodule right upper lobe.  1/9 TTE >> LVEF 45-50%  - Left ventricle: Diffuse hypokinesis worse in the inferior wall and septum. The cavity size was normal. Wall thickness was increased in a pattern of moderate LVH. Systolic function was mildly reduced. The estimated ejection fraction was in the range of 45% to 50%. Left ventricular diastolic function parameters were normal. - Aortic valve: Sclerosis without stenosis. - Mitral valve: Calcified annulus. Moderately thickened leaflets.  There was mild regurgitation. - Left atrium: The atrium was mildly dilated. - Pulmonary  arteries: PA peak pressure: 37 mm Hg (S).  Micro Data:  1/9 MRSA PCR >> neg  Antimicrobials:   1/9 Cefazolin preop  Interim history/subjective:  Overnight- > R FA infliration of neo s/p phentalomine; in/out cath for urinary retention  This am per RN- off sedation, moving all extremities, slightly weaker on left but not following specific commands, weaning well on SBT 5/5 and still requiring Neo PIV at 230 mcg/min.  No UOP since last in/out cath.  Scheduled for MRI, therefore, re-sedated on propofol and going to MRI now.   Afebrile, Hgb trend 13.2 - 9.7  Objective   Blood pressure 109/66, pulse 92, temperature 97.7 F (36.5 C), temperature source Axillary, resp. rate 17, height 5' 3.5" (1.613 m), weight 80.6 kg, SpO2 100 %.    Vent Mode: CPAP;PSV FiO2 (%):  [40 %-100 %] 40 % Set Rate:  [12 bmp-16 bmp] 16 bmp Vt Set:  [430 mL-500 mL] 430 mL PEEP:  [5 cmH20] 5 cmH20 Pressure Support:  [5 cmH20] 5 cmH20 Plateau Pressure:  [18 cmH20-21 cmH20] 18 cmH20   Intake/Output Summary (Last 24 hours) at 10/23/2018 0929 Last data filed at 10/23/2018 0800 Gross per 24 hour  Intake 6866.06 ml  Output 555 ml  Net 6311.06 ml   Filed Weights   11/02/2018 1100 10/26/2018 1221  Weight: 80.6 kg 80.6 kg   Examination: General:  Elderly female sedated on MV in NAD HEENT: MM pink/moist, pupils sluggish 4, anicteric, 7.5 ETT at 23, no OGT, no JVD Neuro: Sedated on propofol 20 mcg/kg/min CV: IRIR, rate controlled, right groin site soft/ dressing dry/clean, +distal palpable pulses PULM: even/non-labored on full MV, lungs bilaterally clear GI: obese, soft, +BS, purwick  catheter Extremities: warm/dry, no LE edema, bruising to right hand, bruising to RF with borders marked  Skin: no rashes  Resolved Hospital Problem list    Assessment & Plan:  This is a 85 year old female with a history of atrial fibrillation only on Plavix, HTN, HFrHF, depression, and dementia who came in after a fall at home, LKN was  9AM. Found to have left upper and lower extremity weakness, decreased sensation, and right sided gaze preference. Found to have an M1 occlusion on the CTA. She had tPA at 1204 with little improvement in symptoms. Patient was intubated and taken for an thrombectomy s/p complete revascularization of right M1.  R MCA stroke s/p TPA (1/9 at 1204) and thrombectomy w/complete revascularization of R M1  P:  Neurology primary To MRI now Ongoing SBP goal 120-140 for 24 hrs post TPA/ thrombectomy- still requiring Neo Continue aline Echocardiogram as above  A1c 5.6, trending glucose  lipid panel noted, triglycerides 224- monitor while on propofol, LDL 45 PT and OT SLP after extubation  ongoing frequent neuro checks  Respiratory insufficiency in the post-operative setting. - no CXR this am; evidence of chronic interstitial disease on CXR with stable ETT, however showed new R> L interstitial disease compared to prior flim P:  Continue full MV support 8 cc/kg/ PRVC, rate 16 for MRI Peep 5/ 40% Post MRI trip- wean sedation off/ SBT, hopeful to extubate Will need SLP post extubation  Need to consider aspiration given leukocytosis, however no significant secretions and weaned well this am.   Hypotension Leukocytosis- afebrile  - thought related to sedation, however off sedation this am, still requiring neo at 200 mcg/min P:  Goal SBP remains 120-140 - Plans to wean and extubate post MRI, if still requiring pressor support above 200 mcg/min peripherally, will need central access to prevent further extravagation  Other ddx: - check ABG given new hyperchloremic acidosis -  Low UOP, sCr improved from yesterday. Send UA.  -  Additionally, Hgb drop from 13.2 -> 14.3 -> 9.7, (+6.3L fluids positive).  Will recheck H/H again at 1400.  If continues to decline, need to consider CT to rule out retroperitoneal bleed.   -  Also appears to have new R>L interstitial process compared to prior CXR; r/o aspiration  pneumonia.  Will check PCT and send sputum cx.   Hypertension- currently hypotensive  History of HFrEF:  - TTE 1/9 with improved LVEF from 35-40% in 2016 to now 45-50%, improved PH, PAP 37 mmHg, diffuse hypokinesis in inferior and septum  P:  Holding home lasix and toprol  Depression Dementia: -Baseline dementia, she lives at home and is able to perform all ADLs, she cares for husband and prepares food. She does have some cognitive issues that limits her ability to do her bills and drive.  P:  Holding Zoloft for now  AKI Urinary retention/ possible oliguria  Hyperchloremic acidosis s/p 6L IVF  Hypokalemia- 3.4 On admission Cr was 1.82.  sCr improving, 1.50 P:  No UOP since last in/out cath last night at 2200.  Bladder scan on return to ICU.  May need foley Send for UA Check mag Trend renal panel/ UOP  Atrial fibrillation:  On Plavix at home P:  Tele monitoring, currently rate controlled  Holding plavix   Best practice:  Diet: NPO Pain/Anxiety/Delirium protocol (if indicated): Fentanyl and propofol PRN VAP protocol (if indicated): Yes DVT prophylaxis: SCDs, s/p alteplase GI prophylaxis: Protonix Glucose control: Monitor CBCs Mobility: Bedbound Code Status: full Family Communication:  No family currently at bedside on am rounds.  Disposition: ICU  Labs   CBC: Recent Labs  Lab 11/05/2018 1144 10/26/2018 1146 10/23/18 0550  WBC 8.9  --  14.6*  NEUTROABS 5.7  --   --   HGB 13.2 14.3 9.7*  HCT 44.4 42.0 30.8*  MCV 99.1  --  99.0  PLT 259  --  790    Basic Metabolic Panel: Recent Labs  Lab 10/15/2018 1144  1146 10/23/18 0550  NA 138 140 141  K 3.7 3.7 3.4*  CL 105 103 116*  CO2 22  --  17*  GLUCOSE 134* 132* 106*  BUN 19 22 18   CREATININE 1.82* 1.70* 1.50*  CALCIUM 8.6*  --  7.1*   GFR: Estimated Creatinine Clearance: 23.8 mL/min (A) (by C-G formula based on SCr of 1.5 mg/dL (H)). Recent Labs  Lab 11/06/2018 1144 10/23/18 0550  WBC 8.9 14.6*     Liver Function Tests: Recent Labs  Lab 11/06/2018 1144  AST 21  ALT 9  ALKPHOS 59  BILITOT 0.8  PROT 6.8  ALBUMIN 3.0*   No results for input(s): LIPASE, AMYLASE in the last 168 hours. No results for input(s): AMMONIA in the last 168 hours.  ABG    Component Value Date/Time   PHART 7.440 10/21/2018 1600   PCO2ART 34.1 11/09/2018 1600   PO2ART 498.0 (H) 10/15/2018 1600   HCO3 23.2 11/09/2018 1600   TCO2 24 10/16/2018 1600   ACIDBASEDEF 1.0 10/15/2018 1600   O2SAT 100.0 11/01/2018 1600     Coagulation Profile: Recent Labs  Lab 10/14/2018 1144  INR 1.05    Cardiac Enzymes: No results for input(s): CKTOTAL, CKMB, CKMBINDEX, TROPONINI in the last 168 hours.  HbA1C: Hgb A1c MFr Bld  Date/Time Value Ref Range Status  10/23/2018 05:50 AM 5.6 4.8 - 5.6 % Final    Comment:    (NOTE) Pre diabetes:          5.7%-6.4% Diabetes:              >6.4% Glycemic control for   <7.0% adults with diabetes   08/05/2013 05:00 AM 5.8 (H) <5.7 % Final    Comment:    (NOTE)                                                                       According to the ADA Clinical Practice Recommendations for 2011, when HbA1c is used as a screening test:  >=6.5%   Diagnostic of Diabetes Mellitus           (if abnormal result is confirmed) 5.7-6.4%   Increased risk of developing Diabetes Mellitus References:Diagnosis and Classification of Diabetes Mellitus,Diabetes WIOX,7353,29(JMEQA 1):S62-S69 and Standards of Medical Care in         Diabetes - 2011,Diabetes STMH,9622,29 (Suppl 1):S11-S61.    CBG: Recent Labs  Lab 11/07/2018 1141 11/08/2018 2017 10/21/2018 2335 10/23/18 0344 10/23/18 0840  GLUCAP 120* 120* 96 97 92   CCT 45 mins  Kennieth Rad, MSN, AGACNP-BC [facility] Pgr: [phone] or if no answer [phone] 10/23/2018, 9:29 AM

## 2018-10-23 NOTE — Progress Notes (Signed)
OT Cancellation Note  Patient Details Name: Diane Mejia MRN: [id number] DOB: 06-May-1923   Cancelled Treatment:    Reason Eval/Treat Not Completed: Patient not medically ready (on bedrest and vent)  [place], OTR/L Acute Rehab Services Pager [phone] Office [phone]     Almon Register 10/23/2018, 7:50 AM

## 2018-10-24 DIAGNOSIS — D72829 Elevated white blood cell count, unspecified: Secondary | ICD-10-CM

## 2018-10-24 DIAGNOSIS — N179 Acute kidney failure, unspecified: Secondary | ICD-10-CM

## 2018-10-24 DIAGNOSIS — I639 Cerebral infarction, unspecified: Secondary | ICD-10-CM

## 2018-10-24 DIAGNOSIS — I4891 Unspecified atrial fibrillation: Secondary | ICD-10-CM

## 2018-10-24 DIAGNOSIS — I63411 Cerebral infarction due to embolism of right middle cerebral artery: Principal | ICD-10-CM

## 2018-10-24 DIAGNOSIS — I9589 Other hypotension: Secondary | ICD-10-CM

## 2018-10-24 LAB — GLUCOSE, CAPILLARY
GLUCOSE-CAPILLARY: 99 mg/dL (ref 70–99)
Glucose-Capillary: 106 mg/dL — ABNORMAL HIGH (ref 70–99)
Glucose-Capillary: 93 mg/dL (ref 70–99)
Glucose-Capillary: 94 mg/dL (ref 70–99)
Glucose-Capillary: 98 mg/dL (ref 70–99)
Glucose-Capillary: 99 mg/dL (ref 70–99)

## 2018-10-24 LAB — TYPE AND SCREEN
ABO/RH(D): O POS
Antibody Screen: NEGATIVE
Unit division: 0

## 2018-10-24 LAB — BPAM RBC
Blood Product Expiration Date: 202002092359
ISSUE DATE / TIME: 202001101523
Unit Type and Rh: 5100

## 2018-10-24 LAB — MAGNESIUM: Magnesium: 2 mg/dL (ref 1.7–2.4)

## 2018-10-24 LAB — CBC
HCT: 33.4 % — ABNORMAL LOW (ref 36.0–46.0)
Hemoglobin: 10.3 g/dL — ABNORMAL LOW (ref 12.0–15.0)
MCH: 31.2 pg (ref 26.0–34.0)
MCHC: 30.8 g/dL (ref 30.0–36.0)
MCV: 101.2 fL — ABNORMAL HIGH (ref 80.0–100.0)
Platelets: 226 10*3/uL (ref 150–400)
RBC: 3.3 MIL/uL — AB (ref 3.87–5.11)
RDW: 14.9 % (ref 11.5–15.5)
WBC: 13.3 10*3/uL — ABNORMAL HIGH (ref 4.0–10.5)
nRBC: 0.3 % — ABNORMAL HIGH (ref 0.0–0.2)

## 2018-10-24 LAB — RENAL FUNCTION PANEL
Albumin: 2.2 g/dL — ABNORMAL LOW (ref 3.5–5.0)
Anion gap: 9 (ref 5–15)
BUN: 19 mg/dL (ref 8–23)
CHLORIDE: 120 mmol/L — AB (ref 98–111)
CO2: 15 mmol/L — AB (ref 22–32)
Calcium: 7.3 mg/dL — ABNORMAL LOW (ref 8.9–10.3)
Creatinine, Ser: 1.9 mg/dL — ABNORMAL HIGH (ref 0.44–1.00)
GFR calc Af Amer: 26 mL/min — ABNORMAL LOW (ref >60)
GFR calc non Af Amer: 22 mL/min — ABNORMAL LOW (ref >60)
Glucose, Bld: 97 mg/dL (ref 70–99)
Phosphorus: 4.3 mg/dL (ref 2.5–4.6)
Potassium: 3.7 mmol/L (ref 3.5–5.1)
Sodium: 144 mmol/L (ref 135–145)

## 2018-10-24 LAB — PROCALCITONIN: Procalcitonin: 0.57 ng/mL

## 2018-10-24 MED ORDER — ENOXAPARIN SODIUM 40 MG/0.4ML ~~LOC~~ SOLN: 40.0000 mg | SUBCUTANEOUS | Status: DC

## 2018-10-24 MED ORDER — ENOXAPARIN SODIUM 30 MG/0.3ML ~~LOC~~ SOLN
30.0000 mg | SUBCUTANEOUS | Status: DC
  Administered 2018-10-24 – 2018-10-25 (×2): 30 mg via SUBCUTANEOUS
  Filled 2018-10-24 (×2): qty 0.3

## 2018-10-24 NOTE — Progress Notes (Signed)
STROKE TEAM PROGRESS NOTE   INTERVAL HISTORY Her RN is at the bedside.  Patient still intubated, off sedation, agitated and trying to sit up. On Neo but BP 90s, with agitation afib RVR with HR goes up to 160s. Not following commands.   Vitals:   10/24/18 0200 10/24/18 0215 10/24/18 0230 10/24/18 0400  BP: (!) 73/49 (!) 88/55 104/82   Pulse: (!) 118 99 92   Resp: (!) 22 16 16    Temp:    98 F (36.7 C)  TempSrc:    Axillary  SpO2: 100% 100% 100%   Weight:      Height:        CBC:  Recent Labs  Lab 11/12/2018 1144  10/23/18 0550  10/23/18 2115 10/24/18 0505  WBC 8.9  --  14.6*  --   --  13.3*  NEUTROABS 5.7  --   --   --   --   --   HGB 13.2   < > 9.7*   < > 10.9* 10.3*  HCT 44.4   < > 30.8*   < > 36.0 33.4*  MCV 99.1  --  99.0  --   --  101.2*  PLT 259  --  282  --   --  226   < > = values in this interval not displayed.    Basic Metabolic Panel:  Recent Labs  Lab 10/23/18 0550 10/23/18 1101 10/24/18 0505  NA 141  --  144  K 3.4*  --  3.7  CL 116*  --  120*  CO2 17*  --  15*  GLUCOSE 106*  --  97  BUN 18  --  19  CREATININE 1.50*  --  1.90*  CALCIUM 7.1*  --  7.3*  MG  --  1.3* 2.0  PHOS  --   --  4.3   Lipid Panel:     Component Value Date/Time   CHOL 127 10/23/2018 0550   TRIG 224 (H) 10/23/2018 0550   HDL 24 (L) 10/23/2018 0550   CHOLHDL 5.3 10/23/2018 0550   VLDL 45 (H) 10/23/2018 0550   LDLCALC 58 10/23/2018 0550   HgbA1c:  Lab Results  Component Value Date   HGBA1C 5.6 10/23/2018   Urine Drug Screen:     Component Value Date/Time   LABOPIA NONE DETECTED 12/23/2013 0032   COCAINSCRNUR NONE DETECTED 12/23/2013 0032   LABBENZ NONE DETECTED 12/23/2013 0032   AMPHETMU NONE DETECTED 12/23/2013 0032   THCU NONE DETECTED 12/23/2013 0032   LABBARB NONE DETECTED 12/23/2013 0032    Alcohol Level     Component Value Date/Time   ETH <11 12/22/2013 2315    IMAGING  Ct Angio Head W Or Wo Contrast Ct Angio Neck W Or Wo  Contrast 10/14/2018 IMPRESSION:  Embolic occlusion of the right M1 segment. Minimal atherosclerotic change at the carotid bifurcations without stenosis.  Pulmonary fibrotic pattern. Question 1 cm nodule right upper lobe.    Mr Brain Wo Contrast 10/23/2018 IMPRESSION:  Multifocal areas of acute nonhemorrhagic infarction involving the RIGHT hemisphere, much less than would be expected given the initial RIGHT M1 occlusion. No postprocedural/post treatment complications. Advanced atrophy with extensive small vessel disease.    Dg Chest Port 1 View 10/23/2018 IMPRESSION:  1. Central line with tip at the cavoatrial junction. No pneumothorax.  2. Interstitial edema bibasilar atelectasis.  3. Endotracheal tube unchanged.    Dg Chest Portable 1 View 10/20/2018 IMPRESSION:  Enlargement of cardiac silhouette. Diffuse interstitial infiltrates,  potentially due to progression of chronic interstitial lung disease though can not exclude acute superimposed infiltrates at the lower lungs.    Ct Head Code Stroke Wo Contrast 10/29/2018   IMPRESSION:  1. Hyperdense right MCA suggesting thrombosis. CTA recommended for further evaluation  2. No evidence of acute infarct or hemorrhage  3. ASPECTS is 10   DSA 10/27/2018 Right MCA M1 segment occlusion s/p emergent mechanical thrombectomy achieving a TICI 3 revascularization 11/13/2018 by Dr. Estanislado Pandy.   Transthoracic Echocardiogram  Left ventricle: Diffuse hypokinesis worse in the inferior wall   and septum. The cavity size was normal. Wall thickness was   increased in a pattern of moderate LVH. Systolic function was   mildly reduced. The estimated ejection fraction was in the range   of 45% to 50%. Left ventricular diastolic function parameters   were normal. - Aortic valve: Sclerosis without stenosis. - Mitral valve: Calcified annulus. Moderately thickened leaflets .   There was mild regurgitation. - Left atrium: The atrium was mildly dilated. -  Pulmonary arteries: PA peak pressure: 37 mm Hg (S).     PHYSICAL EXAM  Temp:  [97 F (36.1 C)-98 F (36.7 C)] 97.2 F (36.2 C) (01/11 0800) Pulse Rate:  [82-144] 88 (01/11 0800) Resp:  [0-24] 16 (01/11 0800) BP: (73-153)/(39-94) 100/71 (01/11 0800) SpO2:  [95 %-100 %] 100 % (01/11 0909) Arterial Line BP: (61-140)/(57-76) 95/63 (01/10 1300) FiO2 (%):  [40 %] 40 % (01/11 0909)  General - Well nourished, well developed, intubated and agitated.  Ophthalmologic - fundi not visualized due to noncooperation.  Cardiovascular - irregularly irregular heart rate and rhythm with RVR.  Neuro - intubated, not on sedation, agitated and trying to sit up in bed. Not following commands, eyes open, blinking spontaneously, but inconsistently blinking to visual threat, able to attend both side, incomplete on bilateral gaze. PERRL. Facial symmetry not able to test due to ET. Tongue midline in mouth. BUE symmetrical in strength, able to hold body sit up half way in bed. BLE 2+/5 with agitation, but seems LLE weaker than right. DTR 1+ and no babinski. Sensation, coordination and gait not tested.    ASSESSMENT/PLAN [name] is a 85 y.o. female with history of dementia, a. Fib ( plavix only, no OAC), HTN, CHF ( lasix and BB), depression ( prozac) presenting with a fall, left side weakness, right gaze and left neglect.  IV TPA 11/05/2018 Dr Aroor.  Stroke:  right MCA infarct due to right M1 occlusion s/p tPA and IR with TICI3 reperfusion, embolic secondary to a fib not on AC.   Code Stroke CT head: Hyperdense right MCA suggesting thrombosis.  CTA head & neck Embolic occlusion of the right M1 segment. Minimal atherosclerotic change at the carotid bifurcations without stenosis.  MRI small multifocal areas of acute nonhemorrhagic infarcts in the right hemisphere. No hemorrhagic transformation, cytotoxic edema or midline shift.  2D Echo  - EF 45 - 50 %. No cardiac source of emboli identified.   LDL  45  HgbA1c 5.8  lovenox for VTE prophylaxis  clopidogrel 75 mg daily prior to admission, now on ASA 325  Therapy recommendations:  Pending   Disposition:  Pending   Afib with RVR  Heart rate goes up to 160s during agitation  On sedation for agitation  Not on beta-blocker due to hypotension  CCM on board  We will consider DOAC once patient more stabilized  Hypotension . BP low at 90s . On Neo-Synephrine . Close monitoring .  Avoid fluid overload . Long-term BP goal normotensive  AKI on CKD  Creatinine 1.82-1.5-1.9  On IV fluid  BMP monitoring  CCM on board  Other Stroke Risk Factors  Advanced age  Obesity, Body mass index is 30.98 kg/m., recommend weight loss, diet and exercise as appropriate   Congestive heart failure  Other Active Problems  Dementia  Depression   Anemia 13.2-9.7-8.8-10.3  Leukocytosis -8.9-14.6-13.3  Question 1 cm nodule right upper lobe.     Hospital day # 2  This patient is critically ill and at significant risk of neurological worsening, death and care requires constant monitoring of vital signs, hemodynamics,respiratory and cardiac monitoring, extensive review of multiple databases, frequent neurological assessment, discussion with family, other specialists and medical decision making of high complexity.I have made any additions or clarifications directly to the above note.This critical care time does not reflect procedure time, or teaching time or supervisory time of PA/NP/Med Resident etc but could involve care discussion time.  I spent 40 minutes of neurocritical care time  in the care of  this patient.  I discussed with CCM PA.  Rosalin Hawking, MD PhD Stroke Neurology 10/24/2018 2:27 PM   To contact Stroke Continuity provider, please refer to [email or web address] After hours, contact General Neurology

## 2018-10-24 NOTE — Progress Notes (Signed)
PT Cancellation Note  Patient Details Name: RIATA IKEDA MRN: [id number] DOB: 09/02/1923   Cancelled Treatment:    Reason Eval/Treat Not Completed: Patient not medically ready(on bedrest and vent)   Duncan Dull 10/24/2018, 6:53 AM

## 2018-10-24 NOTE — Progress Notes (Signed)
IV fluids stopped today, patient remains NPO. Urine output is 40cc for the last 8 hours. CCM paged. Awaiting response.

## 2018-10-24 NOTE — Plan of Care (Signed)
  Problem: Health Behavior/Discharge Planning: Goal: Ability to manage health-related needs will improve Outcome: Not Progressing   Problem: Clinical Measurements: Goal: Will remain free from infection Outcome: Progressing   Problem: Coping: Goal: Level of anxiety will decrease Outcome: Not Progressing Note:  Patient has been NPO since admission.   Problem: Elimination: Goal: Will not experience complications related to urinary retention Outcome: Progressing Note:  Foley placed for retention on 1/11. Patient has low urine output during night shift.

## 2018-10-24 NOTE — Progress Notes (Signed)
Patient extubated at bedside with no issues. Current oxygen sats are 100% on 4L nasal cannula. Patient is still confused and not following commands. Daughter and Son called with update.

## 2018-10-24 NOTE — Progress Notes (Signed)
Patient has not had any urine output since 1400 I&O. Bladder scanned patient twice with 34ml being the most volume in the bladder. Dr James Ivanoff notified and orders given to insert Foley for retention. RN to continue to monitor.

## 2018-10-24 NOTE — Progress Notes (Signed)
eLink Physician-Brief Progress Note Patient Name: Diane Mejia DOB: 07/05/1923 MRN: [id number]   Date of Service  10/24/2018  HPI/Events of Note  Patient extubated today - Bedside nurse feels that the patient has a poor cough and is retaining respiratory secretions. Sat = 99% on 2 L/min [place] O2.   eICU Interventions  Will order: 1. NT suction PRN.      Intervention Category Major Interventions: Other:  Lysle Dingwall 10/24/2018, 9:16 PM

## 2018-10-24 NOTE — Progress Notes (Signed)
NAME:  Diane Mejia, MRN:  [id number], DOB:  1923/09/01, LOS: 2 ADMISSION DATE:  11/12/2018, CONSULTATION DATE:  10/21/2018 REFERRING MD:  Dr. Lita Mains, CHIEF COMPLAINT:  CVA   Brief History   This is a 85 year old female with a history of atrial fibrillation only on Plavix, HTN, HFrHF, depression, and dementia who came in after a fall at home, LKN was 9AM. Found to have left upper and lower extremity weakness, decreased sensation, and right sided gaze preference. Found to have an M1 occlusion on the CTA. She had tPA at 1204 with little improvement in symptoms. Patient was intubated and taken for an thrombectomy s/p complete revascularization.   Past Medical History   Dementia, hypertension, arthritis, CHF, depression  Significant Hospital Events   11/12/2018 > TPA/ Thrombectomy , intubated 1/10: Hemorrhagic shock presumed transient hemorrhage from cath site.  Hemoglobin stabilized weaning pressors 1/11 passing spontaneous breathing trial Consults:  PCCM  Procedures:  11/11/2018 Thrombectomy w/ complete revascularization Right M1 1/9 ETT 1/9 Left radial aline 1/10: Right IJ triple-lumen catheter placed  Significant Diagnostic Tests:  10/21/2018: CT head IMPRESSION: 1. Hyperdense right MCA suggesting thrombosis. CTA recommended for further evaluation 2. No evidence of acute infarct or hemorrhage 3. ASPECTS is 10  11/01/2018 CTA Head IMPRESSION: Embolic occlusion of the right M1 segment. Minimal atherosclerotic change at the carotid bifurcations without stenosis. Pulmonary fibrotic pattern.  Question 1 cm nodule right upper lobe.  1/9 TTE >> LVEF 45-50%  - Left ventricle: Diffuse hypokinesis worse in the inferior wall and septum. The cavity size was normal. Wall thickness was increased in a pattern of moderate LVH. Systolic function was mildly reduced. The estimated ejection fraction was in the range of 45% to 50%. Left ventricular diastolic function parameters were normal. - Aortic  valve: Sclerosis without stenosis. - Mitral valve: Calcified annulus. Moderately thickened leaflets.  There was mild regurgitation. - Left atrium: The atrium was mildly dilated. - Pulmonary arteries: PA peak pressure: 37 mm Hg (S).  Micro Data:  1/9 MRSA PCR >> neg  Antimicrobials:   1/9 Cefazolin preop  Interim history/subjective:  Passing spontaneous breathing trial  Objective   Blood pressure 100/71, pulse 88, temperature (Abnormal) 97.2 F (36.2 C), temperature source Axillary, resp. rate 16, height 5' 3.5" (1.613 m), weight 80.6 kg, SpO2 100 %. CVP:  [21 mmHg-22 mmHg] 21 mmHg  Vent Mode: PRVC FiO2 (%):  [40 %] 40 % Set Rate:  [16 bmp] 16 bmp Vt Set:  [430 mL] 430 mL PEEP:  [5 cmH20] 5 cmH20 Pressure Support:  [5 cmH20] 5 cmH20 Plateau Pressure:  [18 cmH20-21 cmH20] 20 cmH20   Intake/Output Summary (Last 24 hours) at 10/24/2018 1031 Last data filed at 10/24/2018 0800 Gross per 24 hour  Intake 4663.84 ml  Output 525 ml  Net 4138.84 ml   Filed Weights   11/12/2018 1100 10/17/2018 1221  Weight: 80.6 kg 80.6 kg   Examination: General: This is a elderly light white female she is apparently very hard of hearing at baseline she is agitated and restless HEENT normocephalic atraumatic orally intubated Pulmonary: Scattered rhonchi no accessory use on spontaneous breathing trial Cardiac: Rapid rate when agitated, atrial fibrillation on telemetry Abdomen: Soft nontender no organomegaly Extremities: Warm and dry strong pulses Neuro: Awake, very hard of hearing, moves all extremities without focal deficits GU: Limited urine output.  Resolved Hospital Problem list    Assessment & Plan:  This is a 85 year old female with a history of atrial fibrillation only on Plavix,  HTN, HFrHF, depression, and dementia who came in after a fall at home, LKN was 9AM. Found to have left upper and lower extremity weakness, decreased sensation, and right sided gaze preference. Found to have an M1  occlusion on the CTA. She had tPA at 1204 with little improvement in symptoms. Patient was intubated and taken for an thrombectomy s/p complete revascularization of right M1.  R MCA stroke s/p TPA (1/9 at 1204) and thrombectomy w/complete revascularization of R M1  Follow-up imaging stable P:  Further neurodiagnostics per neuro team  Supportive care  Rehab focus will need PT OT and SLP eval following extubation  Eventually resume antiplatelets once we feel assured hemoglobin stable  BP goals per neurology   Respiratory insufficiency in the post-operative setting. Portable chest x-ray personally reviewed: Endotracheal tubes in satisfactory position.  Right greater than left airspace disease. -Sputum culture negative to date -Procalcitonin not impressive -Passing spontaneous breathing trial Plan Extubate to nasal cannula Discontinue sedation Wean oxygen Aspiration precautions Consider Lasix when hemodynamically can tolerate Follow-up sputum culture, holding off on antibiotics for now  Hemorrhagic shock, this seems to be resolved.  Suspect she had some retroperitoneal bleeding.  Now more likely drug-related hypotension History of HFrEF:  - TTE 1/9 with improved LVEF from 35-40% in 2016 to now 45-50%, improved PH, PAP 37 mmHg, diffuse hypokinesis in inferior and septum  P:  Holding Lasix Wean Neo-Synephrine Discontinue fentanyl Supportive care  Depression & Dementia: -Baseline dementia, she lives at home and is able to perform all ADLs, she cares for husband and prepares food. She does have some cognitive issues that limits her ability to do her bills and drive.  Plan Holding Zoloft for now  AKI, exacerbated by urinary retention and further exacerbated by hypotension on 1/10 secondary to volume depletion and hemorrhagic shock -Renal function has declined since yesterday, urine output low Plan Keep Foley catheter in place for now Renal dose medications Avoid  hypotension  Hyperchloremic metabolic acidosis -Her chloride continues to rise Plan KVO IVF We will consider free water replacement at some point, however if able to take p.o.'s this should regulate her sodium and chloride imbalances A.m. chemistry  Atrial fibrillation w/ intermittent RVR  On Plavix at home P:  PRN Lopressor Telemetry monitoring  Mild leukocytosis without fever P: Trend fever curve and white blood cell curve  Best practice:  Diet: NPO Pain/Anxiety/Delirium protocol (if indicated): Fentanyl and propofol PRN VAP protocol (if indicated): Yes DVT prophylaxis: SCDs, s/p alteplase GI prophylaxis: Protonix Glucose control: Monitor CBCs Mobility: Bedbound Code Status: full Family Communication: No family currently at bedside on am rounds.  Disposition: ICU, she remains critically ill secondary to respiratory failure requiring weaning of mechanical ventilation.  Hemoglobin seems to have been stabilized.  We are working on extubation today, will discontinue sedating medications.  Also she is having intermittent runs of paroxysmal atrial fibrillation with rapid ventricular response.  Once we are sure her hemoglobin has stabilized for at least 48 hours we should discuss systemic anticoagulation.  Although I am not sure she is a good candidate for this  Critical care time: 35 minutes  Erick Colace ACNP-BC [place] Pager # [phone] OR # [phone] if no answer

## 2018-10-24 NOTE — Procedures (Signed)
Extubation Procedure Note  Patient Details:   Name: Diane Mejia DOB: 11-22-1922 MRN: [id number]   Airway Documentation:    Vent end date: 10/24/18 Vent end time: 1058   Evaluation  O2 sats: stable throughout Complications: No apparent complications Patient did not tolerate procedure well. Bilateral Breath Sounds: Clear, Diminished   No.  Extubated pt per Dr.'s orders to 4L [place].  Lavalle Skoda V 10/24/2018, 11:01 AM

## 2018-10-25 ENCOUNTER — Inpatient Hospital Stay (HOSPITAL_COMMUNITY): Payer: Medicare Other

## 2018-10-25 DIAGNOSIS — I5021 Acute systolic (congestive) heart failure: Secondary | ICD-10-CM

## 2018-10-25 LAB — CBC
HCT: 31.4 % — ABNORMAL LOW (ref 36.0–46.0)
HEMOGLOBIN: 9.7 g/dL — AB (ref 12.0–15.0)
MCH: 31.9 pg (ref 26.0–34.0)
MCHC: 30.9 g/dL (ref 30.0–36.0)
MCV: 103.3 fL — ABNORMAL HIGH (ref 80.0–100.0)
Platelets: 214 10*3/uL (ref 150–400)
RBC: 3.04 MIL/uL — ABNORMAL LOW (ref 3.87–5.11)
RDW: 15.8 % — ABNORMAL HIGH (ref 11.5–15.5)
WBC: 14.3 10*3/uL — ABNORMAL HIGH (ref 4.0–10.5)
nRBC: 0.9 % — ABNORMAL HIGH (ref 0.0–0.2)

## 2018-10-25 LAB — GLUCOSE, CAPILLARY
Glucose-Capillary: 98 mg/dL (ref 70–99)
Glucose-Capillary: 95 mg/dL (ref 70–99)
Glucose-Capillary: 85 mg/dL (ref 70–99)
Glucose-Capillary: 75 mg/dL (ref 70–99)
Glucose-Capillary: 100 mg/dL — ABNORMAL HIGH (ref 70–99)
Glucose-Capillary: 60 mg/dL — ABNORMAL LOW (ref 70–99)
Glucose-Capillary: 75 mg/dL (ref 70–99)

## 2018-10-25 LAB — COMPREHENSIVE METABOLIC PANEL
ALK PHOS: 63 U/L (ref 38–126)
ALT: 135 U/L — ABNORMAL HIGH (ref 0–44)
AST: 337 U/L — ABNORMAL HIGH (ref 15–41)
Albumin: 2.3 g/dL — ABNORMAL LOW (ref 3.5–5.0)
Anion gap: 9 (ref 5–15)
BUN: 25 mg/dL — ABNORMAL HIGH (ref 8–23)
CO2: 13 mmol/L — ABNORMAL LOW (ref 22–32)
Calcium: 7.7 mg/dL — ABNORMAL LOW (ref 8.9–10.3)
Chloride: 122 mmol/L — ABNORMAL HIGH (ref 98–111)
Creatinine, Ser: 2.4 mg/dL — ABNORMAL HIGH (ref 0.44–1.00)
GFR calc Af Amer: 20 mL/min — ABNORMAL LOW (ref >60)
GFR calc non Af Amer: 17 mL/min — ABNORMAL LOW (ref >60)
Glucose, Bld: 101 mg/dL — ABNORMAL HIGH (ref 70–99)
Potassium: 4.3 mmol/L (ref 3.5–5.1)
Sodium: 144 mmol/L (ref 135–145)
Total Bilirubin: 0.9 mg/dL (ref 0.3–1.2)
Total Protein: 5.1 g/dL — ABNORMAL LOW (ref 6.5–8.1)

## 2018-10-25 LAB — LACTIC ACID, PLASMA: Lactic Acid, Venous: 2.5 mmol/L (ref 0.5–1.9)

## 2018-10-25 MED ORDER — LACTATED RINGERS IV SOLN
INTRAVENOUS | Status: DC
  Administered 2018-10-25 – 2018-10-28 (×2): via INTRAVENOUS

## 2018-10-25 MED ORDER — SODIUM CHLORIDE 0.9 % IV SOLN: INTRAVENOUS | Status: DC

## 2018-10-25 MED ORDER — PIPERACILLIN-TAZOBACTAM IN DEX 2-0.25 GM/50ML IV SOLN
2.2500 g | Freq: Three times a day (TID) | INTRAVENOUS | Status: DC
  Administered 2018-10-25 – 2018-10-26 (×3): 2.25 g via INTRAVENOUS
  Filled 2018-10-25 (×4): qty 50

## 2018-10-25 MED ORDER — DEXTROSE 50 % IV SOLN
1.0000 | Freq: Once | INTRAVENOUS | Status: AC
  Administered 2018-10-25: 25 mL via INTRAVENOUS
  Filled 2018-10-25: qty 50

## 2018-10-25 NOTE — Progress Notes (Signed)
Long discussion with the patient's DSS guardian Ms. Ouida Sills, also included her husband, daughter, son, and granddaughter after our discussion with DSS.  The patient as noted before was admitted with an acute CVA.  She has underlying dementia.  She was successfully extubated yesterday on 1/11, however she has had persistent decline since time of extubation.  Over the course of the evening of work of breathing has gotten worse, she has coarse upper airway sounds and is having difficulty with upper airway maintenance.  I am concerned she is having recurrent aspiration events, also concerned about evolving Sirs/sepsis physiology.  Given her underlying comorbidities, on top of acute stroke, I think reintubation and ACLS with her advanced age as well would not yield a satisfactory result.  We had a long discussion about this, and family as well as DSS guardian all agreed to DO NOT RESUSCITATE status. Plan at this time: Continuing supportive care This would include supplemental oxygen, IV antibiotics, rehabilitative services including nutritional support. If she were to develop respiratory arrest we would not intubate her.  If she were to develop worsening respiratory status, family and her DSS guardian would be in support of transitioning to comfort care.  Erick Colace ACNP-BC [place] Pager # [phone] OR # [phone] if no answer

## 2018-10-25 NOTE — Evaluation (Addendum)
Occupational Therapy Evaluation Patient Details Name: Diane Mejia MRN: [id number] DOB: 04-23-1923 Today's Date: 10/25/2018    History of Present Illness Pt is a 85 y/o female with hx of dementia, a fib, HTN, CHF, and depression presenting with a fall, L sided weakness, R gaze and L neglect.  MRI reveals: small multifocal areas of acute nonhemorrhagic infarcts in the R hemisphere.  TPA administered 1/9. IR with TICI3 reperfusion, 1/9. Intubated 1/9, extubated 1/12.     Clinical Impression   Patient supine in bed, cleared for participation.  Family present and supportive throughout session. Family reports PTA using RW and independent with basic ADLs. Patient requires total assist +2 for repositioning in bed, requires total assist +2 assist for all self care and bed mobility at this time.  Presents with impaired cognition, able to spontaneously move B UEs throughout session with inconsistent following of commands to squeeze therapists hands bilaterally, maintains eyes closed.  B UE PROM WFL, L sided response to noxious stimuli inconsistent (response to thumb but not to pinky).  Will continue to follow, but anticipate patient will best benefit from SNF level rehab at discharge. Will update recommendations as needed.     Follow Up Recommendations  SNF;Supervision/Assistance - 24 hour    Equipment Recommendations  Other (comment)(TBD)    Recommendations for Other Services Other (comment)(palliative care)     Precautions / Restrictions Precautions Precautions: Fall Restrictions Weight Bearing Restrictions: No      Mobility Bed Mobility Overal bed mobility: Needs Assistance             General bed mobility comments: total assist +2 to scoot up in bed to improve positoining   Transfers                 General transfer comment: deferred due to safety     Balance                                           ADL either performed or assessed with clinical  judgement   ADL Overall ADL's : Needs assistance/impaired Eating/Feeding: NPO   Grooming: Total assistance;Bed level   Upper Body Bathing: Total assistance;Bed level   Lower Body Bathing: Total assistance;Bed level                         General ADL Comments: at this time, patient is total assist bed level self care; limited eval     Vision   Vision Assessment?: Vision impaired- to be further tested in functional context Additional Comments: difficult to assess, maintains eyes closed throughout session     Perception     Praxis      Pertinent Vitals/Pain Pain Assessment: Faces Faces Pain Scale: No hurt     Hand Dominance     Extremity/Trunk Assessment Upper Extremity Assessment Upper Extremity Assessment: RUE deficits/detail;LUE deficits/detail RUE Deficits / Details: spontaneous movements, non functional; grasp grossly 3+/5, edema  LUE Deficits / Details: sponanteous movements, grasp 3+/5 grossly, edema, WFL PROM, inconsistent response to sensory testing    Lower Extremity Assessment Lower Extremity Assessment: Defer to PT evaluation       Communication Communication Communication: Receptive difficulties;Expressive difficulties   Cognition Arousal/Alertness: Lethargic Behavior During Therapy: Flat affect;Restless Overall Cognitive Status: Difficult to assess  General Comments: minimal interactions, able to follow simple commands inconsistently (squeeze hands) but limited assessment    General Comments  family present and supportive, educated family on positoining in bed for comfort and edema reduction of UEs    Exercises     Shoulder Instructions      Home Living Family/patient expects to be discharged to:: Private residence Living Arrangements: Spouse/significant other Available Help at Discharge: Family Type of Home: House Home Access: Stairs to enter Technical brewer of Steps: 3   Home  Layout: One level     Bathroom Shower/Tub: Other (comment)(basin bathing only)         Home Equipment: Walker - 2 wheels          Prior Functioning/Environment Level of Independence: Independent with assistive device(s)        Comments: family reports using RW for mobility, independent basic self care only        OT Problem List: Decreased strength;Decreased range of motion;Decreased activity tolerance;Impaired balance (sitting and/or standing);Decreased coordination;Impaired vision/perception;Decreased cognition;Impaired sensation;Increased edema      OT Treatment/Interventions: Self-care/ADL training;DME and/or AE instruction;Therapeutic exercise;Manual therapy;Therapeutic activities;Balance training;Patient/family education;Cognitive remediation/compensation;Visual/perceptual remediation/compensation    OT Goals(Current goals can be found in the care plan section) Acute Rehab OT Goals Patient Stated Goal: none stated Time For Goal Achievement: 11/08/18 Potential to Achieve Goals: Fair  OT Frequency: Min 2X/week   Barriers to D/C:            Co-evaluation              AM-PAC OT "6 Clicks" Daily Activity     Outcome Measure Help from another person eating meals?: Total Help from another person taking care of personal grooming?: Total Help from another person toileting, which includes using toliet, bedpan, or urinal?: Total Help from another person bathing (including washing, rinsing, drying)?: Total Help from another person to put on and taking off regular upper body clothing?: Total Help from another person to put on and taking off regular lower body clothing?: Total 6 Click Score: 6   End of Session Equipment Utilized During Treatment: Oxygen Nurse Communication: Mobility status  Activity Tolerance: Patient limited by lethargy Patient left: in bed;with bed alarm set;with call bell/phone within reach;with family/visitor present;Other (comment)(MD in  room)  OT Visit Diagnosis: Other abnormalities of gait and mobility (R26.89);Other symptoms and signs involving cognitive function;Muscle weakness (generalized) (M62.81);Other symptoms and signs involving the nervous system (R29.898)                Time: 2376-2831 OT Time Calculation (min): 15 min Charges:  OT General Charges $OT Visit: 1 Visit OT Evaluation $OT Eval High Complexity: 1 High  Delight Stare, OT Acute Rehabilitation Services Pager [phone] Office [phone]   Delight Stare 10/25/2018, 5:13 PM

## 2018-10-25 NOTE — Progress Notes (Signed)
CRITICAL VALUE ALERT  Critical Value:  Lactic acid 2.5  Date & Time Notied:  1/12 1125  Provider Notified: Salvadore Dom NP  Orders Received/Actions taken: no new orders at this time

## 2018-10-25 NOTE — Progress Notes (Signed)
STROKE TEAM PROGRESS NOTE   INTERVAL HISTORY Her RN is at the bedside. Pt was extubated and tolerating well. More awake alert today, Still has secretions deep in throat. Still on Neo with low BP on monitoring. Afib RVR improved now HR 100s. Cre and AST/ALT worsened.   Vitals:   10/25/18 1000 10/25/18 1004 10/25/18 1015 10/25/18 1024  BP: (!) 93/47 (!) 122/104 (!) 161/101 (!) 106/56  Pulse: (!) 115 99 (!) 101 (!) 113  Resp: (!) 23 13 18 19   Temp:      TempSrc:      SpO2: 96% 99% 93% 95%  Weight:      Height:        CBC:  Recent Labs  Lab 11/05/2018 1144  10/24/18 0505 10/25/18 0553  WBC 8.9   < > 13.3* 14.3*  NEUTROABS 5.7  --   --   --   HGB 13.2   < > 10.3* 9.7*  HCT 44.4   < > 33.4* 31.4*  MCV 99.1   < > 101.2* 103.3*  PLT 259   < > 226 214   < > = values in this interval not displayed.    Basic Metabolic Panel:  Recent Labs  Lab 10/23/18 1101 10/24/18 0505 10/25/18 0553  NA  --  144 144  K  --  3.7 4.3  CL  --  120* 122*  CO2  --  15* 13*  GLUCOSE  --  97 101*  BUN  --  19 25*  CREATININE  --  1.90* 2.40*  CALCIUM  --  7.3* 7.7*  MG 1.3* 2.0  --   PHOS  --  4.3  --    Lipid Panel:     Component Value Date/Time   CHOL 127 10/23/2018 0550   TRIG 224 (H) 10/23/2018 0550   HDL 24 (L) 10/23/2018 0550   CHOLHDL 5.3 10/23/2018 0550   VLDL 45 (H) 10/23/2018 0550   LDLCALC 58 10/23/2018 0550   HgbA1c:  Lab Results  Component Value Date   HGBA1C 5.6 10/23/2018   Urine Drug Screen:     Component Value Date/Time   LABOPIA NONE DETECTED 12/23/2013 0032   COCAINSCRNUR NONE DETECTED 12/23/2013 0032   LABBENZ NONE DETECTED 12/23/2013 0032   AMPHETMU NONE DETECTED 12/23/2013 0032   THCU NONE DETECTED 12/23/2013 0032   LABBARB NONE DETECTED 12/23/2013 0032    Alcohol Level     Component Value Date/Time   ETH <11 12/22/2013 2315    IMAGING  Ct Angio Head W Or Wo Contrast Ct Angio Neck W Or Wo Contrast 10/29/2018 IMPRESSION:  Embolic occlusion of the  right M1 segment. Minimal atherosclerotic change at the carotid bifurcations without stenosis.  Pulmonary fibrotic pattern. Question 1 cm nodule right upper lobe.   Mr Brain Wo Contrast 10/23/2018 IMPRESSION:  Multifocal areas of acute nonhemorrhagic infarction involving the RIGHT hemisphere, much less than would be expected given the initial RIGHT M1 occlusion. No postprocedural/post treatment complications. Advanced atrophy with extensive small vessel disease.   Dg Chest Port 1 View 10/23/2018 IMPRESSION:  1. Central line with tip at the cavoatrial junction. No pneumothorax.  2. Interstitial edema bibasilar atelectasis.  3. Endotracheal tube unchanged.   Dg Chest Portable 1 View 11/03/2018 IMPRESSION:  Enlargement of cardiac silhouette. Diffuse interstitial infiltrates, potentially due to progression of chronic interstitial lung disease though can not exclude acute superimposed infiltrates at the lower lungs.   Ct Head Code Stroke Wo Contrast 11/05/2018   IMPRESSION:  1. Hyperdense right MCA suggesting thrombosis. CTA recommended for further evaluation  2. No evidence of acute infarct or hemorrhage  3. ASPECTS is 10   DSA 10/20/2018 Right MCA M1 segment occlusion s/p emergent mechanical thrombectomy achieving a TICI 3 revascularization 10/21/2018 by Dr. Estanislado Pandy.   Transthoracic Echocardiogram  Left ventricle: Diffuse hypokinesis worse in the inferior wall   and septum. The cavity size was normal. Wall thickness was   increased in a pattern of moderate LVH. Systolic function was   mildly reduced. The estimated ejection fraction was in the range   of 45% to 50%. Left ventricular diastolic function parameters   were normal. - Aortic valve: Sclerosis without stenosis. - Mitral valve: Calcified annulus. Moderately thickened leaflets .   There was mild regurgitation. - Left atrium: The atrium was mildly dilated. - Pulmonary arteries: PA peak pressure: 37 mm Hg (S).     PHYSICAL  EXAM  Temp:  [96.9 F (36.1 C)-97.7 F (36.5 C)] 97.2 F (36.2 C) (01/12 0800) Pulse Rate:  [86-140] 113 (01/12 1024) Resp:  [9-27] 19 (01/12 1024) BP: (61-161)/(30-107) 106/56 (01/12 1024) SpO2:  [88 %-100 %] 95 % (01/12 1024)  General - Well nourished, well developed, extubated and tolerate well  Ophthalmologic - fundi not visualized due to noncooperation.  Cardiovascular - irregularly irregular heart rate and rhythm with RVR.  Neuro - awake alert, but not following commands, eyes open, perseverated on her name "Diane Mejia" but not orientated, blinking to visual threat bilaterally but not consistent, able to attend both side, but incomplete on bilateral gaze. PERRL. Facial symmetric. Tongue midline in mouth. BUE symmetrical in strength, 3/5. BLE 3-/5 with pain stimulation. DTR 1+ and no babinski. Sensation, coordination and gait not tested.    ASSESSMENT/PLAN [name] is a 85 y.o. female with history of dementia, a. Fib ( plavix only, no OAC), HTN, CHF ( lasix and BB), depression ( prozac) presenting with a fall, left side weakness, right gaze and left neglect.  IV TPA 10/23/2018 Dr Aroor.  Stroke:  right MCA infarct due to right M1 occlusion s/p tPA and IR with TICI3 reperfusion, embolic secondary to a fib not on AC.   Code Stroke CT head: Hyperdense right MCA suggesting thrombosis.  CTA head & neck Embolic occlusion of the right M1 segment. Minimal atherosclerotic change at the carotid bifurcations without stenosis.  MRI small multifocal areas of acute nonhemorrhagic infarcts in the right hemisphere. No hemorrhagic transformation, cytotoxic edema or midline shift.  2D Echo  - EF 45 - 50 %. No cardiac source of emboli identified.   LDL 45  HgbA1c 5.8  lovenox for VTE prophylaxis  clopidogrel 75 mg daily prior to admission, now on ASA PR 300  Therapy recommendations:  Pending   Disposition:  Pending   Afib with RVR  Heart rate 100s today  Not on beta-blocker due  to hypotension  CCM on board  We will consider DOAC once patient more stabilized and no procedure planned  Hypotension . BP low at 90s . On Neo-Synephrine . Close monitoring . Avoid fluid overload . Long-term BP goal normotensive  AKI on CKD  Creatinine 1.82-1.5-1.9->2.4  On IV fluid with LR  BMP monitoring  CCM on board  Other Stroke Risk Factors  Advanced age  Obesity, Body mass index is 30.98 kg/m., recommend weight loss, diet and exercise as appropriate   Congestive heart failure  Other Active Problems  Dementia  Depression   Anemia 13.2-9.7-8.8-10.3-9.7  Leukocytosis -8.9-14.6-13.3-14.3  Question 1 cm nodule right upper lobe.     Hospital day # 3  This patient is critically ill and at significant risk of neurological worsening, death and care requires constant monitoring of vital signs, hemodynamics,respiratory and cardiac monitoring, extensive review of multiple databases, frequent neurological assessment, discussion with family, other specialists and medical decision making of high complexity.I have made any additions or clarifications directly to the above note.This critical care time does not reflect procedure time, or teaching time or supervisory time of PA/NP/Med Resident etc but could involve care discussion time.  I spent 40 minutes of neurocritical care time  in the care of  this patient.  I discussed with CCM PA.  Rosalin Hawking, MD PhD Stroke Neurology 10/25/2018 10:45 AM   To contact Stroke Continuity provider, please refer to [email or web address] After hours, contact General Neurology

## 2018-10-25 NOTE — Progress Notes (Signed)
Pharmacy Antibiotic Note  Diane Mejia is a 85 y.o. female admitted on 10/25/2018 with an acute stroke.  Pharmacy has been consulted for zosyn dosing. Pt is afebrile and WBC is elevated at 14.3. Scr is trending up to 2.4 and lactic acid is elevated.   Plan: Zosyn 2.25mg  IV Q8H F/u renal fxn, C&S, clinical status  Height: 5' 3.5" (161.3 cm) Weight: 177 lb 11.1 oz (80.6 kg) IBW/kg (Calculated) : 53.55  Temp (24hrs), Avg:97.4 F (36.3 C), Min:97 F (36.1 C), Max:97.7 F (36.5 C)  Recent Labs  Lab 10/21/2018 1144 11/06/2018 1146 10/23/18 0550 10/24/18 0505 10/25/18 0553 10/25/18 1121  WBC 8.9  --  14.6* 13.3* 14.3*  --   CREATININE 1.82* 1.70* 1.50* 1.90* 2.40*  --   LATICACIDVEN  --   --   --   --   --  2.5*    Estimated Creatinine Clearance: 14.9 mL/min (A) (by C-G formula based on SCr of 2.4 mg/dL (H)).    Allergies  Allergen Reactions  . Dilaudid [Hydromorphone Hcl] Itching  . Sulfa Antibiotics Hives, Itching and Rash    Antimicrobials this admission: Zosyn 1/12>>  Dose adjustments this admission: N/A  Microbiology results: Pending  Thank you for allowing pharmacy to be a part of this patient's care.  Rogan Ecklund, [name] 10/25/2018 12:36 PM

## 2018-10-25 NOTE — Progress Notes (Addendum)
NAME:  AHRIANA GUNKEL, MRN:  [id number], DOB:  1923/04/05, LOS: 3 ADMISSION DATE:  11/01/2018, CONSULTATION DATE:  11/01/2018 REFERRING MD:  Dr. Lita Mains, CHIEF COMPLAINT:  CVA   Brief History   This is a 85 year old female with a history of atrial fibrillation only on Plavix, HTN, HFrHF, depression, and dementia who came in after a fall at home, LKN was 9AM. Found to have left upper and lower extremity weakness, decreased sensation, and right sided gaze preference. Found to have an M1 occlusion on the CTA. She had tPA at 1204 with little improvement in symptoms. Patient was intubated and taken for an thrombectomy s/p complete revascularization.   Past Medical History   Dementia, hypertension, arthritis, CHF, depression  Significant Hospital Events   10/21/2018 > TPA/ Thrombectomy , intubated 1/10: Hemorrhagic shock presumed transient hemorrhage from cath site.  Hemoglobin stabilized weaning pressors 1/11 passing spontaneous breathing trial, extubated 1/12: Agitated.  Remains hypotensive.  Hemoglobin stable.  White blood cell count climbing.  Concern about ongoing aspiration with poor airway clearance.  Renal function continues to decline as does metabolic status Consults:  PCCM  Procedures:  10/16/2018 Thrombectomy w/ complete revascularization Right M1 1/9 ETT, removed 1/12 1/9 Left radial aline 1/10: Right IJ triple-lumen catheter placed  Significant Diagnostic Tests:  11/12/2018: CT head IMPRESSION: 1. Hyperdense right MCA suggesting thrombosis. CTA recommended for further evaluation 2. No evidence of acute infarct or hemorrhage 3. ASPECTS is 10  10/17/2018 CTA Head IMPRESSION: Embolic occlusion of the right M1 segment. Minimal atherosclerotic change at the carotid bifurcations without stenosis. Pulmonary fibrotic pattern.  Question 1 cm nodule right upper lobe.  1/9 TTE >> LVEF 45-50%  - Left ventricle: Diffuse hypokinesis worse in the inferior wall and septum. The cavity size was  normal. Wall thickness was increased in a pattern of moderate LVH. Systolic function was mildly reduced. The estimated ejection fraction was in the range of 45% to 50%. Left ventricular diastolic function parameters were normal. - Aortic valve: Sclerosis without stenosis. - Mitral valve: Calcified annulus. Moderately thickened leaflets.  There was mild regurgitation. - Left atrium: The atrium was mildly dilated. - Pulmonary arteries: PA peak pressure: 37 mm Hg (S).  Micro Data:  1/9 MRSA PCR >> neg 1/12 respiratory culture>>>  Antimicrobials:   1/9 Cefazolin preop  Interim history/subjective:  Increased work of breathing.  Remains hypotensive.  Renal function worse.  Urine output dropping.  Objective   Blood pressure (Abnormal) 122/104, pulse 99, temperature (Abnormal) 97.2 F (36.2 C), temperature source Axillary, resp. rate 13, height 5' 3.5" (1.613 m), weight 80.6 kg, SpO2 99 %.        Intake/Output Summary (Last 24 hours) at 10/25/2018 1017 Last data filed at 10/25/2018 1003 Gross per 24 hour  Intake 2512.07 ml  Output 60 ml  Net 2452.07 ml   Filed Weights   10/29/2018 1100 10/27/2018 1221  Weight: 80.6 kg 80.6 kg   Examination: General: This is an elderly 85 year old female she is currently restless, picking and agitated at times.  She is very hard of hearing at baseline HEENT coarse upper airway noises, very poor cough mechanics.  Pupils equal reactive Pulmonary: Coarse scattered rhonchi mild accessory use Cardiac: Atrial fibrillation on telemetry.  Occasional rapid ventricular response Abdomen: Soft, not tender. Extremities: Dependent edema, weeping extremities.  Warm pulses. Neuro: Very hard of hearing.  No clear focal deficit she is moving everything.  Unable to phonate.  Intermittently follows commands. GU: Decreased urine output  Resolved Hospital  Problem list   Hemorrhagic shock secondary to presumed retroperitoneal hemorrhage Assessment & Plan:  This is a  85 year old female with a history of atrial fibrillation only on Plavix, HTN, HFrHF, depression, and dementia who came in after a fall at home, LKN was 9AM. Found to have left upper and lower extremity weakness, decreased sensation, and right sided gaze preference. Found to have an M1 occlusion on the CTA. She had tPA at 1204 with little improvement in symptoms. Patient was intubated and taken for an thrombectomy s/p complete revascularization of right M1.  R MCA stroke s/p TPA (1/9 at 1204) and thrombectomy w/complete revascularization of R M1  Follow-up imaging stable P:  Placing feeding tube Aspirin daily Continuing rehab focus Blood pressure goals to keep normal blood pressure at this time, she is actually been hypotensive Additional neurodiagnostics per neuro team  Respiratory insufficiency in the post-operative setting. -Extubated on 1/11 -Very poor cough mechanics, concerned about aspiration, white blood cell count rising Plan Strict n.p.o.  Weaning oxygen for saturations greater than 92%  Chest x-ray today  Follow-up pending sputum We will reach out to family later this afternoon, I do not think she is a good candidate for reintubation given poor prognosis overall  Sirs with ongoing hypotension question sepsis, at risk for aspiration History of HFrEF:  - TTE 1/9 with improved LVEF from 35-40% in 2016 to now 45-50%, improved PH, PAP 37 mmHg, diffuse hypokinesis in inferior and septum  Hemoglobin is been stable since initial bleeding P:  Holding Lasix Gentle IV hydration Neo-Synephrine for systolic blood pressure greater than 100 Check lactic acid  Depression & Dementia: -Baseline dementia, she lives at home and is able to perform all ADLs, she cares for husband and prepares food. She does have some cognitive issues that limits her ability to do her bills and drive.   Plan Holding Zoloft for now  AKI, exacerbated by urinary retention and further exacerbated by hypotension on  1/10 secondary to volume depletion and hemorrhagic shock Renal output continues to decline, now with worsening non-anion gap metabolic acidosis in the setting of hyperchloremia Plan Keep Foley catheter in place for now Renal dose medications Avoid hypotension A.m. chemistry  Hyperchloremic metabolic acidosis -Her chloride continues to rise Plan Add lactated Ringer's Discontinue saline Not a dialysis candidate A.m. chemistry  Abnormal LFTs Likely shock liver from prior hypotension Plan Follow-up a.m.  Atrial fibrillation w/ intermittent RVR  On Plavix at home P:  Rate control Telemetry monitoring Question DOAC at some point if clinically improves  Mild leukocytosis without fever P: Continue to trend fever curve  Best practice:  Diet: NPO Pain/Anxiety/Delirium protocol (if indicated): Fentanyl and propofol PRN VAP protocol (if indicated): Yes DVT prophylaxis: SCDs, s/p alteplase GI prophylaxis: Protonix Glucose control: Monitor CBCs Mobility: Bedbound Code Status: full Family Communication: No family currently at bedside on am rounds.  Disposition: She remains critically ill with multiple issues requiring critical care services including worsening respiratory status and possible high risk for acute on chronic respiratory failure, requires airway maintenance and clearance.  Additionally worsening renal failure and ongoing shock.  She is at risk for sepsis from aspiration, we will going to repeat a chest x-ray today.  I do not think she is a good candidate for ongoing aggressive care at 85 years old with new comorbidities her risk of further decline is quite high we will reach out to family later this morning   Critical care time 35 minutes  Erick Colace ACNP-BC [employer]  Pulmonary/Critical Care Pager # [phone] OR # [phone] if no answer   Attending note: I have seen and examined the patient. History, labs and imaging reviewed.  Extubated yesterday.  Increased  work of breathing today with rhonchorous breath sounds and difficulty clearing secretions  Blood pressure 108/73, pulse 94, temperature (!) 97.3 F (36.3 C), temperature source Axillary, resp. rate (!) 24, height 5' 3.5" (1.613 m), weight 80.6 kg, SpO2 100 %. Gen:      Mild distress. HEENT:  EOMI, sclera anicteric Neck:     No masses; no thyromegaly Lungs:    Scattered rhonchi CV:         Regular rate and rhythm; no murmurs Abd:      + bowel sounds; soft, non-tender; no palpable masses, no distension Ext:    No edema; adequate peripheral perfusion Skin:      Warm and dry; no rash Neuro: Nonverbal, intermittently follows commands  Labs reviewed, significant for BUN/creatinine 25/2.4, AST 337, ALT 135, lactic acid 2.5 WBC 14.3, hemoglobin 9.7, platelets 214  Assessment/plan: 85 year old with multiple medical problems admitted with M1 occlusion status post TPA, thrombectomy  Right MCA stroke status post TPA, thrombectomy  Supportive care per neurology.  Respiratory insufficiency due to altered mental status Extubated yesterday Has difficulty clearing secretion and may be aspirating.  Concerned that respiratory status may worsen. Start Zosyn.  Paroxysmal atrial fibrillation Lopressor as needed  CODE STATUS Discussed with legal guardian.  Made DNR  The patient is critically ill with multiple organ systems failure and requires high complexity decision making for assessment and support, frequent evaluation and titration of therapies, application of advanced monitoring technologies and extensive interpretation of multiple databases.  Critical care time - 35 mins. This represents my time independent of the NPs time taking care of the pt.  Marshell Garfinkel MD [facility] 10/25/2018, 2:12 PM

## 2018-10-26 ENCOUNTER — Inpatient Hospital Stay (HOSPITAL_COMMUNITY): Payer: Medicare Other

## 2018-10-26 DIAGNOSIS — J181 Lobar pneumonia, unspecified organism: Secondary | ICD-10-CM

## 2018-10-26 DIAGNOSIS — K7200 Acute and subacute hepatic failure without coma: Secondary | ICD-10-CM

## 2018-10-26 DIAGNOSIS — I959 Hypotension, unspecified: Secondary | ICD-10-CM

## 2018-10-26 DIAGNOSIS — F0390 Unspecified dementia without behavioral disturbance: Secondary | ICD-10-CM

## 2018-10-26 LAB — CBC
HCT: 31.1 % — ABNORMAL LOW (ref 36.0–46.0)
Hemoglobin: 9.7 g/dL — ABNORMAL LOW (ref 12.0–15.0)
MCH: 32.1 pg (ref 26.0–34.0)
MCHC: 31.2 g/dL (ref 30.0–36.0)
MCV: 103 fL — ABNORMAL HIGH (ref 80.0–100.0)
PLATELETS: 167 10*3/uL (ref 150–400)
RBC: 3.02 MIL/uL — ABNORMAL LOW (ref 3.87–5.11)
RDW: 16.3 % — ABNORMAL HIGH (ref 11.5–15.5)
WBC: 15.9 10*3/uL — ABNORMAL HIGH (ref 4.0–10.5)
nRBC: 3 % — ABNORMAL HIGH (ref 0.0–0.2)

## 2018-10-26 LAB — POCT I-STAT 3, ART BLOOD GAS (G3+)
Acid-base deficit: 16 mmol/L — ABNORMAL HIGH (ref 0.0–2.0)
BICARBONATE: 9.9 mmol/L — AB (ref 20.0–28.0)
O2 Saturation: 98 %
TCO2: 11 mmol/L — ABNORMAL LOW (ref 22–32)
pCO2 arterial: 24.5 mmHg — ABNORMAL LOW (ref 32.0–48.0)
pH, Arterial: 7.214 — ABNORMAL LOW (ref 7.350–7.450)
pO2, Arterial: 118 mmHg — ABNORMAL HIGH (ref 83.0–108.0)

## 2018-10-26 LAB — COMPREHENSIVE METABOLIC PANEL
ALK PHOS: 95 U/L (ref 38–126)
ALT: 1023 U/L — ABNORMAL HIGH (ref 0–44)
AST: 3445 U/L — ABNORMAL HIGH (ref 15–41)
Albumin: 2.6 g/dL — ABNORMAL LOW (ref 3.5–5.0)
Anion gap: 10 (ref 5–15)
BUN: 35 mg/dL — ABNORMAL HIGH (ref 8–23)
CO2: 12 mmol/L — ABNORMAL LOW (ref 22–32)
Calcium: 7.8 mg/dL — ABNORMAL LOW (ref 8.9–10.3)
Chloride: 122 mmol/L — ABNORMAL HIGH (ref 98–111)
Creatinine, Ser: 3.11 mg/dL — ABNORMAL HIGH (ref 0.44–1.00)
GFR calc Af Amer: 14 mL/min — ABNORMAL LOW (ref >60)
GFR calc non Af Amer: 12 mL/min — ABNORMAL LOW (ref >60)
Glucose, Bld: 80 mg/dL (ref 70–99)
Potassium: 4.8 mmol/L (ref 3.5–5.1)
Sodium: 144 mmol/L (ref 135–145)
Total Bilirubin: 2.6 mg/dL — ABNORMAL HIGH (ref 0.3–1.2)
Total Protein: 5.5 g/dL — ABNORMAL LOW (ref 6.5–8.1)

## 2018-10-26 LAB — GLUCOSE, CAPILLARY
Glucose-Capillary: 72 mg/dL (ref 70–99)
Glucose-Capillary: 71 mg/dL (ref 70–99)

## 2018-10-26 LAB — LACTIC ACID, PLASMA: Lactic Acid, Venous: 3.3 mmol/L (ref 0.5–1.9)

## 2018-10-26 MED ORDER — ALBUTEROL SULFATE (2.5 MG/3ML) 0.083% IN NEBU
2.5000 mg | INHALATION_SOLUTION | RESPIRATORY_TRACT | Status: DC | PRN
  Administered 2018-10-26: 2.5 mg via RESPIRATORY_TRACT

## 2018-10-26 MED ORDER — FUROSEMIDE 10 MG/ML IJ SOLN
80.0000 mg | Freq: Once | INTRAMUSCULAR | Status: AC
  Administered 2018-10-26: 80 mg via INTRAVENOUS
  Filled 2018-10-26: qty 8

## 2018-10-26 MED ORDER — MORPHINE BOLUS VIA INFUSION
1.0000 mg | INTRAVENOUS | Status: DC | PRN
  Administered 2018-10-26 (×2): 1 mg via INTRAVENOUS
  Filled 2018-10-26: qty 2

## 2018-10-26 MED ORDER — ALBUTEROL SULFATE (2.5 MG/3ML) 0.083% IN NEBU
2.5000 mg | INHALATION_SOLUTION | RESPIRATORY_TRACT | Status: DC | PRN
  Administered 2018-10-26: 2.5 mg via RESPIRATORY_TRACT
  Filled 2018-10-26 (×2): qty 3

## 2018-10-26 MED ORDER — MORPHINE 100MG IN NS 100ML (1MG/ML) PREMIX INFUSION
0.0000 mg/h | INTRAVENOUS | Status: DC
  Administered 2018-10-26: 5 mg/h via INTRAVENOUS
  Administered 2018-10-27: 7 mg/h via INTRAVENOUS
  Administered 2018-10-27: 12 mg/h via INTRAVENOUS
  Administered 2018-10-27 – 2018-10-28 (×2): 15 mg/h via INTRAVENOUS
  Filled 2018-10-26 (×8): qty 100

## 2018-10-26 MED ORDER — PANTOPRAZOLE SODIUM 40 MG IV SOLR
40.0000 mg | INTRAVENOUS | Status: DC
  Administered 2018-10-26: 40 mg via INTRAVENOUS
  Filled 2018-10-26: qty 40

## 2018-10-26 NOTE — Progress Notes (Signed)
Marni Griffon, NP notified of lactate of 3.3. No new orders.

## 2018-10-26 NOTE — Progress Notes (Signed)
Nutrition Brief Note  Chart reviewed. Pt now transitioning to comfort care.  No further nutrition interventions warranted at this time.  Please re-consult as needed.   Cella Cappello RD, LDN, CNSC [phone] Pager [phone] After Hours Pager    

## 2018-10-26 NOTE — Progress Notes (Signed)
SLP Cancellation Note  Patient Details Name: Diane Mejia MRN: [id number] DOB: [birth year]   Cancelled treatment:        Spoke with Diane Mejia. Pt's prognosis guarded. May get Cortrak today. Defer swallow eval now and see how she does over vest 24-48 hours. Will follow.    Houston Siren 10/26/2018, 8:49 AM    Orbie Pyo Colvin Caroli.Ed Risk analyst [phone] Office [phone]

## 2018-10-26 NOTE — Progress Notes (Signed)
Cortrak Tube Team Note:  Consult received to place a Cortrak feeding tube.   Two Cortrak RDs attempted to place Cortrak tube several times but were unsuccessful. Cortrak tube taped at head of bed. If placement of Cortrak still desired, consult IR to place feeding tube under fluoro.    Gaynell Face, MS, RD, LDN Inpatient Clinical Dietitian Pager: [phone] Weekend/After Hours: [phone]

## 2018-10-26 NOTE — Progress Notes (Signed)
Patient noted to have wheezing when RN entered the room. RN auscultated lungs and wheezing heard throughout upper lobes. Dr Oletta Darter notified and new orders given. RN to continue to monitor

## 2018-10-26 NOTE — Progress Notes (Signed)
STROKE TEAM PROGRESS NOTE   INTERVAL HISTORY Her RN and RT are at the bedside. Pt still has respiratory distress but eyes open, not following commands. Kidney function continues to get worse and liver function deteriorated. LA up to 3.3 today. Continued leukocytosis. Still has afib RVR and on neo for hypotension. Pt now multi-organ failure, pending family discussion of GOC.   Vitals:   10/26/18 1000 10/26/18 1015 10/26/18 1030 10/26/18 1045  BP: (!) 98/36 102/74 92/77 (!) 67/37  Pulse: (!) 112 (!) 115 (!) 110 (!) 112  Resp: 17 17 (!) 25 16  Temp:      TempSrc:      SpO2: 100% 100% 100% 100%  Weight:      Height:        CBC:  Recent Labs  Lab 10/21/2018 1144  10/25/18 0553 10/26/18 0559  WBC 8.9   < > 14.3* 15.9*  NEUTROABS 5.7  --   --   --   HGB 13.2   < > 9.7* 9.7*  HCT 44.4   < > 31.4* 31.1*  MCV 99.1   < > 103.3* 103.0*  PLT 259   < > 214 167   < > = values in this interval not displayed.    Basic Metabolic Panel:  Recent Labs  Lab 10/23/18 1101 10/24/18 0505 10/25/18 0553 10/26/18 0559  NA  --  144 144 144  K  --  3.7 4.3 4.8  CL  --  120* 122* 122*  CO2  --  15* 13* 12*  GLUCOSE  --  97 101* 80  BUN  --  19 25* 35*  CREATININE  --  1.90* 2.40* 3.11*  CALCIUM  --  7.3* 7.7* 7.8*  MG 1.3* 2.0  --   --   PHOS  --  4.3  --   --    Lipid Panel:     Component Value Date/Time   CHOL 127 10/23/2018 0550   TRIG 224 (H) 10/23/2018 0550   HDL 24 (L) 10/23/2018 0550   CHOLHDL 5.3 10/23/2018 0550   VLDL 45 (H) 10/23/2018 0550   LDLCALC 58 10/23/2018 0550   HgbA1c:  Lab Results  Component Value Date   HGBA1C 5.6 10/23/2018   Urine Drug Screen:     Component Value Date/Time   LABOPIA NONE DETECTED 12/23/2013 0032   COCAINSCRNUR NONE DETECTED 12/23/2013 0032   LABBENZ NONE DETECTED 12/23/2013 0032   AMPHETMU NONE DETECTED 12/23/2013 0032   THCU NONE DETECTED 12/23/2013 0032   LABBARB NONE DETECTED 12/23/2013 0032    Alcohol Level     Component Value  Date/Time   ETH <11 12/22/2013 2315    IMAGING  Ct Angio Head W Or Wo Contrast Ct Angio Neck W Or Wo Contrast 10/17/2018 IMPRESSION:  Embolic occlusion of the right M1 segment. Minimal atherosclerotic change at the carotid bifurcations without stenosis.  Pulmonary fibrotic pattern. Question 1 cm nodule right upper lobe.   Mr Brain Wo Contrast 10/23/2018 IMPRESSION:  Multifocal areas of acute nonhemorrhagic infarction involving the RIGHT hemisphere, much less than would be expected given the initial RIGHT M1 occlusion. No postprocedural/post treatment complications. Advanced atrophy with extensive small vessel disease.   Dg Chest Port 1 View 10/23/2018 IMPRESSION:  1. Central line with tip at the cavoatrial junction. No pneumothorax.  2. Interstitial edema bibasilar atelectasis.  3. Endotracheal tube unchanged.   Dg Chest Portable 1 View 10/26/2018 IMPRESSION:  Enlargement of cardiac silhouette. Diffuse interstitial infiltrates, potentially due to  progression of chronic interstitial lung disease though can not exclude acute superimposed infiltrates at the lower lungs.   Ct Head Code Stroke Wo Contrast 10/19/2018   IMPRESSION:  1. Hyperdense right MCA suggesting thrombosis. CTA recommended for further evaluation  2. No evidence of acute infarct or hemorrhage  3. ASPECTS is 10   DSA 10/18/2018 Right MCA M1 segment occlusion s/p emergent mechanical thrombectomy achieving a TICI 3 revascularization 10/30/2018 by Dr. Estanislado Pandy.   Transthoracic Echocardiogram  Left ventricle: Diffuse hypokinesis worse in the inferior wall   and septum. The cavity size was normal. Wall thickness was   increased in a pattern of moderate LVH. Systolic function was   mildly reduced. The estimated ejection fraction was in the range   of 45% to 50%. Left ventricular diastolic function parameters   were normal. - Aortic valve: Sclerosis without stenosis. - Mitral valve: Calcified annulus. Moderately thickened  leaflets .   There was mild regurgitation. - Left atrium: The atrium was mildly dilated. - Pulmonary arteries: PA peak pressure: 37 mm Hg (S).     PHYSICAL EXAM  Temp:  [97.3 F (36.3 C)-98.4 F (36.9 C)] 98.4 F (36.9 C) (01/13 0800) Pulse Rate:  [90-129] 112 (01/13 1045) Resp:  [12-31] 16 (01/13 1045) BP: (61-211)/(34-161) 67/37 (01/13 1045) SpO2:  [84 %-100 %] 100 % (01/13 1045)  General - Well nourished, well developed, in mild respiratory failure  Ophthalmologic - fundi not visualized due to noncooperation.  Cardiovascular - irregularly irregular heart rate and rhythm with RVR.  Neuro - awake alert, but not following commands, eyes open, nonverbal today, blinking to visual threat bilaterally but not consistent, able to attend both side, but incomplete on bilateral gaze. PERRL. Facial symmetric. Tongue midline in mouth. BUE symmetrical in strength, 3/5. BLE 3-/5 with pain stimulation. DTR 1+ and no babinski. Sensation, coordination and gait not tested. Bilateral UEs swelling with severe ecchymosis.    ASSESSMENT/PLAN Ms. ANTWANETTE WESCHE is a 85 y.o. female with history of dementia, a. Fib ( plavix only, no OAC), HTN, CHF ( lasix and BB), depression ( prozac) presenting with a fall, left side weakness, right gaze and left neglect.  IV TPA 11/03/2018 Dr Aroor.  Stroke:  right MCA infarct due to right M1 occlusion s/p tPA and IR with TICI3 reperfusion, embolic secondary to a fib not on AC.   Code Stroke CT head: Hyperdense right MCA suggesting thrombosis.  CTA head & neck Embolic occlusion of the right M1 segment. Minimal atherosclerotic change at the carotid bifurcations without stenosis.  MRI small multifocal areas of acute nonhemorrhagic infarcts in the right hemisphere. No hemorrhagic transformation, cytotoxic edema or midline shift.  2D Echo  - EF 45 - 50 %. No cardiac source of emboli identified.   LDL 45  HgbA1c 5.8  lovenox for VTE prophylaxis  clopidogrel 75 mg  daily prior to admission, now on ASA PR 300  Therapy recommendations:  Pending   Disposition:  Pending, given multi-organ failure, pending family discussion about GOC.    Afib with RVR  Heart rate 100-140 today  Not on beta-blocker due to hypotension  CCM on board  Pending family discussion about GOC  Hypotension . BP low at 90s . On high dose Neo-Synephrine . Close monitoring . Avoid fluid overload . Long-term BP goal normotensive  AKI on CKD, worsening  Creatinine 1.82-1.5-1.9->2.4->3.11  On IV fluid with LR  BMP monitoring  CCM on board  Shock liver  AST [id number]  ALT [phone]  Likely due to hypotension and renal failure  CCM on board   Sepsis   WBC 13.3-14.3-15.9  LA 2.5-3.3  On zosyn  Afebrile   CXR right UL suspicious for PMA  Other Stroke Risk Factors  Advanced age  Obesity, Body mass index is 30.98 kg/m., recommend weight loss, diet and exercise as appropriate   Congestive heart failure  Other Active Problems  Dementia  Depression   Anemia 13.2-9.7-8.8-10.3-9.7-9.7  Question 1 cm nodule right upper lobe.     Hospital day # 4  This patient is critically ill and at significant risk of neurological worsening, death and care requires constant monitoring of vital signs, hemodynamics,respiratory and cardiac monitoring, extensive review of multiple databases, frequent neurological assessment, discussion with family, other specialists and medical decision making of high complexity. I spent 35 minutes of neurocritical care time  in the care of  this patient.   Rosalin Hawking, MD PhD Stroke Neurology 10/26/2018 11:41 AM   To contact Stroke Continuity provider, please refer to [email or web address] After hours, contact General Neurology

## 2018-10-26 NOTE — Progress Notes (Signed)
   10/26/18 1500  Clinical Encounter Type  Visited With Patient and family together  Visit Type Initial  Referral From Chaplain  Spiritual Encounters  Spiritual Needs Grief support;Prayer  Responded to Page for Notary. Patient family is at bedside and patient is on comfort care. Provided emotional and grief support to family. Doctor request notary and I informed our notary. Spiritual care personnel notarized Ward forms for Physician.

## 2018-10-26 NOTE — Progress Notes (Signed)
eLink Physician-Brief Progress Note Patient Name: Diane Mejia DOB: 1923-07-29 MRN: [id number]   Date of Service  10/26/2018  HPI/Events of Note  Patient starting to wheeze according to bedside nurse. LVEF = 45-50%. BP = 107/80. Sat = 100% and RR = 13. Creatinine = 2.40. Patient has not made much urine over the last several days if charting is correct.   eICU Interventions  Will order: 1. Portable CXR STAT. 2. Albuterol 2.5 mg via neb now and Q 3 hours PRN wheezing or SOB.  3. Lasix 80 mg IV X 1 now.      Intervention Category Major Interventions: Other:  Lysle Dingwall 10/26/2018, 2:29 AM

## 2018-10-26 NOTE — Progress Notes (Signed)
NAME:  Diane Mejia, MRN:  [id number], DOB:  1923-08-17, LOS: 4 ADMISSION DATE:  10/20/2018, CONSULTATION DATE:  10/30/2018 REFERRING MD:  Dr. Lita Mains, CHIEF COMPLAINT:  CVA   Brief History   This is a 85 year old female with a history of atrial fibrillation only on Plavix, HTN, HFrHF, depression, and dementia who came in after a fall at home, LKN was 9AM. Found to have left upper and lower extremity weakness, decreased sensation, and right sided gaze preference. Found to have an M1 occlusion on the CTA. She had tPA at 1204 with little improvement in symptoms. Patient was intubated and taken for an thrombectomy s/p complete revascularization.   Past Medical History   Dementia, hypertension, arthritis, CHF, depression  Significant Hospital Events   10/20/2018 > TPA/ Thrombectomy , intubated 1/10: Hemorrhagic shock presumed transient hemorrhage from cath site.  Hemoglobin stabilized weaning pressors 1/11 passing spontaneous breathing trial, extubated 1/12: Agitated.  Remains hypotensive.  Hemoglobin stable.  White blood cell count climbing.  Concern about ongoing aspiration with poor airway clearance.  Renal function continues to decline as does metabolic status.  Discussion with family.  Do not reintubate, no code. 1/13.  Progressive multiorgan failure remains on high-dose pressors renal function worse, liver function worse, respiratory status labored. Consults:  PCCM  Procedures:   Thrombectomy w/ complete revascularization Right M1 1/9 ETT, removed 1/12 1/9 Left radial aline 1/10: Right IJ triple-lumen catheter placed  Significant Diagnostic Tests:  10/17/2018: CT head IMPRESSION: 1. Hyperdense right MCA suggesting thrombosis. CTA recommended for further evaluation 2. No evidence of acute infarct or hemorrhage 3. ASPECTS is 10  10/27/2018 CTA Head IMPRESSION: Embolic occlusion of the right M1 segment. Minimal atherosclerotic change at the carotid bifurcations  without stenosis. Pulmonary fibrotic pattern.  Question 1 cm nodule right upper lobe.  1/9 TTE >> LVEF 45-50%  - Left ventricle: Diffuse hypokinesis worse in the inferior wall and septum. The cavity size was normal. Wall thickness was increased in a pattern of moderate LVH. Systolic function was mildly reduced. The estimated ejection fraction was in the range of 45% to 50%. Left ventricular diastolic function parameters were normal. - Aortic valve: Sclerosis without stenosis. - Mitral valve: Calcified annulus. Moderately thickened leaflets.  There was mild regurgitation. - Left atrium: The atrium was mildly dilated. - Pulmonary arteries: PA peak pressure: 37 mm Hg (S).  Micro Data:  1/9 MRSA PCR >> neg 1/12 respiratory culture>>>  Antimicrobials:   1/9 Cefazolin preop Zosyn 1/12>>> Interim history/subjective:  Urine output continues to diminish On very high-dose Neo-Synephrine All endorgan indices appear to be worsening  Objective   Blood pressure 102/67, pulse (Abnormal) 104, temperature 98.4 F (36.9 C), temperature source Axillary, resp. rate 16, height 5' 3.5" (1.613 m), weight 80.6 kg, SpO2 100 %.        Intake/Output Summary (Last 24 hours) at 10/26/2018 0903 Last data filed at 10/26/2018 0800 Gross per 24 hour  Intake 3697.32 ml  Output 85 ml  Net 3612.32 ml   Filed Weights   11/10/2018 1100 11/08/2018 1221  Weight: 80.6 kg 80.6 kg   Examination: General: This is a chronically ill-appearing 85 year old female she has labored respirations today, appears worse overall. HEENT normocephalic atraumatic mucous membranes are dry no jugular venous distention Pulmonary: Coarse scattered rhonchi throughout mild accessory use. Cardiac: Irregular tachcardia with atrial fibrillation noted on monitor Abdomen: Soft, not tender no organomegaly Extremities: Cool, weak pulses.  Anasarca, scattered areas of ecchymosis.  Upper extremities weeping Neuro: Very hard  of hearing not following  commands left-sided weakness  Resolved Hospital Problem list   Hemorrhagic shock secondary to presumed retroperitoneal hemorrhage Assessment & Plan:  This is a 85 year old female with a history of atrial fibrillation only on Plavix, HTN, HFrHF, depression, and dementia who came in after a fall at home, LKN was 9AM. Found to have left upper and lower extremity weakness, decreased sensation, and right sided gaze preference. Found to have an M1 occlusion on the CTA. She had tPA at 1204 with little improvement in symptoms. Patient was intubated and taken for an thrombectomy s/p complete revascularization of right M1.  R MCA stroke s/p TPA (1/9 at 1204) and thrombectomy w/complete revascularization of R M1  Follow-up imaging stable P:  Placing feeding tube Aspirin daily Continuing rehab focus Blood pressure goals to keep normal blood pressure at this time, she is actually been hypotensive Additional neurodiagnostics per neuro team  Respiratory insufficiency in the post-operative setting. -Extubated on 1/11 -Very poor cough mechanics, concerned about aspiration, white blood cell count rising -Portable chest x-ray personally reviewed: Demonstrates low lung volumes bilaterally new right upper lobe opacity Plan Supplemental oxygen N.p.o. placing core track Pulse oximetry Do not reintubate Not a BiPAP candidate  SIRS/Sepsis; septic shock 2/2 aspiration PNA  History of HFrEF:  - TTE 1/9 with improved LVEF from 35-40% in 2016 to now 45-50%, improved PH, PAP 37 mmHg, diffuse hypokinesis in inferior and septum  Hemoglobin is been stable since initial bleeding -volume status adequate  P:  Continuing to wean Neo-Synephrine for mean arterial pressure greater than 65 Hold diuresis Day #2 of Zosyn No further escalation  Depression & Dementia: -Baseline dementia, she lives at home and is able to perform all ADLs, she cares for husband and prepares food. She does have some cognitive issues that  limits her ability to do her bills and drive.   Plan Holding zoloft   AKI, exacerbated by urinary retention and further exacerbated by hypotension on 1/10 secondary to volume depletion and hemorrhagic shock Renal output continues to decline, now with worsening non-anion gap metabolic acidosis in the setting of hyperchloremia Plan Renal dose meds Cont MAP goal > 65 for now  Am chemistry   Both AG and NAG metabolic acidosis. Hyperchloremic and lactic acid.  -Her chloride continues to rise Plan Ck ABG Repeat LA Dc LR, change to Low dose bicarb gtt   Abnormal LFTs Likely shock liver from prior hypotension Plan Cont supportive care. Not a candidate for further interventions so no role for abd [place]   Atrial fibrillation w/ intermittent RVR  On Plavix at home P:  Rate control Tele Doubt she will be candidate for [facility] as progressing to MODS  Mild leukocytosis working dx of aspiration PNA  P: Trending fever curve Day 2 zosyn   Best practice:  Diet: NPO Pain/Anxiety/Delirium protocol (if indicated): Fentanyl and propofol PRN VAP protocol (if indicated): Yes DVT prophylaxis: SCDs, s/p alteplase GI prophylaxis: Protonix Glucose control: Monitor CBCs Mobility: Bedbound Code Status: full Family Communication: No family currently at bedside on am rounds.  Disposition: She remains critically ill due to progressive multiorgan failure most likely secondary to aspiration pneumonia and septic shock.  Long discussion with family on 1/12 discussing poor prognosis given advanced age and already multiple comorbidities.  They were all in agreement to no escalation of care, and DO NOT RESUSCITATE or intubate status.  For today we will check a repeat lactic acid, and arterial blood gas.  I think of her lactic  acid is continuing to climb especially in light of her other progressive organ failures we should work towards transitioning to comfort.  I prepared the family on 1/12 that I think her body will  declare itself over the next 2 to 3 days.  They are prepared that her chance of dying as a consequence of aspiration is quite high   My critical care time is 35 minutes  Erick Colace ACNP-BC [place] Pager # [phone] OR # [phone] if no answer

## 2018-10-26 NOTE — Progress Notes (Signed)
Long discussion with the patient's family, also spoke to the patient's advocate at the department of social services  She has progressively declined overnight, now in multiple organ failure with worsening acidosis, worsening lactate, worsening renal failure, and worsening liver failure.  She is on high-dose pressors.  Her work of breathing is worse.  I believe she is actively dying, and looks uncomfortable.  Based on this, we have agreed to transition to comfort care.  We will discontinue vasoactive drips, discontinue all therapies that do not contribute directly to comfort.  No further escalation.  As mentioned before full DO NOT RESUSCITATE and DO NOT INTUBATE.  Starting morphine drip for comfort.  Erick Colace ACNP-BC [place] Pager # [phone] OR # [phone] if no answer

## 2018-10-26 NOTE — Progress Notes (Signed)
PT Cancellation Note  Patient Details Name: Diane Mejia MRN: [id number] DOB: 09-Apr-1923   Cancelled Treatment:    Reason Eval/Treat Not Completed: Patient not medically ready(per RN pt potentially pursuing comfort care and requested hold to determine appropriateness for therapy)   Caitlyne Ingham B [name] 10/26/2018, 10:08 AM  Elwyn Reach, PT Acute Rehabilitation Services Pager: [phone] Office: [phone]

## 2018-10-27 DIAGNOSIS — I6601 Occlusion and stenosis of right middle cerebral artery: Secondary | ICD-10-CM | POA: Diagnosis present

## 2018-10-27 LAB — CULTURE, RESPIRATORY W GRAM STAIN

## 2018-10-27 LAB — CULTURE, RESPIRATORY

## 2018-10-27 MED ORDER — ACETAMINOPHEN 650 MG RE SUPP: 650.0000 mg | RECTAL | Status: DC | PRN

## 2018-10-27 MED ORDER — ACETAMINOPHEN 160 MG/5ML PO SOLN: 650.0000 mg | ORAL | Status: DC | PRN

## 2018-10-27 MED ORDER — SODIUM CHLORIDE 0.9 % IV SOLN: INTRAVENOUS | Status: DC

## 2018-10-27 MED ORDER — CLEVIDIPINE BUTYRATE 0.5 MG/ML IV EMUL
0.0000 mg/h | INTRAVENOUS | Status: DC
  Filled 2018-10-27: qty 50

## 2018-10-27 MED ORDER — ACETAMINOPHEN 325 MG PO TABS: 650.0000 mg | ORAL_TABLET | ORAL | Status: DC | PRN

## 2018-10-27 NOTE — Social Work (Signed)
CSW acknowledging consult for comfort care. Will follow for disposition should hospice services or residential hospice become appropriate.   Bernard Donahoo H Gizell Danser, LCSWA [facility] Social Work [phone]   

## 2018-10-27 NOTE — Progress Notes (Signed)
NAME:  Diane Mejia, MRN:  [id number], DOB:  1923-05-25, LOS: 28 ADMISSION DATE:  10/20/2018, CONSULTATION DATE:  10/20/2018 REFERRING MD:  Dr. Lita Mains, CHIEF COMPLAINT:  CVA   Brief History   This is a 85 year old female with a history of atrial fibrillation only on Plavix, HTN, HFrHF, depression, and dementia who came in after a fall at home, LKN was 9AM. Found to have left upper and lower extremity weakness, decreased sensation, and right sided gaze preference. Found to have an M1 occlusion on the CTA. She had tPA at 1204 with little improvement in symptoms. Patient was intubated and taken for an thrombectomy s/p complete revascularization.   Past Medical History   Dementia, hypertension, arthritis, CHF, depression  Significant Hospital Events   11/13/2018 > TPA/ Thrombectomy , intubated 1/10: Hemorrhagic shock presumed transient hemorrhage from cath site.  Hemoglobin stabilized weaning pressors 1/11 passing spontaneous breathing trial, extubated 1/12: Agitated.  Remains hypotensive.  Hemoglobin stable.  White blood cell count climbing.  Concern about ongoing aspiration with poor airway clearance.  Renal function continues to decline as does metabolic status.  Discussion with family.  Do not reintubate, no code. 1/13.  Progressive multiorgan failure remains on high-dose pressors renal function worse, liver function worse, respiratory status labored.Comfort Care initiated , morphine gtt Consults:  PCCM  Procedures:  11/05/2018 Thrombectomy w/ complete revascularization Right M1 1/9 ETT, removed 1/12 1/9 Left radial aline 1/10: Right IJ triple-lumen catheter placed  Significant Diagnostic Tests:  11/03/2018: CT head IMPRESSION: 1. Hyperdense right MCA suggesting thrombosis. CTA recommended for further evaluation 2. No evidence of acute infarct or hemorrhage 3. ASPECTS is 10  11/05/2018 CTA Head IMPRESSION: Embolic occlusion of the right M1 segment. Minimal atherosclerotic change at the  carotid bifurcations without stenosis. Pulmonary fibrotic pattern.  Question 1 cm nodule right upper lobe.  1/9 TTE >> LVEF 45-50%  - Left ventricle: Diffuse hypokinesis worse in the inferior wall and septum. The cavity size was normal. Wall thickness was increased in a pattern of moderate LVH. Systolic function was mildly reduced. The estimated ejection fraction was in the range of 45% to 50%. Left ventricular diastolic function parameters were normal. - Aortic valve: Sclerosis without stenosis. - Mitral valve: Calcified annulus. Moderately thickened leaflets.  There was mild regurgitation. - Left atrium: The atrium was mildly dilated. - Pulmonary arteries: PA peak pressure: 37 mm Hg (S).  Micro Data:  1/9 MRSA PCR >> neg 1/12 respiratory culture>>>  Antimicrobials:   1/9 Cefazolin preop Zosyn 1/12>>> Interim history/subjective:  Appears comfortable on morphine gtt , respirations are slowing, family is at bedside.   Objective   Blood pressure (!) 68/42, pulse (!) 110, temperature 98.2 F (36.8 C), temperature source Axillary, resp. rate 16, height 5' 3.5" (1.613 m), weight 80.6 kg, SpO2 (!) 88 %.        Intake/Output Summary (Last 24 hours) at 10/27/2018 0948 Last data filed at 10/27/2018 0600 Gross per 24 hour  Intake 863.88 ml  Output 0 ml  Net 863.88 ml   Filed Weights   10/24/2018 1100 11/01/2018 1221  Weight: 80.6 kg 80.6 kg   Examination: General: This is a chronically ill-appearing 85 year old female on morphine gtt, RR10 HEENT NCAT, mucous membranes are dry ,no jugular venous distention Pulmonary: Bilateral chest excursion, respirations are slow, breath sounds are coarse throughout, diminished per bases Cardiac: S1, S2, Irr, No RMG Abdomen: Soft, not tender no organomegaly, Obese Extremities: Cool, weak pulses.  Anasarca, scattered areas of ecchymosis.  Upper  extremities weeping Neuro: Sedated with morphine, not following commands, appears pain free  Resolved  Hospital Problem list   Hemorrhagic shock secondary to presumed retroperitoneal hemorrhage Assessment & Plan:  This is a 85 year old female with a history of atrial fibrillation only on Plavix, HTN, HFrHF, depression, and dementia who came in after a fall at home, LKN was 9AM. Found to have left upper and lower extremity weakness, decreased sensation, and right sided gaze preference. Found to have an M1 occlusion on the CTA. She had tPA at 1204 with little improvement in symptoms. Patient was intubated and taken for an thrombectomy s/p complete revascularization of right M1.She continued to decline post extubation and family chose comfort care 1/13.  Full Comfort Care/ DNR Plan Continue Morphine gtt Titrate for comfort Ongoing Family Support Unrestricted visitation Consider transfer out of ICU to Palliative Care Unit  Best practice:  Diet: NPO Pain/Anxiety/Delirium protocol (if indicated): Morphine gtt VAP protocol (if indicated): Yes DVT prophylaxis: SCDs, s/p alteplase GI prophylaxis: Protonix Glucose control: Monitor CBCs Mobility: Bedbound Code Status: full Family Communication: Family updated at bedside Disposition: She remains critically ill due to progressive multiorgan failure most likely secondary to aspiration pneumonia and septic shock.  Long discussion with family on 1/12 discussing poor prognosis given advanced age and already multiple comorbidities.  They were all in agreement to no escalation of care, and DO NOT RESUSCITATE or intubate status.  Comfort care was initiated 1/13. Pt. Appears comfortable on Morphine gtt. Family at bedside.  Magdalen Spatz, AGACNP-BC [place] Pager # [phone] Pager [phone] if no answer 10/27/2018 9:48 AM

## 2018-10-27 NOTE — Progress Notes (Signed)
STROKE TEAM PROGRESS NOTE   INTERVAL HISTORY Her RN and granddaughter are at the bedside. Pt in on morphine drip for comfort care. No distress, seems comfortable.   Vitals:   10/26/18 1800 10/26/18 1900 10/26/18 2000 10/27/18 0839  BP:    (!) 68/42  Pulse:      Resp: (!) 7 11 14 16   Temp:      TempSrc:      SpO2:      Weight:      Height:        CBC:  Recent Labs  Lab 11/08/2018 1144  10/25/18 0553 10/26/18 0559  WBC 8.9   < > 14.3* 15.9*  NEUTROABS 5.7  --   --   --   HGB 13.2   < > 9.7* 9.7*  HCT 44.4   < > 31.4* 31.1*  MCV 99.1   < > 103.3* 103.0*  PLT 259   < > 214 167   < > = values in this interval not displayed.    Basic Metabolic Panel:  Recent Labs  Lab 10/23/18 1101 10/24/18 0505 10/25/18 0553 10/26/18 0559  NA  --  144 144 144  K  --  3.7 4.3 4.8  CL  --  120* 122* 122*  CO2  --  15* 13* 12*  GLUCOSE  --  97 101* 80  BUN  --  19 25* 35*  CREATININE  --  1.90* 2.40* 3.11*  CALCIUM  --  7.3* 7.7* 7.8*  MG 1.3* 2.0  --   --   PHOS  --  4.3  --   --    Lipid Panel:     Component Value Date/Time   CHOL 127 10/23/2018 0550   TRIG 224 (H) 10/23/2018 0550   HDL 24 (L) 10/23/2018 0550   CHOLHDL 5.3 10/23/2018 0550   VLDL 45 (H) 10/23/2018 0550   LDLCALC 58 10/23/2018 0550   HgbA1c:  Lab Results  Component Value Date   HGBA1C 5.6 10/23/2018   Urine Drug Screen:     Component Value Date/Time   LABOPIA NONE DETECTED 12/23/2013 0032   COCAINSCRNUR NONE DETECTED 12/23/2013 0032   LABBENZ NONE DETECTED 12/23/2013 0032   AMPHETMU NONE DETECTED 12/23/2013 0032   THCU NONE DETECTED 12/23/2013 0032   LABBARB NONE DETECTED 12/23/2013 0032    Alcohol Level     Component Value Date/Time   ETH <11 12/22/2013 2315    IMAGING  Ct Angio Head W Or Wo Contrast Ct Angio Neck W Or Wo Contrast 11/13/2018 IMPRESSION:  Embolic occlusion of the right M1 segment. Minimal atherosclerotic change at the carotid bifurcations without stenosis.  Pulmonary fibrotic  pattern. Question 1 cm nodule right upper lobe.   Mr Brain Wo Contrast 10/23/2018 IMPRESSION:  Multifocal areas of acute nonhemorrhagic infarction involving the RIGHT hemisphere, much less than would be expected given the initial RIGHT M1 occlusion. No postprocedural/post treatment complications. Advanced atrophy with extensive small vessel disease.   Dg Chest Port 1 View 10/23/2018 IMPRESSION:  1. Central line with tip at the cavoatrial junction. No pneumothorax.  2. Interstitial edema bibasilar atelectasis.  3. Endotracheal tube unchanged.   Dg Chest Portable 1 View 10/14/2018 IMPRESSION:  Enlargement of cardiac silhouette. Diffuse interstitial infiltrates, potentially due to progression of chronic interstitial lung disease though can not exclude acute superimposed infiltrates at the lower lungs.   Ct Head Code Stroke Wo Contrast 10/19/2018   IMPRESSION:  1. Hyperdense right MCA suggesting thrombosis. CTA recommended for  further evaluation  2. No evidence of acute infarct or hemorrhage  3. ASPECTS is 10   DSA 10/25/2018 Right MCA M1 segment occlusion s/p emergent mechanical thrombectomy achieving a TICI 3 revascularization 11/01/2018 by Dr. Estanislado Pandy.   Transthoracic Echocardiogram  Left ventricle: Diffuse hypokinesis worse in the inferior wall   and septum. The cavity size was normal. Wall thickness was   increased in a pattern of moderate LVH. Systolic function was   mildly reduced. The estimated ejection fraction was in the range   of 45% to 50%. Left ventricular diastolic function parameters   were normal. - Aortic valve: Sclerosis without stenosis. - Mitral valve: Calcified annulus. Moderately thickened leaflets .   There was mild regurgitation. - Left atrium: The atrium was mildly dilated. - Pulmonary arteries: PA peak pressure: 37 mm Hg (S).     PHYSICAL EXAM  Temp:  [98.2 F (36.8 C)] 98.2 F (36.8 C) (01/13 1200) Pulse Rate:  [110] 110 (01/13 1215) Resp:  [6-20]  16 (01/14 0839) BP: (68-110)/(42-94) 68/42 (01/14 0839) SpO2:  [88 %-92 %] 88 % (01/13 1215)  General - Well nourished, well developed, not in respiratory distress  Ophthalmologic - fundi not visualized due to noncooperation.  Cardiovascular - irregularly irregular heart rate and rhythm with RVR.  Neuro - limited exam due to comfort care. Pt eyes closed, not following commands. No spontaneous movement in all extremities. Facial symmetrical. Tongue in mouth.     ASSESSMENT/PLAN Diane Mejia is a 85 y.o. female with history of dementia, a. Fib ( plavix only, no OAC), HTN, CHF ( lasix and BB), depression ( prozac) presenting with a fall, left side weakness, right gaze and left neglect.  IV TPA 10/20/2018 Dr Aroor.  Stroke:  right MCA infarct due to right M1 occlusion s/p tPA and IR with TICI3 reperfusion, embolic secondary to a fib not on AC.   Code Stroke CT head: Hyperdense right MCA suggesting thrombosis.  CTA head & neck Embolic occlusion of the right M1 segment. Minimal atherosclerotic change at the carotid bifurcations without stenosis.  MRI small multifocal areas of acute nonhemorrhagic infarcts in the right hemisphere. No hemorrhagic transformation, cytotoxic edema or midline shift.  2D Echo  - EF 45 - 50 %. No cardiac source of emboli identified.   LDL 45  HgbA1c 5.8  lovenox for VTE prophylaxis  clopidogrel 75 mg daily prior to admission, now on ASA PR 300  Disposition: comfort care at this time   Afib with RVR  HR 110-140  No distress on comfort care measures  Hypotension . BP low at 100s  AKI on CKD, worsening  Creatinine 1.82-1.5-1.9->2.4->3.11  Shock liver  AST [phone]  ALT [phone]  Likely due to hypotension and renal failure  Sepsis   WBC 13.3-14.3-15.9  LA 2.5-3.3  Afebrile   CXR right UL suspicious for PMA  Other Stroke Risk Factors  Advanced age  Obesity, Body mass index is 30.98 kg/m., recommend weight loss, diet and  exercise as appropriate   Congestive heart failure  Other Active Problems  Dementia  Depression   Anemia 13.2-9.7-8.8-10.3-9.7-9.7  Question 1 cm nodule right upper lobe.   Hospital day # 5   Rosalin Hawking, MD PhD Stroke Neurology 10/27/2018 11:55 AM   To contact Stroke Continuity provider, please refer to [email or web address] After hours, contact General Neurology

## 2018-10-28 ENCOUNTER — Encounter (HOSPITAL_COMMUNITY): Payer: Self-pay | Admitting: Interventional Radiology

## 2018-11-14 NOTE — Death Summary Note (Signed)
DEATH SUMMARY   Patient Details  Name: Diane Mejia MRN: [id number] DOB: 1923-09-15  Admission/Discharge Information   Admit Date:  06-Nov-2018  Date of Death: Date of Death: 2018/11/12  Time of Death: Time of Death: 1330  Length of Stay: Jan 02, 2023  Referring Physician: Patient, No Pcp Per   Reason(s) for Hospitalization  right MCA infarct   Diagnoses  Preliminary cause of death:   Stroke:  right MCA infarct due to right M1 occlusion s/p tPA and IR with TICI3 reperfusion, embolic secondary to a fib not on AC.   Secondary Diagnoses (including complications and co-morbidities):    Acute right arterial ischemic stroke, middle cerebral artery (MCA) (HCC)   Middle cerebral artery embolism, right   afib RVR   Sepsis   Shock liver   AKI on CKD   Leukocytosis    Hypotension   Obesity    Dementia    Depression    Brief Hospital Course (including significant findings, care, treatment, and services provided and events leading to death)   Diane Mejia is a 85 y.o. female with history of dementia, a. Fib ( plavix only, no OAC), HTN, CHF ( lasix and BB), depression ( prozac) presenting with a fall, left side weakness, right gaze and left neglect.  IV TPA 11/06/2018 Dr Aroor.  Stroke:  right MCA infarct due to right M1 occlusion s/p tPA and IR with TICI3 reperfusion, embolic secondary to a fib not on AC.   Code Stroke CT head: Hyperdense right MCA suggesting thrombosis.  CTA head & neck Embolic occlusion of the right M1 segment. Minimal atherosclerotic change at the carotid bifurcations without stenosis.  MRI small multifocal areas of acute nonhemorrhagic infarcts in the right hemisphere. No hemorrhagic transformation, cytotoxic edema or midline shift.  2D Echo  - EF 45 - 50 %. No cardiac source of emboli identified.   LDL 45  HgbA1c 5.8  lovenox for VTE prophylaxis  clopidogrel 75 mg daily prior to admission  Disposition: comfort care measures - pt passed away 11/12/2018 1330    Afib with RVR  HR 110-140  No distress on comfort care measures  Hypotension  BP low at 100s  AKI on CKD, worsening  Creatinine 1.82-1.5-1.9->2.4->3.11  Shock liver  AST [phone]  ALT [phone]  Likely due to hypotension and renal failure  Sepsis   WBC 13.3-14.3-15.9  LA 2.5-3.3  Afebrile   CXR right UL suspicious for PMA  Other Stroke Risk Factors  Advanced age  Obesity, Body mass index is 30.98 kg/m., recommend weight loss, diet and exercise as appropriate   Congestive heart failure  Other Active Problems  Dementia  Depression   Anemia 13.2-9.7-8.8-10.3-9.7-9.7  Question 1 cm nodule right upper lobe.    Pertinent Labs and Studies  Significant Diagnostic Studies Ct Angio Head W Or Wo Contrast  Result Date: 11/06/2018 CLINICAL DATA:  Acute presentation with left-sided weakness. Hyperdense right middle cerebral artery. EXAM: CT ANGIOGRAPHY HEAD AND NECK TECHNIQUE: Multidetector CT imaging of the head and neck was performed using the standard protocol during bolus administration of intravenous contrast. Multiplanar CT image reconstructions and MIPs were obtained to evaluate the vascular anatomy. Carotid stenosis measurements (when applicable) are obtained utilizing NASCET criteria, using the distal internal carotid diameter as the denominator. CONTRAST:  50mL ISOVUE-370 IOPAMIDOL (ISOVUE-370) INJECTION 76% COMPARISON:  Head CT earlier same day FINDINGS: CTA NECK FINDINGS Aortic arch: Aortic atherosclerosis. Brachiocephalic vessel origins widely patent. Right carotid system: Common carotid artery tortuous but widely patent  to the bifurcation. Soft and calcified plaque at the carotid bifurcation and ICA bulb. No ICA stenosis. Left carotid system: Common carotid artery widely patent to the bifurcation region. Minimal atherosclerotic plaque at the bifurcation. No ICA stenosis. Vertebral arteries: Both vertebral arteries are widely patent at their  origins and through the cervical region to the foramen magnum. Skeleton: Advanced cervical spondylosis. Other neck: No mass or lymphadenopathy. Upper chest: Pulmonary fibrotic markings. Pericardial effusion. Question 1 cm mass in the right upper lobe. Review of the MIP images confirms the above findings CTA HEAD FINDINGS Anterior circulation: Both internal carotid arteries are patent through the skull base and siphon regions. No stenosis. The left anterior and middle cerebral vessels are widely patent and normal. On the right, the anterior cerebral division is widely patent and normal. There is occlusion of the M1 segment due to an embolus immediately beyond its origin. Some distal vessel collateral opacification is noted. Posterior circulation: Both vertebral arteries are widely patent through the foramen magnum to the basilar. No basilar stenosis. Mild bulbous configuration of the distal basilar artery without a true berry aneurysm. Venous sinuses: Patent and normal. Anatomic variants: None significant. Delayed phase: No abnormal enhancement. Review of the MIP images confirms the above findings IMPRESSION: Embolic occlusion of the right M1 segment. Minimal atherosclerotic change at the carotid bifurcations without stenosis. These results were communicated to Dr. Lorraine Lax at 12:17 pmon 01/30/2020by text page via the [facility] messaging system. Pulmonary fibrotic pattern.  Question 1 cm nodule right upper lobe. Electronically Signed   By: Nelson Chimes M.D.   On: 11/11/2018 12:23   Ct Angio Neck W Or Wo Contrast  Result Date: 10/22/2018 CLINICAL DATA:  Acute presentation with left-sided weakness. Hyperdense right middle cerebral artery. EXAM: CT ANGIOGRAPHY HEAD AND NECK TECHNIQUE: Multidetector CT imaging of the head and neck was performed using the standard protocol during bolus administration of intravenous contrast. Multiplanar CT image reconstructions and MIPs were obtained to evaluate the vascular anatomy. Carotid  stenosis measurements (when applicable) are obtained utilizing NASCET criteria, using the distal internal carotid diameter as the denominator. CONTRAST:  51mL ISOVUE-370 IOPAMIDOL (ISOVUE-370) INJECTION 76% COMPARISON:  Head CT earlier same day FINDINGS: CTA NECK FINDINGS Aortic arch: Aortic atherosclerosis. Brachiocephalic vessel origins widely patent. Right carotid system: Common carotid artery tortuous but widely patent to the bifurcation. Soft and calcified plaque at the carotid bifurcation and ICA bulb. No ICA stenosis. Left carotid system: Common carotid artery widely patent to the bifurcation region. Minimal atherosclerotic plaque at the bifurcation. No ICA stenosis. Vertebral arteries: Both vertebral arteries are widely patent at their origins and through the cervical region to the foramen magnum. Skeleton: Advanced cervical spondylosis. Other neck: No mass or lymphadenopathy. Upper chest: Pulmonary fibrotic markings. Pericardial effusion. Question 1 cm mass in the right upper lobe. Review of the MIP images confirms the above findings CTA HEAD FINDINGS Anterior circulation: Both internal carotid arteries are patent through the skull base and siphon regions. No stenosis. The left anterior and middle cerebral vessels are widely patent and normal. On the right, the anterior cerebral division is widely patent and normal. There is occlusion of the M1 segment due to an embolus immediately beyond its origin. Some distal vessel collateral opacification is noted. Posterior circulation: Both vertebral arteries are widely patent through the foramen magnum to the basilar. No basilar stenosis. Mild bulbous configuration of the distal basilar artery without a true berry aneurysm. Venous sinuses: Patent and normal. Anatomic variants: None significant. Delayed phase: No  abnormal enhancement. Review of the MIP images confirms the above findings IMPRESSION: Embolic occlusion of the right M1 segment. Minimal atherosclerotic  change at the carotid bifurcations without stenosis. These results were communicated to Dr. Lorraine Lax at 12:17 pmon 01/22/2020by text page via the [facility] messaging system. Pulmonary fibrotic pattern.  Question 1 cm nodule right upper lobe. Electronically Signed   By: Nelson Chimes M.D.   On: 10/25/2018 12:23   Mr Brain Wo Contrast  Result Date: 10/23/2018 CLINICAL DATA:  Dementia, depression, and fall. RIGHT M1 occlusion. Status post intra-arterial tPA and clot retrieval. EXAM: MRI HEAD WITHOUT CONTRAST TECHNIQUE: Multiplanar, multiecho pulse sequences of the brain and surrounding structures were obtained without intravenous contrast. COMPARISON:  CTA head neck 11/07/2018. Cerebral angiogram with demonstration of TICI 3 revascularization RIGHT anterior circulation. FINDINGS: Brain: Multifocal areas of both focal and confluent restricted diffusion in the RIGHT hemisphere representing acute infarction, RIGHT MCA territory. These involve the RIGHT lentiform nucleus, RIGHT posterior caudate, RIGHT centrum semiovale, RIGHT external capsule, subinsular/insular cortex, and scattered areas of the RIGHT frontal, posterior frontal and temporal cortex/subcortical white matter. No similar findings on the LEFT or in the posterior circulation. Generalized atrophy. Extensive focal and confluent white matter disease. 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 No postprocedural hemorrhage. Vascular: Flow voids are patent, with specific attention to the RIGHT MCA. Skull and upper cervical spine: Unremarkable. Sinuses/Orbits: No acute findings. Other: None. IMPRESSION: Multifocal areas of acute nonhemorrhagic infarction involving the RIGHT hemisphere, much less than would be expected given the initial RIGHT M1 occlusion. See discussion above. No postprocedural/post treatment complications. Advanced atrophy with extensive small vessel disease. Electronically Signed   By: Staci Righter M.D.   On: 10/23/2018 11:37   [place]  Result Date: November 15, 2018 CLINICAL DATA:  New onset of dysarthria. Left facial weakness and left arm and leg weakness. Right gaze deviation. Occluded right middle cerebral artery M1 segment on the CT angiogram of the head and neck. EXAM: 1. EMERGENT LARGE VESSEL OCCLUSION THROMBOLYSIS (anterior CIRCULATION) COMPARISON:  CT angiogram of the head and neck of 10/22/2018. MEDICATIONS: Ancef 2 g IV antibiotic was administered within 1 hour of the procedure. ANESTHESIA/SEDATION: General anesthesia. CONTRAST:  Isovue 300 approximately 50 mL FLUOROSCOPY TIME:  Fluoroscopy Time 41 minutes 12 seconds (1791 mGy). COMPLICATIONS: None immediate. TECHNIQUE: Following a full explanation of the procedure along with the potential associated complications, an informed witnessed consent was obtained from the patient's son and husband. The risks of intracranial hemorrhage of 10%, worsening neurological deficit, ventilator dependency, death and inability to revascularize were all reviewed in detail with the patient's family. The patient was then put under general anesthesia by the Department of Anesthesiology at [facility]. The right groin was prepped and draped in the usual sterile fashion. Thereafter using modified Seldinger technique, transfemoral access into the right common femoral artery was obtained without difficulty. Over a 0.035 inch guidewire a 5 French Pinnacle sheath was inserted. Through this, and also over a 0.035 inch guidewire a 5 [place] JB 1 catheter was advanced to the aortic arch region and selectively positioned in the innominate artery and the right common carotid artery. FINDINGS: The innominate artery injection demonstrates the right subclavian artery and the right common carotid artery to be widely patent. The right vertebral artery origin is widely patent. The vessel is seen to opacify to the cranial skull base where it opacifies the right vertebrobasilar junction and the right posterior-inferior  cerebellar artery. The opacified portion of the basilar  artery on the lateral projection is widely patent. The right common carotid arteriogram demonstrates the right external carotid artery origin to be mildly narrowed. Its branches, otherwise, opacify normally. The right internal carotid artery at the bulb and just distal is widely patent. There is a U-shaped configuration to the junction of the proximal right cervical ICA and the middle segment without evidence of kinking. Distal to this the vessel is seen to opacify to the cranial skull base. The petrous, cavernous and supraclinoid segments are widely patent. The right anterior cerebral artery opacifies into the capillary and venous phases. The right middle cerebral artery mid M1 segment demonstrates a near complete occlusion at its distal 1/3 with occluded superior division. There is an early suggestion of recanalization of the inferior division. The delayed arterial phase demonstrates suggestion of retrograde collateral supply of the right MCA distribution from the pericallosal and callosal marginal branches of the right anterior cerebral artery. The venous phase demonstrates a dominant right transverse sinus sigmoid sinus, and also a high-riding jugular bulb associated with a diverticulum projecting superiorly, measuring 7.2 mm in its maximum height, associated with a daughter diverticulum just lateral to this measuring approximately 3.5 mm. PROCEDURE: The diagnostic JB 1 catheter in the right common carotid artery was exchanged over a 0.035 inch 300 cm Rosen exchange guidewire for an 8 French 55 cm Brite tip neurovascular sheath using biplane roadmap technique and constant fluoroscopic guidance. Good aspiration obtained from the side port of the neurovascular sheath. This was then connected to continuous heparinized saline infusion. Once there the [facility] exchange guidewire, an 85 cm 8 [place] FlowGate balloon guide catheter which had been prepped with 50%  contrast and 50% heparinized saline infusion was advanced and positioned just proximal to the right common carotid artery. The guidewire was removed. Good aspiration was obtained from the hub of the [facility] guide catheter. A gentle contrast injection demonstrated no evidence of spasms, dissections or of intraluminal filling defects. No change was noted in the intracranial circulation. The 8 [place] FlowGate guide catheter was advanced to the proximal 1/3 of the right internal carotid artery. A combination of a Catalyst 5 French 132 cm guide catheter inside of which was an 021 Trevo ProVue microcatheter was advanced over a 0.014 inch Softip Synchro micro guidewire to the distal end of the [facility] guide catheter. With the micro guidewire leading with a J-tip configuration, the combination was navigated using a torque device to the supraclinoid right ICA. The micro guidewire was then advanced through the occluded right middle cerebral artery into the dominant inferior division M2 M3 region followed by the microcatheter. The guidewire was removed. Good aspiration obtained from the hub of the microcatheter. A gentle contrast injection demonstrated free antegrade flow distally. This was then connected to continuous heparinized saline infusion. A 4 mm x 40 mm X Solitaire device was then advanced in a coaxial manner and with constant heparinized saline infusion using biplane roadmap technique and constant fluoroscopic guidance to the distal end of the microcatheter. The proximal and the distal landing zones were defined. The O ring on the delivery microcatheter was loosened. With slight forward gentle traction with the right hand on the delivery micro guidewire with the left hand the delivery microcatheter was retrieved unsheathing the retrieval device. A gentle control arteriogram through the 5 [place] Catalyst guide catheter which had now been advanced into the proximal right M1 segment revealed antegrade flow into the  dominant inferior division. A TICI 2b revascularization had been achieved. Proximal flow  arrest was then achieved by inflating the balloon of the [facility] guide catheter in the right internal carotid artery. Thereafter, the combination of the retrieval device, the microcatheter and the 5 [place] Catalyst guide catheter was retrieved and removed as constant aspiration was applied at the hub of the [facility] guide catheter with a 60 mL syringe, and with the Penumbra suction device at the hub of the Catalyst guide catheter. The combination was retrieved and removed. Moderate sized specks of clot were noted in the interstices of the retrieval device. The aspiration demonstrated no evidence of clot. Aspiration was continued as the balloon was deflated in the right internal carotid artery of the [facility] guide catheter. A gentle control arteriogram performed through the [facility] guide catheter in the right internal carotid artery demonstrated complete angiographic revascularization of the right M1 segment in the dominant inferior division. The smaller superior division appeared occluded with a nubbin of contrast. This prompted a second pass with the combination of the 132 Catalyst guide catheter with an 021 Trevo ProVue microcatheter advanced over a 0.014 inch micro guidewire to the right M1 segment. Thereafter using a torque device and biplane roadmap technique, the micro guidewire was gently advanced through the nubbin of the occluded superior division with mild-to-moderate resistance. The 5 [place] guide catheter was advanced to just proximal to the origin of the superior division. Thereafter, the wire was easily advanced into the M3 M4 regions followed by the microcatheter. However, there was buckling of the microcatheter in the M2 region. The 0.014 inch micro guidewire was replaced with a 016 inch Headliner micro guidewire. This was advanced to the M3 M4 regions followed by the microcatheter without difficulty. The  guidewire was removed. Good aspiration obtained from the hub of the microcatheter. The 4 mm x 40 mm Solitaire X device was then again advanced to the tip of the microcatheter. The device was again deployed as mentioned above this time with the landing zones being in the superior division M3 region and also the origin of the superior division. With proximal flow arrest, the combination of the retrieval device, the microcatheter and the 5 [place] Catalyst guide catheter were retrieved and removed as constant aspiration was applied at the hub of the [facility] guide catheter with a 60 ML syringe, and a Penumbra aspiration device at the hub of the Catalyst guide catheter. The combination was retrieved and removed. Constant aspiration applied at the hub of the [facility] guide catheter as flow arrest was reversed in the right internal carotid artery. This aspirate contained multiple chunks of 2-3 mm sized clots in addition to specks of clot in the interstices of the retrieval device. A gentle control arteriogram performed through the right internal carotid artery via the FlowGate guide catheter now demonstrated a TICI 3 revascularization. There was a mild blush in the region of the putamen at the caudate head of the perfusion of the level of the lenticulostriates. However, there was no mass effect, midline shift or extravasation. The [facility] guide catheter and the neurovascular sheath were then retrieved into the abdominal aorta and exchanged over a J-tip guidewire for an 8 French Pinnacle sheath. This in turn was then removed with the application of an Angio-Seal with hemostasis. The right groin appeared soft without evidence of a hematoma or bleeding. Distal pulses remained palpable in the dorsalis pedis bilaterally right greater than left, whilst both posterior tibials remained non Dopplerable also unchanged from prior to the procedure. A Dyna CT of the brain performed demonstrated no evidence  of mass effect, midline  shift or gross hemorrhage. The patient was left intubated and transferred to the neuro ICU for subsequent post revascularization follow-up and management. 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 PLAN: Follow up in the clinic 4 weeks post discharge. Electronically Signed   By: Luanne Bras M.D.   On: 10/27/2018 22:03   Dg Chest Port 1 View  Result Date: 10/26/2018 CLINICAL DATA:  Wheezing EXAM: PORTABLE CHEST 1 VIEW COMPARISON:  10/25/2018 FINDINGS: Low lung volumes. Mild right upper lobe/midlung opacity, suspicious for pneumonia. Mild left lower lobe opacity, atelectasis versus pneumonia. The heart is normal in size. Right IJ venous catheter terminates in the upper right atrium. IMPRESSION: Low lung volumes. Mild right upper lobe opacity, suspicious for pneumonia. Mild left lower lobe opacity, atelectasis versus pneumonia. Electronically Signed   By: Julian Hy M.D.   On: 10/26/2018 02:47   Dg Chest Port 1 View  Result Date: 10/25/2018 CLINICAL DATA:  Pneumonia EXAM: PORTABLE CHEST 1 VIEW COMPARISON:  10/23/2018 FINDINGS: Stable right suprahilar opacity, possibly reflecting infection versus mild interstitial edema. Mild patchy bibasilar opacities, atelectasis versus pneumonia. The heart is top-normal in size. No definite pleural effusions. No pneumothorax. Right IJ venous catheter terminates the cavoatrial junction. Interval extubation. IMPRESSION: Interval extubation. Stable right suprahilar opacity, reflecting infection versus mild interstitial edema. Stable bibasilar opacities, atelectasis versus pneumonia. Electronically Signed   By: Julian Hy M.D.   On: 10/25/2018 20:42   Dg Chest Port 1 View  Result Date: 10/23/2018 CLINICAL DATA:  Central line placement EXAM: PORTABLE CHEST 1 VIEW COMPARISON:  10/21/2018  FINDINGS: Endotracheal tube unchanged. Introduction of RIGHT central venous line with at the cavoatrial junction. No pneumothorax. Low lung volumes. Interstitial edema pattern noted. Bibasilar atelectasis. IMPRESSION: 1. Central line with tip at the cavoatrial junction. No pneumothorax. 2. Interstitial edema bibasilar atelectasis. 3. Endotracheal tube unchanged. Electronically Signed   By: Suzy Bouchard M.D.   On: 10/23/2018 13:28   Dg Chest Portable 1 View  Result Date: 11/10/2018 CLINICAL DATA:  Post intubation EXAM: PORTABLE CHEST 1 VIEW COMPARISON:  Portable exam 1516 hours FINDINGS: Tip of endotracheal tube projects 2.3 cm above carina. Enlargement of cardiac silhouette. Mediastinal contours and pulmonary vascularity normal. Large hiatal hernia seen on previous exam not demonstrated on current study. Atherosclerotic calcification aorta. Chronic interstitial disease changes again identified, progressive since prior study. Difficult to completely exclude superimposed acute infiltrates in the mid to lower lungs bilaterally though this could represent progression of chronic interstitial disease. No pleural effusion or pneumothorax. Bones demineralized. IMPRESSION: Enlargement of cardiac silhouette. Diffuse interstitial infiltrates, potentially due to progression of chronic interstitial lung disease though can not exclude acute superimposed infiltrates at the lower lungs. Electronically Signed   By: Lavonia Dana M.D.   On: 11/05/2018 15:27   Ir Percutaneous Art Thrombectomy/infusion Intracranial Inc Diag Angio  Result Date: 11/09/18 CLINICAL DATA:  New onset of dysarthria. Left facial weakness and left arm and leg weakness. Right gaze deviation. Occluded right middle cerebral artery M1 segment on the CT angiogram of the head and neck. EXAM: 1. EMERGENT LARGE VESSEL OCCLUSION THROMBOLYSIS (anterior CIRCULATION) COMPARISON:  CT angiogram of the head and neck of 11/06/2018. MEDICATIONS: Ancef 2 g IV antibiotic  was administered within 1 hour of the procedure. ANESTHESIA/SEDATION: General anesthesia. CONTRAST:  Isovue 300 approximately 50 mL FLUOROSCOPY  TIME:  Fluoroscopy Time 41 minutes 12 seconds (1791 mGy). COMPLICATIONS: None immediate. TECHNIQUE: Following a full explanation of the procedure along with the potential associated complications, an informed witnessed consent was obtained from the patient's son and husband. The risks of intracranial hemorrhage of 10%, worsening neurological deficit, ventilator dependency, death and inability to revascularize were all reviewed in detail with the patient's family. The patient was then put under general anesthesia by the Department of Anesthesiology at [facility]. The right groin was prepped and draped in the usual sterile fashion. Thereafter using modified Seldinger technique, transfemoral access into the right common femoral artery was obtained without difficulty. Over a 0.035 inch guidewire a 5 French Pinnacle sheath was inserted. Through this, and also over a 0.035 inch guidewire a 5 [place] JB 1 catheter was advanced to the aortic arch region and selectively positioned in the innominate artery and the right common carotid artery. FINDINGS: The innominate artery injection demonstrates the right subclavian artery and the right common carotid artery to be widely patent. The right vertebral artery origin is widely patent. The vessel is seen to opacify to the cranial skull base where it opacifies the right vertebrobasilar junction and the right posterior-inferior cerebellar artery. The opacified portion of the basilar artery on the lateral projection is widely patent. The right common carotid arteriogram demonstrates the right external carotid artery origin to be mildly narrowed. Its branches, otherwise, opacify normally. The right internal carotid artery at the bulb and just distal is widely patent. There is a U-shaped configuration to the junction of the proximal  right cervical ICA and the middle segment without evidence of kinking. Distal to this the vessel is seen to opacify to the cranial skull base. The petrous, cavernous and supraclinoid segments are widely patent. The right anterior cerebral artery opacifies into the capillary and venous phases. The right middle cerebral artery mid M1 segment demonstrates a near complete occlusion at its distal 1/3 with occluded superior division. There is an early suggestion of recanalization of the inferior division. The delayed arterial phase demonstrates suggestion of retrograde collateral supply of the right MCA distribution from the pericallosal and callosal marginal branches of the right anterior cerebral artery. The venous phase demonstrates a dominant right transverse sinus sigmoid sinus, and also a high-riding jugular bulb associated with a diverticulum projecting superiorly, measuring 7.2 mm in its maximum height, associated with a daughter diverticulum just lateral to this measuring approximately 3.5 mm. PROCEDURE: The diagnostic JB 1 catheter in the right common carotid artery was exchanged over a 0.035 inch 300 cm Rosen exchange guidewire for an 8 French 55 cm Brite tip neurovascular sheath using biplane roadmap technique and constant fluoroscopic guidance. Good aspiration obtained from the side port of the neurovascular sheath. This was then connected to continuous heparinized saline infusion. Once there the [facility] exchange guidewire, an 85 cm 8 [place] FlowGate balloon guide catheter which had been prepped with 50% contrast and 50% heparinized saline infusion was advanced and positioned just proximal to the right common carotid artery. The guidewire was removed. Good aspiration was obtained from the hub of the [facility] guide catheter. A gentle contrast injection demonstrated no evidence of spasms, dissections or of intraluminal filling defects. No change was noted in the intracranial circulation. The 8 [place] FlowGate  guide catheter was advanced to the proximal 1/3 of the right internal carotid artery. A combination of a Catalyst 5 French 132 cm guide catheter inside of which was an 021 Trevo ProVue microcatheter was advanced  over a 0.014 inch Softip Synchro micro guidewire to the distal end of the [facility] guide catheter. With the micro guidewire leading with a J-tip configuration, the combination was navigated using a torque device to the supraclinoid right ICA. The micro guidewire was then advanced through the occluded right middle cerebral artery into the dominant inferior division M2 M3 region followed by the microcatheter. The guidewire was removed. Good aspiration obtained from the hub of the microcatheter. A gentle contrast injection demonstrated free antegrade flow distally. This was then connected to continuous heparinized saline infusion. A 4 mm x 40 mm X Solitaire device was then advanced in a coaxial manner and with constant heparinized saline infusion using biplane roadmap technique and constant fluoroscopic guidance to the distal end of the microcatheter. The proximal and the distal landing zones were defined. The O ring on the delivery microcatheter was loosened. With slight forward gentle traction with the right hand on the delivery micro guidewire with the left hand the delivery microcatheter was retrieved unsheathing the retrieval device. A gentle control arteriogram through the 5 [place] Catalyst guide catheter which had now been advanced into the proximal right M1 segment revealed antegrade flow into the dominant inferior division. A TICI 2b revascularization had been achieved. Proximal flow arrest was then achieved by inflating the balloon of the [facility] guide catheter in the right internal carotid artery. Thereafter, the combination of the retrieval device, the microcatheter and the 5 [place] Catalyst guide catheter was retrieved and removed as constant aspiration was applied at the hub of the [facility] guide  catheter with a 60 mL syringe, and with the Penumbra suction device at the hub of the Catalyst guide catheter. The combination was retrieved and removed. Moderate sized specks of clot were noted in the interstices of the retrieval device. The aspiration demonstrated no evidence of clot. Aspiration was continued as the balloon was deflated in the right internal carotid artery of the [facility] guide catheter. A gentle control arteriogram performed through the [facility] guide catheter in the right internal carotid artery demonstrated complete angiographic revascularization of the right M1 segment in the dominant inferior division. The smaller superior division appeared occluded with a nubbin of contrast. This prompted a second pass with the combination of the 132 Catalyst guide catheter with an 021 Trevo ProVue microcatheter advanced over a 0.014 inch micro guidewire to the right M1 segment. Thereafter using a torque device and biplane roadmap technique, the micro guidewire was gently advanced through the nubbin of the occluded superior division with mild-to-moderate resistance. The 5 [place] guide catheter was advanced to just proximal to the origin of the superior division. Thereafter, the wire was easily advanced into the M3 M4 regions followed by the microcatheter. However, there was buckling of the microcatheter in the M2 region. The 0.014 inch micro guidewire was replaced with a 016 inch Headliner micro guidewire. This was advanced to the M3 M4 regions followed by the microcatheter without difficulty. The guidewire was removed. Good aspiration obtained from the hub of the microcatheter. The 4 mm x 40 mm Solitaire X device was then again advanced to the tip of the microcatheter. The device was again deployed as mentioned above this time with the landing zones being in the superior division M3 region and also the origin of the superior division. With proximal flow arrest, the combination of the retrieval device, the  microcatheter and the 5 [place] Catalyst guide catheter were retrieved and removed as constant aspiration was applied at the hub of the [employer]  guide catheter with a 60 ML syringe, and a Penumbra aspiration device at the hub of the Catalyst guide catheter. The combination was retrieved and removed. Constant aspiration applied at the hub of the [facility] guide catheter as flow arrest was reversed in the right internal carotid artery. This aspirate contained multiple chunks of 2-3 mm sized clots in addition to specks of clot in the interstices of the retrieval device. A gentle control arteriogram performed through the right internal carotid artery via the FlowGate guide catheter now demonstrated a TICI 3 revascularization. There was a mild blush in the region of the putamen at the caudate head of the perfusion of the level of the lenticulostriates. However, there was no mass effect, midline shift or extravasation. The [facility] guide catheter and the neurovascular sheath were then retrieved into the abdominal aorta and exchanged over a J-tip guidewire for an 8 French Pinnacle sheath. This in turn was then removed with the application of an Angio-Seal with hemostasis. The right groin appeared soft without evidence of a hematoma or bleeding. Distal pulses remained palpable in the dorsalis pedis bilaterally right greater than left, whilst both posterior tibials remained non Dopplerable also unchanged from prior to the procedure. A Dyna CT of the brain performed demonstrated no evidence of mass effect, midline shift or gross hemorrhage. The patient was left intubated and transferred to the neuro ICU for subsequent post revascularization follow-up and management. 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 PLAN: Follow up in the clinic 4 weeks post  discharge. Electronically Signed   By: Luanne Bras M.D.   On: 10/27/2018 22:03   Ct Head Code Stroke Wo Contrast  Addendum Date: 10/14/2018   ADDENDUM REPORT: 10/27/2018 11:59 ADDENDUM: These results were called by telephone at the time of interpretation on 10/27/2018 at 11:55 am to Dr. Lorraine Lax, who verbally acknowledged these results. Electronically Signed   By: Franchot Gallo M.D.   On: 10/17/2018 11:59   Result Date: 10/21/2018 CLINICAL DATA:  Code stroke.  Left-sided weakness EXAM: CT HEAD WITHOUT CONTRAST TECHNIQUE: Contiguous axial images were obtained from the base of the skull through the vertex without intravenous contrast. COMPARISON:  CT head 10/31/2015 FINDINGS: Brain: Moderate to advanced atrophy. Chronic white matter changes stable from the prior study and moderate in degree. Negative for acute infarct, hemorrhage, mass.  No midline shift. Vascular: Hyperdense right MCA may respect reflect thrombosis. CTA recommended. Skull: Negative Sinuses/Orbits: Mild mucosal edema left sphenoid sinus. Otherwise sinuses are clear. Negative orbit Other: None ASPECTS ([place] Stroke Program Early CT Score) - Ganglionic level infarction (caudate, lentiform nuclei, internal capsule, insula, M1-M3 cortex): 7 - Supraganglionic infarction (M4-M6 cortex): 3 Total score (0-10 with 10 being normal): 10 IMPRESSION: 1. Hyperdense right MCA suggesting thrombosis. CTA recommended for further evaluation 2. No evidence of acute infarct or hemorrhage 3. ASPECTS is 10 Electronically Signed: By: Franchot Gallo M.D. On: 11/03/2018 11:53   Ir Angio Vertebral Sel Subclavian Innominate Uni R Mod Sed  Result Date: November 22, 2018 CLINICAL DATA:  New onset of dysarthria. Left facial weakness and left arm and leg weakness. Right gaze deviation. Occluded right middle cerebral artery M1 segment on the CT angiogram of the head and neck. EXAM: 1. EMERGENT LARGE VESSEL OCCLUSION THROMBOLYSIS (anterior CIRCULATION) COMPARISON:  CT angiogram  of the head and neck of 10/19/2018. MEDICATIONS: Ancef 2 g IV  antibiotic was administered within 1 hour of the procedure. ANESTHESIA/SEDATION: General anesthesia. CONTRAST:  Isovue 300 approximately 50 mL FLUOROSCOPY TIME:  Fluoroscopy Time 41 minutes 12 seconds (1791 mGy). COMPLICATIONS: None immediate. TECHNIQUE: Following a full explanation of the procedure along with the potential associated complications, an informed witnessed consent was obtained from the patient's son and husband. The risks of intracranial hemorrhage of 10%, worsening neurological deficit, ventilator dependency, death and inability to revascularize were all reviewed in detail with the patient's family. The patient was then put under general anesthesia by the Department of Anesthesiology at [facility]. The right groin was prepped and draped in the usual sterile fashion. Thereafter using modified Seldinger technique, transfemoral access into the right common femoral artery was obtained without difficulty. Over a 0.035 inch guidewire a 5 French Pinnacle sheath was inserted. Through this, and also over a 0.035 inch guidewire a 5 [place] JB 1 catheter was advanced to the aortic arch region and selectively positioned in the innominate artery and the right common carotid artery. FINDINGS: The innominate artery injection demonstrates the right subclavian artery and the right common carotid artery to be widely patent. The right vertebral artery origin is widely patent. The vessel is seen to opacify to the cranial skull base where it opacifies the right vertebrobasilar junction and the right posterior-inferior cerebellar artery. The opacified portion of the basilar artery on the lateral projection is widely patent. The right common carotid arteriogram demonstrates the right external carotid artery origin to be mildly narrowed. Its branches, otherwise, opacify normally. The right internal carotid artery at the bulb and just distal is widely  patent. There is a U-shaped configuration to the junction of the proximal right cervical ICA and the middle segment without evidence of kinking. Distal to this the vessel is seen to opacify to the cranial skull base. The petrous, cavernous and supraclinoid segments are widely patent. The right anterior cerebral artery opacifies into the capillary and venous phases. The right middle cerebral artery mid M1 segment demonstrates a near complete occlusion at its distal 1/3 with occluded superior division. There is an early suggestion of recanalization of the inferior division. The delayed arterial phase demonstrates suggestion of retrograde collateral supply of the right MCA distribution from the pericallosal and callosal marginal branches of the right anterior cerebral artery. The venous phase demonstrates a dominant right transverse sinus sigmoid sinus, and also a high-riding jugular bulb associated with a diverticulum projecting superiorly, measuring 7.2 mm in its maximum height, associated with a daughter diverticulum just lateral to this measuring approximately 3.5 mm. PROCEDURE: The diagnostic JB 1 catheter in the right common carotid artery was exchanged over a 0.035 inch 300 cm Rosen exchange guidewire for an 8 French 55 cm Brite tip neurovascular sheath using biplane roadmap technique and constant fluoroscopic guidance. Good aspiration obtained from the side port of the neurovascular sheath. This was then connected to continuous heparinized saline infusion. Once there the [facility] exchange guidewire, an 85 cm 8 [place] FlowGate balloon guide catheter which had been prepped with 50% contrast and 50% heparinized saline infusion was advanced and positioned just proximal to the right common carotid artery. The guidewire was removed. Good aspiration was obtained from the hub of the [facility] guide catheter. A gentle contrast injection demonstrated no evidence of spasms, dissections or of intraluminal filling defects. No  change was noted in the intracranial circulation. The 8 [place] FlowGate guide catheter was advanced to the proximal 1/3 of the right internal carotid artery. A combination  of a Catalyst 5 French 132 cm guide catheter inside of which was an 021 Trevo ProVue microcatheter was advanced over a 0.014 inch Softip Synchro micro guidewire to the distal end of the [facility] guide catheter. With the micro guidewire leading with a J-tip configuration, the combination was navigated using a torque device to the supraclinoid right ICA. The micro guidewire was then advanced through the occluded right middle cerebral artery into the dominant inferior division M2 M3 region followed by the microcatheter. The guidewire was removed. Good aspiration obtained from the hub of the microcatheter. A gentle contrast injection demonstrated free antegrade flow distally. This was then connected to continuous heparinized saline infusion. A 4 mm x 40 mm X Solitaire device was then advanced in a coaxial manner and with constant heparinized saline infusion using biplane roadmap technique and constant fluoroscopic guidance to the distal end of the microcatheter. The proximal and the distal landing zones were defined. The O ring on the delivery microcatheter was loosened. With slight forward gentle traction with the right hand on the delivery micro guidewire with the left hand the delivery microcatheter was retrieved unsheathing the retrieval device. A gentle control arteriogram through the 5 [place] Catalyst guide catheter which had now been advanced into the proximal right M1 segment revealed antegrade flow into the dominant inferior division. A TICI 2b revascularization had been achieved. Proximal flow arrest was then achieved by inflating the balloon of the [facility] guide catheter in the right internal carotid artery. Thereafter, the combination of the retrieval device, the microcatheter and the 5 [place] Catalyst guide catheter was retrieved and  removed as constant aspiration was applied at the hub of the [facility] guide catheter with a 60 mL syringe, and with the Penumbra suction device at the hub of the Catalyst guide catheter. The combination was retrieved and removed. Moderate sized specks of clot were noted in the interstices of the retrieval device. The aspiration demonstrated no evidence of clot. Aspiration was continued as the balloon was deflated in the right internal carotid artery of the [facility] guide catheter. A gentle control arteriogram performed through the [facility] guide catheter in the right internal carotid artery demonstrated complete angiographic revascularization of the right M1 segment in the dominant inferior division. The smaller superior division appeared occluded with a nubbin of contrast. This prompted a second pass with the combination of the 132 Catalyst guide catheter with an 021 Trevo ProVue microcatheter advanced over a 0.014 inch micro guidewire to the right M1 segment. Thereafter using a torque device and biplane roadmap technique, the micro guidewire was gently advanced through the nubbin of the occluded superior division with mild-to-moderate resistance. The 5 [place] guide catheter was advanced to just proximal to the origin of the superior division. Thereafter, the wire was easily advanced into the M3 M4 regions followed by the microcatheter. However, there was buckling of the microcatheter in the M2 region. The 0.014 inch micro guidewire was replaced with a 016 inch Headliner micro guidewire. This was advanced to the M3 M4 regions followed by the microcatheter without difficulty. The guidewire was removed. Good aspiration obtained from the hub of the microcatheter. The 4 mm x 40 mm Solitaire X device was then again advanced to the tip of the microcatheter. The device was again deployed as mentioned above this time with the landing zones being in the superior division M3 region and also the origin of the superior  division. With proximal flow arrest, the combination of the retrieval device, the microcatheter and the  5 [place] Catalyst guide catheter were retrieved and removed as constant aspiration was applied at the hub of the [facility] guide catheter with a 60 ML syringe, and a Penumbra aspiration device at the hub of the Catalyst guide catheter. The combination was retrieved and removed. Constant aspiration applied at the hub of the [facility] guide catheter as flow arrest was reversed in the right internal carotid artery. This aspirate contained multiple chunks of 2-3 mm sized clots in addition to specks of clot in the interstices of the retrieval device. A gentle control arteriogram performed through the right internal carotid artery via the FlowGate guide catheter now demonstrated a TICI 3 revascularization. There was a mild blush in the region of the putamen at the caudate head of the perfusion of the level of the lenticulostriates. However, there was no mass effect, midline shift or extravasation. The [facility] guide catheter and the neurovascular sheath were then retrieved into the abdominal aorta and exchanged over a J-tip guidewire for an 8 French Pinnacle sheath. This in turn was then removed with the application of an Angio-Seal with hemostasis. The right groin appeared soft without evidence of a hematoma or bleeding. Distal pulses remained palpable in the dorsalis pedis bilaterally right greater than left, whilst both posterior tibials remained non Dopplerable also unchanged from prior to the procedure. A Dyna CT of the brain performed demonstrated no evidence of mass effect, midline shift or gross hemorrhage. The patient was left intubated and transferred to the neuro ICU for subsequent post revascularization follow-up and management. 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 PLAN: Follow up in the clinic 4 weeks post discharge. Electronically Signed   By: Luanne Bras M.D.   On: 10/27/2018 22:03    Microbiology Recent Results (from the past 240 hour(s))  MRSA PCR Screening     Status: None   Collection Time: 11/02/2018  3:16 PM  Result Value Ref Range Status   MRSA by PCR NEGATIVE NEGATIVE Final    Comment:        The GeneXpert MRSA Assay (FDA approved for NASAL specimens only), is one component of a comprehensive MRSA colonization surveillance program. It is not intended to diagnose MRSA infection nor to guide or monitor treatment for MRSA infections. Performed at [facility], [place] [address], [place], [place] [address]   Culture, respiratory (non-expectorated)     Status: None   Collection Time: 10/23/18 11:01 AM  Result Value Ref Range Status   Specimen Description TRACHEAL ASPIRATE  Final   Special Requests NONE  Final   Gram Stain   Final    RARE WBC PRESENT, PREDOMINANTLY PMN NO ORGANISMS SEEN Performed at [facility], [place] [address], [place], [place] [address]    Culture RARE CANDIDA ALBICANS  Final   Report Status 10/27/2018 FINAL  Final    Lab Basic Metabolic Panel: Recent Labs  Lab 11/02/2018 1144 11/08/2018 1146 10/23/18 0550 10/23/18 1101 10/24/18 0505 10/25/18 0553 10/26/18 0559  NA 138 140 141  --  144 144 144  K 3.7 3.7 3.4*  --  3.7 4.3 4.8  CL 105 103 116*  --  120* 122* 122*  CO2 22  --  17*  --  15* 13* 12*  GLUCOSE 134* 132* 106*  --  97 101* 80  BUN 19 22 18   --  19 25* 35*  CREATININE 1.82* 1.70* 1.50*  --  1.90* 2.40* 3.11*  CALCIUM 8.6*  --  7.1*  --  7.3* 7.7* 7.8*  MG  --   --   --  1.3* 2.0  --   --   PHOS  --   --   --   --  4.3  --   --    Liver Function Tests: Recent Labs  Lab 10/23/2018 1144 10/24/18 0505 10/25/18 0553 10/26/18 0559  AST 21  --  337* 3,445*  ALT 9  --  135* 1,023*  ALKPHOS 59  --  63 95  BILITOT 0.8  --  0.9 2.6*  PROT 6.8   --  5.1* 5.5*  ALBUMIN 3.0* 2.2* 2.3* 2.6*   No results for input(s): LIPASE, AMYLASE in the last 168 hours. No results for input(s): AMMONIA in the last 168 hours. CBC: Recent Labs  Lab 10/18/2018 1144  10/23/18 0550 10/23/18 1101 10/23/18 2115 10/24/18 0505 10/25/18 0553 10/26/18 0559  WBC 8.9  --  14.6*  --   --  13.3* 14.3* 15.9*  NEUTROABS 5.7  --   --   --   --   --   --   --   HGB 13.2   < > 9.7* 8.8* 10.9* 10.3* 9.7* 9.7*  HCT 44.4   < > 30.8* 29.0* 36.0 33.4* 31.4* 31.1*  MCV 99.1  --  99.0  --   --  101.2* 103.3* 103.0*  PLT 259  --  282  --   --  226 214 167   < > = values in this interval not displayed.   Cardiac Enzymes: No results for input(s): CKTOTAL, CKMB, CKMBINDEX, TROPONINI in the last 168 hours. Sepsis Labs: Recent Labs  Lab 10/23/18 0550 10/23/18 1101 10/24/18 0505 10/25/18 0553 10/25/18 1121 10/26/18 0559 10/26/18 0947  PROCALCITON  --  0.27 0.57  --   --   --   --   WBC 14.6*  --  13.3* 14.3*  --  15.9*  --   LATICACIDVEN  --   --   --   --  2.5*  --  3.3*    Procedures/Operations  Right MCA thrombectomy    Tsuneo Faison Nov 27, 2018, 2:30 PM

## 2018-11-14 NOTE — Progress Notes (Signed)
STROKE TEAM PROGRESS NOTE   INTERVAL HISTORY Her RN is at the bedside. Pt sill on morphine drip for comfort care. No distress, seems comfortable. Need SW work on residential hospice.   Vitals:   10/27/18 1600 10/27/18 1728 10/27/18 1729 10/27/18 1800  BP:  (!) 68/52 (!) 68/52   Pulse:      Resp: 13 (!) 9 11 10   Temp:      TempSrc:      SpO2:      Weight:      Height:        CBC:  Recent Labs  Lab 10/19/2018 1144  10/25/18 0553 10/26/18 0559  WBC 8.9   < > 14.3* 15.9*  NEUTROABS 5.7  --   --   --   HGB 13.2   < > 9.7* 9.7*  HCT 44.4   < > 31.4* 31.1*  MCV 99.1   < > 103.3* 103.0*  PLT 259   < > 214 167   < > = values in this interval not displayed.    Basic Metabolic Panel:  Recent Labs  Lab 10/23/18 1101 10/24/18 0505 10/25/18 0553 10/26/18 0559  NA  --  144 144 144  K  --  3.7 4.3 4.8  CL  --  120* 122* 122*  CO2  --  15* 13* 12*  GLUCOSE  --  97 101* 80  BUN  --  19 25* 35*  CREATININE  --  1.90* 2.40* 3.11*  CALCIUM  --  7.3* 7.7* 7.8*  MG 1.3* 2.0  --   --   PHOS  --  4.3  --   --    Lipid Panel:     Component Value Date/Time   CHOL 127 10/23/2018 0550   TRIG 224 (H) 10/23/2018 0550   HDL 24 (L) 10/23/2018 0550   CHOLHDL 5.3 10/23/2018 0550   VLDL 45 (H) 10/23/2018 0550   LDLCALC 58 10/23/2018 0550   HgbA1c:  Lab Results  Component Value Date   HGBA1C 5.6 10/23/2018   Urine Drug Screen:     Component Value Date/Time   LABOPIA NONE DETECTED 12/23/2013 0032   COCAINSCRNUR NONE DETECTED 12/23/2013 0032   LABBENZ NONE DETECTED 12/23/2013 0032   AMPHETMU NONE DETECTED 12/23/2013 0032   THCU NONE DETECTED 12/23/2013 0032   LABBARB NONE DETECTED 12/23/2013 0032    Alcohol Level     Component Value Date/Time   ETH <11 12/22/2013 2315    IMAGING  Ct Angio Head W Or Wo Contrast Ct Angio Neck W Or Wo Contrast 11/12/2018 IMPRESSION:  Embolic occlusion of the right M1 segment. Minimal atherosclerotic change at the carotid bifurcations without  stenosis.  Pulmonary fibrotic pattern. Question 1 cm nodule right upper lobe.   Mr Brain Wo Contrast 10/23/2018 IMPRESSION:  Multifocal areas of acute nonhemorrhagic infarction involving the RIGHT hemisphere, much less than would be expected given the initial RIGHT M1 occlusion. No postprocedural/post treatment complications. Advanced atrophy with extensive small vessel disease.   Dg Chest Port 1 View 10/23/2018 IMPRESSION:  1. Central line with tip at the cavoatrial junction. No pneumothorax.  2. Interstitial edema bibasilar atelectasis.  3. Endotracheal tube unchanged.   Dg Chest Portable 1 View 11/10/2018 IMPRESSION:  Enlargement of cardiac silhouette. Diffuse interstitial infiltrates, potentially due to progression of chronic interstitial lung disease though can not exclude acute superimposed infiltrates at the lower lungs.   Ct Head Code Stroke Wo Contrast 11/12/2018   IMPRESSION:  1. Hyperdense right MCA  suggesting thrombosis. CTA recommended for further evaluation  2. No evidence of acute infarct or hemorrhage  3. ASPECTS is 10   DSA 11/13/2018 Right MCA M1 segment occlusion s/p emergent mechanical thrombectomy achieving a TICI 3 revascularization 11/08/2018 by Dr. Estanislado Pandy.   Transthoracic Echocardiogram  Left ventricle: Diffuse hypokinesis worse in the inferior wall   and septum. The cavity size was normal. Wall thickness was   increased in a pattern of moderate LVH. Systolic function was   mildly reduced. The estimated ejection fraction was in the range   of 45% to 50%. Left ventricular diastolic function parameters   were normal. - Aortic valve: Sclerosis without stenosis. - Mitral valve: Calcified annulus. Moderately thickened leaflets .   There was mild regurgitation. - Left atrium: The atrium was mildly dilated. - Pulmonary arteries: PA peak pressure: 37 mm Hg (S).     PHYSICAL EXAM  Pulse Rate:  [74] 74 (01/14 1400) Resp:  [9-14] 10 (01/14 1800) BP: (68)/(52)  68/52 (01/14 1729) SpO2:  [69 %] 69 % (01/14 1400)  General - Well nourished, well developed, not in respiratory distress  Ophthalmologic - fundi not visualized due to noncooperation.  Cardiovascular - irregularly irregular heart rate and rhythm with RVR.  Neuro - limited exam due to comfort care. Pt eyes closed, not following commands. No spontaneous movement in all extremities. Facial symmetrical. Tongue in mouth.     ASSESSMENT/PLAN [name] is a 85 y.o. female with history of dementia, a. Fib ( plavix only, no OAC), HTN, CHF ( lasix and BB), depression ( prozac) presenting with a fall, left side weakness, right gaze and left neglect.  IV TPA 11/03/2018 Dr Aroor.  Stroke:  right MCA infarct due to right M1 occlusion s/p tPA and IR with TICI3 reperfusion, embolic secondary to a fib not on AC.   Code Stroke CT head: Hyperdense right MCA suggesting thrombosis.  CTA head & neck Embolic occlusion of the right M1 segment. Minimal atherosclerotic change at the carotid bifurcations without stenosis.  MRI small multifocal areas of acute nonhemorrhagic infarcts in the right hemisphere. No hemorrhagic transformation, cytotoxic edema or midline shift.  2D Echo  - EF 45 - 50 %. No cardiac source of emboli identified.   LDL 45  HgbA1c 5.8  lovenox for VTE prophylaxis  clopidogrel 75 mg daily prior to admission  Disposition: comfort care at this time, will need SW to work on residential hospice.   Afib with RVR  HR 110-140  No distress on comfort care measures  Hypotension . BP low at 100s  AKI on CKD, worsening  Creatinine 1.82-1.5-1.9->2.4->3.11  Shock liver  AST [phone]  ALT [phone]  Likely due to hypotension and renal failure  Sepsis   WBC 13.3-14.3-15.9  LA 2.5-3.3  Afebrile   CXR right UL suspicious for PMA  Other Stroke Risk Factors  Advanced age  Obesity, Body mass index is 30.98 kg/m., recommend weight loss, diet and exercise as  appropriate   Congestive heart failure  Other Active Problems  Dementia  Depression   Anemia 13.2-9.7-8.8-10.3-9.7-9.7  Question 1 cm nodule right upper lobe.   Hospital day # 6   Rosalin Hawking, MD PhD Stroke Neurology 11-25-2018 10:23 AM   To contact Stroke Continuity provider, please refer to [email or web address] After hours, contact General Neurology

## 2018-11-14 NOTE — Social Work (Addendum)
2:21pm- Alerted that pt has passed.   2:17pm- Received verbal hand off from [place], [place] ICU CSW that pt had been referred to [place].   Aware pt has a legal guardian through [facility]. Kristopher Glee, [phone].  Westley Hummer, MSW, [place] Work [phone]

## 2018-11-14 DEATH — deceased
